# Patient Record
Sex: Female | Born: 1955
Health system: Southern US, Community
[De-identification: ages and names within clinical notes are randomized; demographics above are authoritative.]

## PROBLEM LIST (undated history)

## (undated) DIAGNOSIS — K449 Diaphragmatic hernia without obstruction or gangrene: Secondary | ICD-10-CM

## (undated) DIAGNOSIS — Z87442 Personal history of urinary calculi: Secondary | ICD-10-CM

## (undated) DIAGNOSIS — R011 Cardiac murmur, unspecified: Secondary | ICD-10-CM

## (undated) DIAGNOSIS — F32A Depression, unspecified: Secondary | ICD-10-CM

## (undated) DIAGNOSIS — T7840XA Allergy, unspecified, initial encounter: Secondary | ICD-10-CM

## (undated) DIAGNOSIS — R112 Nausea with vomiting, unspecified: Secondary | ICD-10-CM

## (undated) DIAGNOSIS — T8859XA Other complications of anesthesia, initial encounter: Secondary | ICD-10-CM

## (undated) DIAGNOSIS — M069 Rheumatoid arthritis, unspecified: Secondary | ICD-10-CM

## (undated) DIAGNOSIS — M3500 Sicca syndrome, unspecified: Secondary | ICD-10-CM

## (undated) DIAGNOSIS — E785 Hyperlipidemia, unspecified: Secondary | ICD-10-CM

## (undated) DIAGNOSIS — K219 Gastro-esophageal reflux disease without esophagitis: Secondary | ICD-10-CM

## (undated) DIAGNOSIS — I2510 Atherosclerotic heart disease of native coronary artery without angina pectoris: Secondary | ICD-10-CM

## (undated) DIAGNOSIS — I700 Atherosclerosis of aorta: Secondary | ICD-10-CM

## (undated) DIAGNOSIS — F419 Anxiety disorder, unspecified: Secondary | ICD-10-CM

## (undated) DIAGNOSIS — D649 Anemia, unspecified: Secondary | ICD-10-CM

## (undated) DIAGNOSIS — M1990 Unspecified osteoarthritis, unspecified site: Secondary | ICD-10-CM

## (undated) DIAGNOSIS — M797 Fibromyalgia: Secondary | ICD-10-CM

## (undated) DIAGNOSIS — I10 Essential (primary) hypertension: Secondary | ICD-10-CM

## (undated) DIAGNOSIS — T4145XA Adverse effect of unspecified anesthetic, initial encounter: Secondary | ICD-10-CM

## (undated) DIAGNOSIS — F329 Major depressive disorder, single episode, unspecified: Secondary | ICD-10-CM

## (undated) DIAGNOSIS — Z9889 Other specified postprocedural states: Secondary | ICD-10-CM

## (undated) HISTORY — DX: Anxiety disorder, unspecified: F41.9

## (undated) HISTORY — PX: TONSILLECTOMY: SUR1361

## (undated) HISTORY — DX: Allergy, unspecified, initial encounter: T78.40XA

## (undated) HISTORY — DX: Essential (primary) hypertension: I10

## (undated) HISTORY — DX: Hyperlipidemia, unspecified: E78.5

## (undated) HISTORY — DX: Atherosclerosis of aorta: I70.0

## (undated) HISTORY — DX: Diaphragmatic hernia without obstruction or gangrene: K44.9

## (undated) HISTORY — DX: Sjogren syndrome, unspecified: M35.00

## (undated) HISTORY — DX: Rheumatoid arthritis, unspecified: M06.9

## (undated) HISTORY — DX: Atherosclerotic heart disease of native coronary artery without angina pectoris: I25.10

## (undated) HISTORY — PX: CARDIAC CATHETERIZATION: SHX172

## (undated) HISTORY — PX: ABDOMINAL HYSTERECTOMY: SHX81

## (undated) HISTORY — PX: HERNIA REPAIR: SHX51

## (undated) HISTORY — PX: OOPHORECTOMY: SHX86

## (undated) HISTORY — PX: VESICOVAGINAL FISTULA CLOSURE W/ TAH: SUR271

---

## 2005-02-15 ENCOUNTER — Ambulatory Visit: Payer: Self-pay | Admitting: Gastroenterology

## 2005-05-05 ENCOUNTER — Ambulatory Visit: Payer: Self-pay | Admitting: Internal Medicine

## 2006-05-18 ENCOUNTER — Ambulatory Visit: Payer: Self-pay | Admitting: Internal Medicine

## 2007-12-04 ENCOUNTER — Encounter: Payer: Self-pay | Admitting: Emergency Medicine

## 2007-12-04 ENCOUNTER — Observation Stay (HOSPITAL_COMMUNITY): Admission: AD | Admit: 2007-12-04 | Discharge: 2007-12-05 | Payer: Self-pay | Admitting: Internal Medicine

## 2007-12-10 ENCOUNTER — Ambulatory Visit: Payer: Self-pay | Admitting: Cardiovascular Disease

## 2008-01-18 DIAGNOSIS — R011 Cardiac murmur, unspecified: Secondary | ICD-10-CM

## 2008-01-18 HISTORY — DX: Cardiac murmur, unspecified: R01.1

## 2008-03-16 ENCOUNTER — Observation Stay: Payer: Self-pay | Admitting: Internal Medicine

## 2008-08-26 ENCOUNTER — Ambulatory Visit (HOSPITAL_COMMUNITY): Admission: RE | Admit: 2008-08-26 | Discharge: 2008-08-26 | Payer: Self-pay | Admitting: Gastroenterology

## 2009-03-18 ENCOUNTER — Encounter: Admission: RE | Admit: 2009-03-18 | Discharge: 2009-03-18 | Payer: Self-pay | Admitting: Internal Medicine

## 2009-07-17 ENCOUNTER — Ambulatory Visit: Payer: Self-pay | Admitting: Family Medicine

## 2009-07-17 ENCOUNTER — Ambulatory Visit: Payer: Self-pay | Admitting: Cardiology

## 2009-07-17 DIAGNOSIS — F411 Generalized anxiety disorder: Secondary | ICD-10-CM | POA: Insufficient documentation

## 2009-07-17 DIAGNOSIS — F419 Anxiety disorder, unspecified: Secondary | ICD-10-CM | POA: Insufficient documentation

## 2009-07-17 DIAGNOSIS — K449 Diaphragmatic hernia without obstruction or gangrene: Secondary | ICD-10-CM | POA: Insufficient documentation

## 2009-07-17 DIAGNOSIS — IMO0002 Reserved for concepts with insufficient information to code with codable children: Secondary | ICD-10-CM

## 2009-07-17 DIAGNOSIS — M3500 Sicca syndrome, unspecified: Secondary | ICD-10-CM | POA: Insufficient documentation

## 2009-07-17 DIAGNOSIS — I10 Essential (primary) hypertension: Secondary | ICD-10-CM | POA: Insufficient documentation

## 2009-07-17 DIAGNOSIS — E785 Hyperlipidemia, unspecified: Secondary | ICD-10-CM

## 2009-07-17 DIAGNOSIS — M069 Rheumatoid arthritis, unspecified: Secondary | ICD-10-CM | POA: Insufficient documentation

## 2009-07-17 DIAGNOSIS — R439 Unspecified disturbances of smell and taste: Secondary | ICD-10-CM

## 2009-07-21 ENCOUNTER — Telehealth (INDEPENDENT_AMBULATORY_CARE_PROVIDER_SITE_OTHER): Payer: Self-pay | Admitting: *Deleted

## 2009-07-21 DIAGNOSIS — K7689 Other specified diseases of liver: Secondary | ICD-10-CM | POA: Insufficient documentation

## 2009-07-24 ENCOUNTER — Encounter: Admission: RE | Admit: 2009-07-24 | Discharge: 2009-07-24 | Payer: Self-pay | Admitting: Family Medicine

## 2009-07-27 ENCOUNTER — Ambulatory Visit: Payer: Self-pay | Admitting: Family Medicine

## 2009-07-27 DIAGNOSIS — R1310 Dysphagia, unspecified: Secondary | ICD-10-CM | POA: Insufficient documentation

## 2009-08-04 ENCOUNTER — Ambulatory Visit (HOSPITAL_BASED_OUTPATIENT_CLINIC_OR_DEPARTMENT_OTHER): Admission: RE | Admit: 2009-08-04 | Discharge: 2009-08-04 | Payer: Self-pay | Admitting: Family Medicine

## 2009-08-04 ENCOUNTER — Ambulatory Visit: Payer: Self-pay | Admitting: Diagnostic Radiology

## 2009-08-05 ENCOUNTER — Telehealth (INDEPENDENT_AMBULATORY_CARE_PROVIDER_SITE_OTHER): Payer: Self-pay | Admitting: *Deleted

## 2009-08-07 ENCOUNTER — Ambulatory Visit: Payer: Self-pay | Admitting: Pulmonary Disease

## 2009-08-07 DIAGNOSIS — R0602 Shortness of breath: Secondary | ICD-10-CM

## 2009-09-01 ENCOUNTER — Encounter: Payer: Self-pay | Admitting: Family Medicine

## 2009-10-05 ENCOUNTER — Ambulatory Visit: Payer: Self-pay | Admitting: Family Medicine

## 2009-10-05 DIAGNOSIS — B029 Zoster without complications: Secondary | ICD-10-CM | POA: Insufficient documentation

## 2009-10-06 ENCOUNTER — Encounter: Payer: Self-pay | Admitting: Family Medicine

## 2009-10-14 ENCOUNTER — Telehealth (INDEPENDENT_AMBULATORY_CARE_PROVIDER_SITE_OTHER): Payer: Self-pay | Admitting: *Deleted

## 2009-10-26 ENCOUNTER — Telehealth: Payer: Self-pay | Admitting: Internal Medicine

## 2009-11-17 ENCOUNTER — Ambulatory Visit (HOSPITAL_BASED_OUTPATIENT_CLINIC_OR_DEPARTMENT_OTHER): Admission: RE | Admit: 2009-11-17 | Discharge: 2009-11-17 | Payer: Self-pay | Admitting: Plastic Surgery

## 2009-11-17 HISTORY — PX: REDUCTION MAMMAPLASTY: SUR839

## 2009-11-24 ENCOUNTER — Telehealth (INDEPENDENT_AMBULATORY_CARE_PROVIDER_SITE_OTHER): Payer: Self-pay | Admitting: *Deleted

## 2010-01-29 ENCOUNTER — Encounter: Payer: Self-pay | Admitting: Family Medicine

## 2010-01-29 ENCOUNTER — Encounter
Admission: RE | Admit: 2010-01-29 | Discharge: 2010-01-29 | Payer: Self-pay | Source: Home / Self Care | Attending: Family Medicine | Admitting: Family Medicine

## 2010-01-29 ENCOUNTER — Telehealth (INDEPENDENT_AMBULATORY_CARE_PROVIDER_SITE_OTHER): Payer: Self-pay | Admitting: *Deleted

## 2010-01-29 ENCOUNTER — Other Ambulatory Visit: Payer: Self-pay | Admitting: Family Medicine

## 2010-01-29 ENCOUNTER — Ambulatory Visit
Admission: RE | Admit: 2010-01-29 | Discharge: 2010-01-29 | Payer: Self-pay | Source: Home / Self Care | Attending: Family Medicine | Admitting: Family Medicine

## 2010-01-29 DIAGNOSIS — R011 Cardiac murmur, unspecified: Secondary | ICD-10-CM | POA: Insufficient documentation

## 2010-01-29 DIAGNOSIS — R079 Chest pain, unspecified: Secondary | ICD-10-CM | POA: Insufficient documentation

## 2010-01-29 LAB — BASIC METABOLIC PANEL
BUN: 14 mg/dL (ref 6–23)
CO2: 28 mEq/L (ref 19–32)
Calcium: 9.7 mg/dL (ref 8.4–10.5)
Chloride: 100 mEq/L (ref 96–112)
Creatinine, Ser: 0.6 mg/dL (ref 0.4–1.2)
GFR: 134.71 mL/min (ref >60.00)
Glucose, Bld: 77 mg/dL (ref 70–99)
Potassium: 5 mEq/L (ref 3.5–5.1)
Sodium: 136 mEq/L (ref 135–145)

## 2010-01-29 LAB — CBC WITH DIFFERENTIAL/PLATELET
Basophils Absolute: 0 10*3/uL (ref 0.0–0.1)
Basophils Relative: 0.4 % (ref 0.0–3.0)
Eosinophils Absolute: 0 10*3/uL (ref 0.0–0.7)
Eosinophils Relative: 0.7 % (ref 0.0–5.0)
HCT: 36.5 % (ref 36.0–46.0)
Hemoglobin: 12.3 g/dL (ref 12.0–15.0)
Lymphocytes Relative: 24 % (ref 12.0–46.0)
Lymphs Abs: 1.2 10*3/uL (ref 0.7–4.0)
MCHC: 33.7 g/dL (ref 30.0–36.0)
MCV: 86.1 fl (ref 78.0–100.0)
Monocytes Absolute: 0.8 10*3/uL (ref 0.1–1.0)
Monocytes Relative: 15.7 % — ABNORMAL HIGH (ref 3.0–12.0)
Neutro Abs: 2.9 10*3/uL (ref 1.4–7.7)
Neutrophils Relative %: 59.2 % (ref 43.0–77.0)
Platelets: 245 10*3/uL (ref 150.0–400.0)
RBC: 4.24 Mil/uL (ref 3.87–5.11)
RDW: 13.3 % (ref 11.5–14.6)
WBC: 4.9 10*3/uL (ref 4.5–10.5)

## 2010-01-29 LAB — HEPATIC FUNCTION PANEL
ALT: 65 U/L — ABNORMAL HIGH (ref 0–35)
AST: 57 U/L — ABNORMAL HIGH (ref 0–37)
Albumin: 3.9 g/dL (ref 3.5–5.2)
Alkaline Phosphatase: 88 U/L (ref 39–117)
Bilirubin, Direct: 0 mg/dL (ref 0.0–0.3)
Total Bilirubin: 0.5 mg/dL (ref 0.3–1.2)
Total Protein: 9.2 g/dL — ABNORMAL HIGH (ref 6.0–8.3)

## 2010-01-29 LAB — CARDIAC PANEL
CK-MB: 0.5 ng/mL (ref 0.3–4.0)
Relative Index: 1 calc (ref 0.0–2.5)
Total CK: 50 U/L (ref 7–177)

## 2010-01-29 LAB — TSH: TSH: 2.56 u[IU]/mL (ref 0.35–5.50)

## 2010-02-02 ENCOUNTER — Ambulatory Visit: Admit: 2010-02-02 | Payer: Self-pay

## 2010-02-02 ENCOUNTER — Telehealth: Payer: Self-pay | Admitting: Family Medicine

## 2010-02-02 ENCOUNTER — Encounter (INDEPENDENT_AMBULATORY_CARE_PROVIDER_SITE_OTHER): Payer: Self-pay | Admitting: *Deleted

## 2010-02-14 LAB — CONVERTED CEMR LAB
BUN: 12 mg/dL (ref 6–23)
Basophils Relative: 1 % (ref 0.0–3.0)
Bilirubin, Direct: 0.1 mg/dL (ref 0.0–0.3)
Calcium: 9.9 mg/dL (ref 8.4–10.5)
Chloride: 104 meq/L (ref 96–112)
Creatinine, Ser: 0.7 mg/dL (ref 0.4–1.2)
Direct LDL: 152.3 mg/dL
Eosinophils Absolute: 0.1 10*3/uL (ref 0.0–0.7)
GFR calc non Af Amer: 105.97 mL/min (ref >60)
Glucose, Bld: 90 mg/dL (ref 70–99)
HCV Ab: NEGATIVE
HDL: 50.4 mg/dL (ref >39.00)
Hemoglobin: 12.6 g/dL (ref 12.0–15.0)
MCHC: 33.4 g/dL (ref 30.0–36.0)
MCV: 85.8 fL (ref 78.0–100.0)
Neutro Abs: 3.1 10*3/uL (ref 1.4–7.7)
Potassium: 4.6 meq/L (ref 3.5–5.1)
Total CHOL/HDL Ratio: 4

## 2010-02-16 NOTE — Progress Notes (Signed)
Summary: triage  Phone Note Call from Patient Call back at [phone]  (cell)   Caller: Patient Call For: Dr. Leone Payor Reason for Call: Talk to Nurse Summary of Call: pt sch'ed with wrong physician- is a Dr. Leone Payor pt... however, pt doesnt want to wait until November to be seen... coming in for hiatal hernia, referred by Dr. Beverely Low... pt said she is NOT ok to see Amy or Gunnar Fusi, only a doctor Initial call taken by: Vallarie Mare,  October 26, 2009 2:30 PM  Follow-up for Phone Call        Patient  is rescheduled to 10/113/11 8:30.  Patient  aware. Follow-up by: Darcey Nora RN, CGRN,  October 26, 2009 3:16 PM

## 2010-02-16 NOTE — Assessment & Plan Note (Signed)
Summary: rot 3 weeks/cbs   Vital Signs:  Patient profile:   55 year old female Weight:      193 pounds O2 Sat:      98 % on Room air Pulse rate:   103 / minute BP sitting:   114 / 80  (left arm)  Vitals Entered By: Doristine Devoid (July 27, 2009 2:20 PM)  O2 Flow:  Room air CC: roa-   History of Present Illness: 55 yo woman here today for discussion on recent imaging.    1) dysphagia- difficulty swallowing solid food.  will occasionally cough up food, pills.  will try and massage food/meds down.  no N/V.  2) hiatal hernia- both stomach and liver protruding into hernia.  would like to see surgeon about this.  having shortness of breath.  3) back pain- vertebrae under bra is TTP.  has yet to get CXR which would show compression fx, none seen on recent CT.  pain will radiate around from center of back along L side along rib.  4) hyperlipidemia- pt woul like to work on diet and exercise for next 6 months.  reviewed labs w/ her in office.  Problems Prior to Update: 1)  Fatty Liver Disease  (ICD-571.8) 2)  Compression Fracture, Spine  (ICD-805.8) 3)  Anosmia  (ICD-781.1) 4)  Cough  (ICD-786.2) 5)  Hypertension  (ICD-401.9) 6)  Hyperlipidemia  (ICD-272.4) 7)  Arthritis, Rheumatoid  (ICD-714.0) 8)  Hiatal Hernia  (ICD-553.3) 9)  Sjogren's Syndrome  (ICD-710.2) 10)  Anxiety  (ICD-300.00)  Current Medications (verified): 1)  Lisinopril-Hydrochlorothiazide 20-25 Mg Tabs (Lisinopril-Hydrochlorothiazide) .... Take One Tablet Daily 2)  Nexium 40 Mg Cpdr (Esomeprazole Magnesium) .... Take One Tablet Daily 3)  Multivitamin & Mineral  Liqd (Multiple Vitamins-Minerals) .... Take Daily 4)  Mirtazapine 15 Mg Tabs (Mirtazapine) .... Take One Tablet At Bedtime 5)  Hydroxychloroquine Sulfate 200 Mg Tabs (Hydroxychloroquine Sulfate) .... 2 Tablets Daily 6)  Lidoderm 5 % Ptch (Lidocaine) .Marland Kitchen.. 1 Patch As Needed 7)  Nasonex 50 Mcg/act Susp (Mometasone Furoate) .... 2 Sprays Each Nostril Once  Daily 8)  Vicodin 5-500 Mg Tabs (Hydrocodone-Acetaminophen) .Marland Kitchen.. 1 Tab By Mouth At Bedtime As Needed For Pain.  Allergies (verified): No Known Drug Allergies  Past History:  Past Medical History: Last updated: 07/17/2009 Sjogren's syndrome Hiatal Hernia Rhematoid Arthritis Anxiety Hyperlipidemia Hypertension  Review of Systems      See HPI  Physical Exam  General:  Well-developed,well-nourished,in no acute distress; alert,appropriate and cooperative throughout examination Heart:  Normal rate and regular rhythm. S1 and S2 normal without gallop, murmur, click, rub or other extra sounds. Abdomen:  soft, NT/ND, +BS Msk:  + TTP over thoracic vertebrae and along L rib Psych:  Cognition and judgment appear intact. Alert and cooperative with normal attention span and concentration. No apparent delusions, illusions, hallucinations   Impression & Recommendations:  Problem # 1:  HIATAL HERNIA (ICD-553.3) Assessment Unchanged reviewed CT w/ pt.  both stomach and liver protruding through hernia.  refer to surgery. Orders: Surgical Referral (Surgery)  Problem # 2:  COMPRESSION FRACTURE, SPINE (ICD-805.8) Assessment: Unchanged pt again reports this is an ongoing issue although not seen on recent CT.  pt has xray pending.  will refer to ortho for evaluation and management.  start Vicodin as needed. Orders: Orthopedic Referral (Ortho)  Problem # 3:  DYSPHAGIA (ICD-787.20) Assessment: New pt having difficulty swallowing solids.  will actually cough up food and medication.  refer to GI. Orders: Gastroenterology Referral (GI)  Problem #  4:  HYPERLIPIDEMIA (ICD-272.4) Assessment: Unchanged total cholesterol and LDL are both elevated.  pt will attempt to control this w/ diet and exercise over the next 6 months.  will follow.  Complete Medication List: 1)  Lisinopril-hydrochlorothiazide 20-25 Mg Tabs (Lisinopril-hydrochlorothiazide) .... Take one tablet daily 2)  Nexium 40 Mg Cpdr  (Esomeprazole magnesium) .... Take one tablet daily 3)  Multivitamin & Mineral Liqd (Multiple vitamins-minerals) .... Take daily 4)  Mirtazapine 15 Mg Tabs (Mirtazapine) .... Take one tablet at bedtime 5)  Hydroxychloroquine Sulfate 200 Mg Tabs (Hydroxychloroquine sulfate) .... 2 tablets daily 6)  Lidoderm 5 % Ptch (Lidocaine) .Marland Kitchen.. 1 patch as needed 7)  Nasonex 50 Mcg/act Susp (Mometasone furoate) .... 2 sprays each nostril once daily 8)  Vicodin 5-500 Mg Tabs (Hydrocodone-acetaminophen) .Marland Kitchen.. 1 tab by mouth at bedtime as needed for pain.  Patient Instructions: 1)  Someone will call you with your ortho, GI, and surgery appts 2)  We'll determine when you need to follow up with me once you see everyone else 3)  Take the Vicodin as needed for severe pain 4)  Use a heating pad as needed for pain 5)  Call with any questions or concerns 6)  Hang in there! Prescriptions: VICODIN 5-500 MG TABS (HYDROCODONE-ACETAMINOPHEN) 1 tab by mouth at bedtime as needed for pain.  #20 x 0   Entered and Authorized by:   Neena Rhymes MD   Signed by:   Neena Rhymes MD on 07/27/2009   Method used:   Print then Give to Patient   RxID:   [id number]

## 2010-02-16 NOTE — Assessment & Plan Note (Signed)
Summary: RASH/POSSIBLE SHINGLES/KB   Vital Signs:  Patient profile:   55 year old [name] Height:      Leslie inches (162.56 cm) Weight:      190.13 pounds (86.42 kg) BMI:     32.75 Temp:     97.0 degrees F (36.11 degrees C) oral BP sitting:   130 / 88  (left arm) Cuff size:   large  Vitals Entered By: Lucious Groves CMA (October 05, 2009 9:46 AM) CC: C/O recurrent rash/ possibly shingles./kb Is Patient Diabetic? No Pain Assessment Patient in pain? yes     Location: buttock Intensity: 6 Type: aching Onset of pain  Since Saturday (3 days), right gluteus is "tingling" Comments Patient notes that a previous MD made her aware that she had shingles, but this rash is painful, raised, and in a different area. She also notes that she is out of Nasonex./kb   History of Present Illness: 55 yo woman here today for ? shingles.  sxs started Saturday.  hx of shingles in the past.  R buttock, near tailbone.  pain is burning, tingling pain.  some relief w/ advil.  'i knew i had to come in right away'.  Current Medications (verified): 1)  Lisinopril-Hydrochlorothiazide 20-25 Mg Tabs (Lisinopril-Hydrochlorothiazide) .... Take One Tablet Daily 2)  Nexium 40 Mg Cpdr (Esomeprazole Magnesium) .... Take One Tablet Daily 3)  Multivitamin & Mineral  Liqd (Multiple Vitamins-Minerals) .... Take Daily 4)  Mirtazapine 15 Mg Tabs (Mirtazapine) .... Take One Tablet At Bedtime 5)  Hydroxychloroquine Sulfate 200 Mg Tabs (Hydroxychloroquine Sulfate) .... 2 Tablets Daily 6)  Nasonex 50 Mcg/act Susp (Mometasone Furoate) .... 2 Sprays Each Nostril Once Daily 7)  Vicodin 5-500 Mg Tabs (Hydrocodone-Acetaminophen) .Marland Kitchen.. 1 Tab By Mouth At Bedtime As Needed For Pain.  Allergies (verified): No Known Drug Allergies  Review of Systems      See HPI  Physical Exam  General:  Well-developed,well-nourished,in no acute distress; alert,appropriate and cooperative throughout examination Skin:  cluster of pus filled vesicles  superior to gluteal fold, just R of midline.  no surrounding erythema but mild induration.   Impression & Recommendations:  Problem # 1:  SHINGLES (ICD-053.9) Assessment New pt w/ apparent shingles.  have concerns for superimposed infxn given pus and induration.  will send both viral cx to confirm shingles and would cx to confirm infxn (appears to be MRSA).  start Valtrex and Doxy to cover both.  reviewed supportive care and red flags that should prompt return.  Pt expresses understanding and is in agreement w/ this plan. Orders: Specimen Handling ([id number]) T-Culture, Wound ([id number])  Complete Medication List: 1)  Lisinopril-hydrochlorothiazide 20-25 Mg Tabs (Lisinopril-hydrochlorothiazide) .... Take one tablet daily 2)  Nexium 40 Mg Cpdr (Esomeprazole magnesium) .... Take one tablet daily 3)  Multivitamin & Mineral Liqd (Multiple vitamins-minerals) .... Take daily 4)  Mirtazapine 15 Mg Tabs (Mirtazapine) .... Take one tablet at bedtime 5)  Hydroxychloroquine Sulfate 200 Mg Tabs (Hydroxychloroquine sulfate) .... 2 tablets daily 6)  Nasonex 50 Mcg/act Susp (Mometasone furoate) .... 2 sprays each nostril once daily 7)  Vicodin 5-500 Mg Tabs (Hydrocodone-acetaminophen) .Marland Kitchen.. 1 tab by mouth at bedtime as needed for pain. 8)  Doxycycline Hyclate 100 Mg Caps (Doxycycline hyclate) .... Take 1 tab twice a day.  take w/ food to avoid upset stomach. 9)  Valtrex 1 Gm Tabs (Valacyclovir hcl) .Marland Kitchen.. 1 tab by mouth three times a day x7 days  Patient Instructions: 1)  Follow up just after the new year to recheck  cholesterol and blood pressure 2)  Take the Valtrex as directed for the shingles 3)  Take the Doxycycline as directed for possible infection 4)  Take tylenol/ibuprofen as needed for pain relief- if you need something stronger, call me 5)  Hang in there!! Prescriptions: VALTREX 1 GM TABS (VALACYCLOVIR HCL) 1 tab by mouth three times a day x7 days  #21 x 0   Entered and Authorized by:    Neena Rhymes MD   Signed by:   Neena Rhymes MD on 10/05/2009   Method used:   Electronically to        [facility]  [place] (retail)       [address]       [place], [place]  [address]       Ph: [phone] or [phone]       Fax: [phone]   RxID:   [id number] DOXYCYCLINE HYCLATE 100 MG CAPS (DOXYCYCLINE HYCLATE) Take 1 tab twice a day.  take w/ food to avoid upset stomach.  #20 x 0   Entered and Authorized by:   Neena Rhymes MD   Signed by:   Neena Rhymes MD on 10/05/2009   Method used:   [facility]        [facility]  [place] (retail)       [address]       [place], [place]  [address]       Ph: [phone] or [phone]       Fax: [phone]   RxID:   [id number]

## 2010-02-16 NOTE — Assessment & Plan Note (Signed)
Summary: new to estab/cbs   Vital Signs:  Patient profile:   55 year old female Height:      64 inches Weight:      192 pounds BMI:     33.08 Pulse rate:   90 / minute BP sitting:   130 / 76  (left arm)  Vitals Entered By: Doristine Devoid (July 17, 2009 10:31 AM) CC: cough xjan. productive hurt on L side of back to lay down    History of Present Illness: [name] here today to establish care.  recently moved from [place] area.  Previous MD-   1) Cough- started in Jan.  associated w/ CP, SOB.  productive of thick, yellow sputum.  pain starts in sternum, radiates under L breast and travels around to center of the back.  unable to lie on L side due to pain.    2) Sjogren's- has been following at [facility].  would like a local rheum.  on Plaquenil.    3) anxiety/panic d/o- previously on Xanax, now on Mirtazapine for sleep.  previously on FMLA.  would prefer to avoid meds.  has been walking regularly as a stress outlet.  4) HTN- Lisinopril HCT.  having CP (see above)  5) Hyperlipidemia- checked labs and was high, on recheck was normal.  will need f/u labs.  6) RA- on Plaquenil.  pain relatively well controlled.  7) Spinal fx- was told she had compression fxs in 2010, never followed up.  8) Anosmia- reports this has been going on for months.  wants to know if this is related to sjogren's.  Current Medications (verified): 1)  Lisinopril-Hydrochlorothiazide 20-25 Mg Tabs (Lisinopril-Hydrochlorothiazide) .... Take One Tablet Daily 2)  Nexium 40 Mg Cpdr (Esomeprazole Magnesium) .... Take One Tablet Daily 3)  Multivitamin & Mineral  Liqd (Multiple Vitamins-Minerals) .... Take Daily 4)  Mirtazapine 15 Mg Tabs (Mirtazapine) .... Take One Tablet At Bedtime 5)  Hydroxychloroquine Sulfate 200 Mg Tabs (Hydroxychloroquine Sulfate) .... 2 Tablets Daily 6)  Lidoderm 5 % Ptch (Lidocaine) .Marland Kitchen.. 1 Patch As Needed 7)  Nasonex 50 Mcg/act Susp (Mometasone Furoate) .... 2 Sprays Each Nostril Once  Daily  Allergies (verified): No Known Drug Allergies  Past History:  Past Medical History: Sjogren's syndrome Hiatal Hernia Rhematoid Arthritis Anxiety Hyperlipidemia Hypertension  Past Surgical History: Oophorectomy-1 ovary remain Hysterectomy  Family History: CAD-mother-stents, father MI, paternal grandmother MI HTN-mother,father DM-mother STROKE-maternal grandmother COLON CA-no BREAST CA-maternal aunt  Social History: works for [employer]  Review of Systems      See HPI  Physical Exam  General:  Well-developed,well-nourished,in no acute distress; alert,appropriate and cooperative throughout examination Nose:  External nasal examination shows no deformity or inflammation. Nasal mucosa are pink and moist without lesions or exudates. Neck:  No deformities, masses, or tenderness noted. Chest Wall:  + TTP along ribs in midaxillary line Lungs:  + hacking cough CTAB Heart:  Normal rate and regular rhythm. S1 and S2 normal without gallop, murmur, click, rub or other extra sounds. Msk:  full ROM of shoulders bilaterally good flexion and extension of back Pulses:  +2 carotid, radial, DP Neurologic:  No cranial nerve deficits noted. Station and gait are normal. Plantar reflexes are down-going bilaterally. DTRs are symmetrical throughout. Sensory, motor and coordinative functions appear intact.   Impression & Recommendations:  Problem # 1:  COUGH (ICD-786.2) Assessment New pt reports productive cough since Jan.  no fevers or systemic signs of infxn.  will get CXR, chest CT and refer  to pulm for high suspicion that she has lung involvement of sjogrens. Orders: T-2 View CXR (71020TC) Radiology Referral (Radiology) Pulmonary Referral (Pulmonary)  Problem # 2:  COMPRESSION FRACTURE, SPINE (ICD-805.8) Assessment: New this is by pt report.  should be visible on CT scan.  once level is determined will decide how to proceed.  Problem # 3:  SJOGREN'S SYNDROME  (ICD-710.2) Assessment: New needs local radiologist.  given that she likely has pulmonary involvement her sxs are not well controlled. Orders: Radiology Referral (Radiology) Pulmonary Referral (Pulmonary) Rheumatology Referral (Rheumatology)  Problem # 4:  ARTHRITIS, RHEUMATOID (ICD-714.0) Assessment: New had + labs from [facility], will refer to rheum. Orders: Rheumatology Referral (Rheumatology)  Problem # 5:  ANOSMIA (ICD-781.1) Assessment: New also likely related to Sjogrens.  may need to get head CT/MRI but pt's neuro exam WNL.  will follow.  Problem # 6:  HYPERTENSION (ICD-401.9) Assessment: New BP adequately controlled.  check labs.  EKG WNL. Her updated medication list for this problem includes:    Lisinopril-hydrochlorothiazide 20-25 Mg Tabs (Lisinopril-hydrochlorothiazide) .Marland Kitchen... Take one tablet daily  Orders: EKG w/ Interpretation (93000) TLB-BMP (Basic Metabolic Panel-BMET) (80048-METABOL) TLB-CBC Platelet - w/Differential (85025-CBCD) TLB-TSH (Thyroid Stimulating Hormone) (84443-TSH)  Problem # 7:  HYPERLIPIDEMIA (ICD-272.4) Assessment: New check labs and start meds as needed. Orders: Venipuncture (65784) TLB-Lipid Panel (80061-LIPID) TLB-Hepatic/Liver Function Pnl (80076-HEPATIC)  Problem # 8:  ANXIETY (ICD-300.00) Assessment: New pt trying to control sxs w/out medication.  using exercise as her stress outlet. Her updated medication list for this problem includes:    Mirtazapine 15 Mg Tabs (Mirtazapine) .Marland Kitchen... Take one tablet at bedtime  Complete Medication List: 1)  Lisinopril-hydrochlorothiazide 20-25 Mg Tabs (Lisinopril-hydrochlorothiazide) .... Take one tablet daily 2)  Nexium 40 Mg Cpdr (Esomeprazole magnesium) .... Take one tablet daily 3)  Multivitamin & Mineral Liqd (Multiple vitamins-minerals) .... Take daily 4)  Mirtazapine 15 Mg Tabs (Mirtazapine) .... Take one tablet at bedtime 5)  Hydroxychloroquine Sulfate 200 Mg Tabs (Hydroxychloroquine sulfate)  .... 2 tablets daily 6)  Lidoderm 5 % Ptch (Lidocaine) .Marland Kitchen.. 1 patch as needed 7)  Nasonex 50 Mcg/act Susp (Mometasone furoate) .... 2 sprays each nostril once daily  Patient Instructions: 1)  Please schedule a f/u appt in 2-3 weeks to review your results 2)  Please go to [address] [facility] on [address] to get your chest xray 3)  Someone will call you with your pulmonary and rheumatology appts 4)  We'll notify you of your lab results 5)  We are working on scheduling your CT scan- please wait here and someone will give you your appt 6)  Start the nasal steroid spray- 2 sprays each nostril daily 7)  Call with any questions or concerns 8)  Hang in there! Prescriptions: NASONEX 50 MCG/ACT SUSP (MOMETASONE FUROATE) 2 sprays each nostril once daily  #1 x 3   Entered and Authorized by:   Neena Rhymes MD   Signed by:   Neena Rhymes MD on 07/17/2009   Method used:   Print then Give to Patient   RxID:   [id number]

## 2010-02-16 NOTE — Consult Note (Signed)
Summary: [facility]  [facility]   Imported By: Lanelle Bal 09/10/2009 11:38:26  _____________________________________________________________________  External Attachment:    Type:   Image     Comment:   External Document

## 2010-02-16 NOTE — Assessment & Plan Note (Signed)
Summary: persistant cough/concern for SJOGREN's Lung Inv./apc   Visit Type:  Initial Consult Copy to:  Beverely Low Primary Provider/Referring Provider:  Neena Rhymes MD  CC:  Pulmonary consult for cough.The patient c/o productive cough off and on since January 2011. Patient says mucus is thick and yellow. Also sob with exertion.Marland Kitchen  History of Present Illness: 55/F never smoker with Sjogren's & RA for evaluation of dyspnea on exertion. She reports a band like pain in her chest & middle back x 3 mnths, with dyspne aon exertion & cough productive of yellow thick mucus especially in am. She can walk 1/2 mile in the park. Her rheumatologist is Dr Carley Hammed at [facility]. CXR 08/04/09 was wnl. Ctc hest 07/17/09 showed mild central bronchiectasis with interstitio-alveolar infiltrate in medial right lung base. Small hiatal hernia & fatty liver were noted. Lisinopril is noted onmed review, stable on this x 5 yrs. Does admit to symptoms of anxiety  Preventive Screening-Counseling & Management  Alcohol-Tobacco     Alcohol drinks/day: 0     Smoking Status: never  Current Medications (verified): 1)  Lisinopril-Hydrochlorothiazide 20-25 Mg Tabs (Lisinopril-Hydrochlorothiazide) .... Take One Tablet Daily 2)  Nexium 40 Mg Cpdr (Esomeprazole Magnesium) .... Take One Tablet Daily 3)  Multivitamin & Mineral  Liqd (Multiple Vitamins-Minerals) .... Take Daily 4)  Mirtazapine 15 Mg Tabs (Mirtazapine) .... Take One Tablet At Bedtime 5)  Hydroxychloroquine Sulfate 200 Mg Tabs (Hydroxychloroquine Sulfate) .... 2 Tablets Daily 6)  Nasonex 50 Mcg/act Susp (Mometasone Furoate) .... 2 Sprays Each Nostril Once Daily 7)  Vicodin 5-500 Mg Tabs (Hydrocodone-Acetaminophen) .Marland Kitchen.. 1 Tab By Mouth At Bedtime As Needed For Pain.  Allergies (verified): No Known Drug Allergies  Past History:  Past Medical History: Last updated: 07/17/2009 Sjogren's syndrome Hiatal Hernia Rhematoid  Arthritis Anxiety Hyperlipidemia Hypertension  Past Surgical History: Last updated: 07/17/2009 Oophorectomy-1 ovary remain Hysterectomy  Family History: Last updated: 07/17/2009 CAD-mother-stents, father MI, paternal grandmother MI HTN-mother,father DM-mother STROKE-maternal grandmother COLON CA-no BREAST CA-maternal aunt  Social History: Last updated: 08/07/2009 works for [employer] Patient never smoked.  Married  Social History: works for [employer] Patient never smoked.  MarriedAlcohol drinks/day:  0 Smoking Status:  never  Review of Systems       The patient complains of shortness of breath with activity, shortness of breath at rest, productive cough, non-productive cough, irregular heartbeats, acid heartburn, difficulty swallowing, nasal congestion/difficulty breathing through nose, anxiety, and joint stiffness or pain.  The patient denies coughing up blood, chest pain, indigestion, loss of appetite, weight change, abdominal pain, sore throat, tooth/dental problems, headaches, sneezing, itching, ear ache, depression, hand/feet swelling, rash, change in color of mucus, and fever.    Vital Signs:  Patient profile:   55 year old female Height:      64 inches (162.56 cm) Weight:      197 pounds (89.55 kg) BMI:     33.94 O2 Sat:      100 % on Room air Temp:     97.8 degrees F (36.56 degrees C) oral Pulse rate:   81 / minute BP sitting:   134 / 90  (right arm) Cuff size:   regular  Vitals Entered By: Michel Bickers CMA (August 07, 2009 3:38 PM)  O2 Sat at Rest %:  100 O2 Flow:  Room air CC: Pulmonary consult for cough.The patient c/o productive cough off and on since January 2011. Patient says mucus is thick and yellow. Also sob with exertion. Is Patient Diabetic? No Comments Medications  reviewed with the patient. Daytime phone verified. Michel Bickers CMA  August 07, 2009 3:52 PM   Physical Exam  Additional Exam:  Gen. Pleasant, well-nourished, in no  distress, normal affect ENT - no lesions, no post nasal drip Neck: No JVD, no thyromegaly, no carotid bruits Lungs: no use of accessory muscles, no dullness to percussion, clear without rales or rhonchi  Cardiovascular: Rhythm regular, heart sounds  normal, no murmurs or gallops, no peripheral edema Abdomen: soft and non-tender, no hepatosplenomegaly, BS normal. Musculoskeletal: No deformities, no cyanosis or clubbing Neuro:  alert, non focal     Impression & Recommendations:  Problem # 1:  DYSPNEA (ICD-786.05)  Obtian PFTs from Dr Carley Hammed & review. Not impressed by the degree of pulmonary infiltrates Bronchiectasis appears to be mild & we discussed plan for exacerbation. No evidence of pulmonary hypertension. Re-assess in 6 mnths   Orders: Consultation Level IV (29562)  Patient Instructions: 1)  Copy sent to: Dr Beverely Low 2)  Please schedule a follow-up appointment in 6 months. 3)  We will try to obtain PFTs fro Dr Carley Hammed 4)  You have MILD bronchiectasis  Appended Document: persistant cough/concern for SJOGREN's Lung Inv./apc obtain PFTs from [facility]  - Dr Carley Hammed - rheum  Appended Document: persistant cough/concern for SJOGREN's Lung Inv./apc Record release sent by Michel Bickers. jwr

## 2010-02-16 NOTE — Progress Notes (Signed)
Summary: ct report and labs  Phone Note Outgoing Call   Call placed by: Doristine Devoid,  July 21, 2009 11:58 AM Call placed to: Patient Summary of Call: labs normal w/ exception of cholesterol, LDL, and elevated LFTs.  pt will need hepatitis panel.  pt has pulmonary appt upcoming for likely lung involvement of her Sjogren's.  Given fatty liver should have abd [name] and depending on lab results possibly a hepatitis panel.  no compression fractures seen after reviewing w/ radiologist.  based on appearance of liver and abnormal LFTs needs hepatitis panel.  negative for acute hepatitis.  proceed w/ [name]  Follow-up for Phone Call        left msg w/ female to have patient return call.........Marland KitchenDoristine Devoid  July 21, 2009 12:00PM   Additional Follow-up for Phone Call Additional follow up Details #1::        Pt notified of results and would like to proceed with [place].  Is there an order in her chart for this? Pt would like to be called back at wk # [phone] for any additional communication.  Nicki Guadalajara Fergerson CMA (AAMA)  July 21, 2009 4:18 PM   New Problems: FATTY LIVER DISEASE (ICD-571.8)   New Problems: FATTY LIVER DISEASE (ICD-571.8)

## 2010-02-16 NOTE — Progress Notes (Signed)
Summary: need info about referral  Phone Note Other Incoming   Caller: Jessye at [facility] at DR Sheppard Penton 's office Summary of Call: I got a call around lunchtime today from Jasper at [facility] (Dr Efraim Kaufmann was calling to request clinic notes and labs and the reason patient needs an EGD--upper endoscopy---she did not have paperwork showing who referred patient to [facility]  I spoke to [facility] and neither one of [name] can find a referral for this patient to see Dr Sheppard Penton at [facility]----Renee says if Dr Leone Payor or Dr Christella Hartigan had referred patient--that their referral should show up  [facility] phone = [phone]     [facility] fax = ATTN: Marko Stai   [phone]  she says patient is scheduled for endo on 11/11 Initial call taken by: Jerolyn Shin,  November 24, 2009 4:00 PM  Follow-up for Phone Call        Archie Patten back she has already left for today, inform receptionist that in order for [name] to release record pt will need to sign a release of records since we did not refer her to them. Per receptionist will inform jessye in AM and have her fax a  release if records are still needed...........Marland KitchenFelecia Deloach CMA  November 24, 2009 4:40 PM

## 2010-02-16 NOTE — Progress Notes (Signed)
Summary: lab results  Phone Note Outgoing Call   Call placed by: [facility] CMA,  October 14, 2009 2:27 PM Details for Reason: shingles identified.  no evidence of superimposed infection  Summary of Call: left message to call office .................Marland KitchenFelecia Deloach CMA  October 14, 2009 2:28 PM   discuss with patient ...............Marland KitchenFelecia Deloach CMA  October 14, 2009 3:32 PM

## 2010-02-16 NOTE — Progress Notes (Signed)
Summary: xray results  Phone Note Outgoing Call   Summary of Call: no acute process identified.  has appt w/ pulm upcoming  Left message on machine to call back to office.  Signed by Lucious Groves CMA on 08/05/2009 at 8:54 AM   Follow-up for Phone Call        Patient is aware of results. Follow-up by: Harold Barban,  August 05, 2009 1:35 PM

## 2010-02-18 NOTE — Letter (Signed)
Summary: Generic Letter  [facility] [place]  [address] [place], [place] [address]   Phone: [phone]  Fax: [phone]    02/02/2010  [name] [address] [place], [place]  [address] DOB 27-Aug-2055 VH:[id number]  ATTENTION PREDETERMINATION DEPARTMENT:  This Patient has had two diagnosis of shingles in the past six months. The initial diagnosis occurred on 10-05-09 and then a second episode on 01-29-10. In effort to prevent future infections the patient is strongly urge to have the Zostavax vaccine.The patient and physician are in agreement that this vaccine would play a vital roll in any future infections. Therefore we are asking that you as the insurance company please allow coverage of the vaccine so that the patient can prevent any future infections.         Sincerely,      Neena Rhymes, MD

## 2010-02-18 NOTE — Assessment & Plan Note (Signed)
Summary: shingle, light headed//fd   Vital Signs:  Patient profile:   55 year old female Weight:      187.0 pounds Temp:     98.3 degrees F oral Pulse rate:   88 / minute Pulse rhythm:   regular BP sitting:   130 / 78  (right arm) Cuff size:   large  Vitals Entered By: Almeta Monas CMA Duncan Dull) (January 29, 2010 10:58 AM) CC: c/o shingles,being lighthead, dizziness, legs tingling, chest pain   History of Present Illness:       This is a 55 year old woman who presents with Chest Pain.  The symptoms began <4 hrs ago.  Pt here c/o chest pain and increased stress.  Pt with hx anxiety/panic attack.  The patient reports resting chest pain, palpitations, dizziness, and light headedness, but denies exertional chest pain, nausea, vomiting, diaphoresis, shortness of breath, syncope, and indigestion.  The pain is described as intermittent and sharp.  The pain is located in the left upper chest.  Episodes of chest pain last < 1 minute.    Problems Prior to Update: 1)  Shingles  (ICD-053.9) 2)  Dyspnea  (ICD-786.05) 3)  Dysphagia  (ICD-787.20) 4)  Fatty Liver Disease  (ICD-571.8) 5)  Compression Fracture, Spine  (ICD-805.8) 6)  Anosmia  (ICD-781.1) 7)  Hypertension  (ICD-401.9) 8)  Hyperlipidemia  (ICD-272.4) 9)  Arthritis, Rheumatoid  (ICD-714.0) 10)  Hiatal Hernia  (ICD-553.3) 11)  Sjogren's Syndrome  (ICD-710.2) 12)  Anxiety  (ICD-300.00)  Medications Prior to Update: 1)  Lisinopril-Hydrochlorothiazide 20-25 Mg Tabs (Lisinopril-Hydrochlorothiazide) .... Take One Tablet Daily 2)  Nexium 40 Mg Cpdr (Esomeprazole Magnesium) .... Take One Tablet Daily 3)  Multivitamin & Mineral  Liqd (Multiple Vitamins-Minerals) .... Take Daily 4)  Mirtazapine 15 Mg Tabs (Mirtazapine) .... Take One Tablet At Bedtime 5)  Hydroxychloroquine Sulfate 200 Mg Tabs (Hydroxychloroquine Sulfate) .... 2 Tablets Daily 6)  Nasonex 50 Mcg/act Susp (Mometasone Furoate) .... 2 Sprays Each Nostril Once Daily 7)  Vicodin  5-500 Mg Tabs (Hydrocodone-Acetaminophen) .Marland Kitchen.. 1 Tab By Mouth At Bedtime As Needed For Pain. 8)  Valtrex 1 Gm Tabs (Valacyclovir Hcl) .Marland Kitchen.. 1 Tab By Mouth Three Times A Day X7 Days  Current Medications (verified): 1)  Lisinopril-Hydrochlorothiazide 20-25 Mg Tabs (Lisinopril-Hydrochlorothiazide) .... Take One Tablet Daily 2)  Nexium 40 Mg Cpdr (Esomeprazole Magnesium) .... Take One Tablet Daily 3)  Multivitamin & Mineral  Liqd (Multiple Vitamins-Minerals) .... Take Daily 4)  Mirtazapine 15 Mg Tabs (Mirtazapine) .... Take One Tablet At Bedtime 5)  Hydroxychloroquine Sulfate 200 Mg Tabs (Hydroxychloroquine Sulfate) .... 2 Tablets Daily 6)  Nasonex 50 Mcg/act Susp (Mometasone Furoate) .... 2 Sprays Each Nostril Once Daily 7)  Vicodin 5-500 Mg Tabs (Hydrocodone-Acetaminophen) .Marland Kitchen.. 1 Tab By Mouth At Bedtime As Needed For Pain. 8)  Valtrex 1 Gm Tabs (Valacyclovir Hcl) .Marland Kitchen.. 1 Tab By Mouth Three Times A Day X7 Days 9)  Plaquenil 200 Mg Tabs (Hydroxychloroquine Sulfate) .Marland Kitchen.. 1 By Mouth Two Times A Day 10)  Alprazolam 0.25 Mg Tabs (Alprazolam) .Marland Kitchen.. 1 By Mouth Three Times A Day As Needed  Allergies (verified): No Known Drug Allergies  Family History: Reviewed history from 07/17/2009 and no changes required. CAD-mother-stents, father MI, paternal grandmother MI HTN-mother,father DM-mother STROKE-maternal grandmother COLON CA-no BREAST CA-maternal aunt  Social History: Reviewed history from 08/07/2009 and no changes required. works for [employer] Patient never smoked.  Married  Review of Systems      See HPI  Physical Exam  General:  Well-developed,well-nourished,in no acute distress; alert,appropriate and cooperative throughout examination Neck:  No deformities, masses, or tenderness noted. Lungs:  Normal respiratory effort, chest expands symmetrically. Lungs are clear to auscultation, no crackles or wheezes. Heart:  normal rate and Grade  2 /6 systolic ejection murmur.     Extremities:  No clubbing, cyanosis, edema, or deformity noted with normal full range of motion of all joints.   Skin:  + blisters in cluster R side , low back Psych:  Oriented X3, normally interactive, good eye contact, and slightly anxious.     Impression & Recommendations:  Problem # 1:  CHEST PAIN UNSPECIFIED (ICD-786.50) Assessment New prob secondary to anxiety and panic attacks and ? murmur if they occur again --go to ER Orders: Echo Referral (Echo) Venipuncture ([address]) TLB-Cardiac Panel ([id number]) TLB-BMP (Basic Metabolic Panel-BMET) (80048-METABOL) TLB-CBC Platelet - w/Differential (85025-CBCD) TLB-Hepatic/Liver Function Pnl (80076-HEPATIC) TLB-TSH (Thyroid Stimulating Hormone) (84443-TSH) T-D-Dimer Fibrin Derivatives Quantitive ([phone]) T- * Misc. Laboratory test [phone]) EKG w/ Interpretation ([address])  Problem # 2:  CARDIAC MURMUR (ICD-785.2)  Orders: Echo Referral (Echo) Venipuncture ([address]) TLB-Cardiac Panel ([id number]) TLB-BMP (Basic Metabolic Panel-BMET) (80048-METABOL) TLB-CBC Platelet - w/Differential (85025-CBCD) TLB-Hepatic/Liver Function Pnl (80076-HEPATIC) TLB-TSH (Thyroid Stimulating Hormone) (84443-TSH) T-D-Dimer Fibrin Derivatives Quantitive [phone]) T- * Misc. Laboratory test [phone]) EKG w/ Interpretation ([address])  EKG obtained (see interpretation); Will refer to cardiology for evaluation of this murmur.   Problem # 3:  ANXIETY STATE, UNSPECIFIED (ICD-300.00) Assessment: Deteriorated  Pt has long hx anxiety and panic attacks--off cymbalta and xanax for a few years.  Pt would like to wait on cymbalta for now.   Her updated medication list for this problem includes:    Mirtazapine 15 Mg Tabs (Mirtazapine) .Marland Kitchen... Take one tablet at bedtime    Alprazolam 0.25 Mg Tabs (Alprazolam) .Marland Kitchen... 1 by mouth three times a day as needed  Discussed medication use and relaxation techniques.   Orders: EKG w/ Interpretation  ([address])  Problem # 4:  SHINGLES (ICD-053.9) pt would like shingles vaccine---she needs a prior auth  Complete Medication List: 1)  Lisinopril-hydrochlorothiazide 20-25 Mg Tabs (Lisinopril-hydrochlorothiazide) .... Take one tablet daily 2)  Nexium 40 Mg Cpdr (Esomeprazole magnesium) .... Take one tablet daily 3)  Multivitamin & Mineral Liqd (Multiple vitamins-minerals) .... Take daily 4)  Mirtazapine 15 Mg Tabs (Mirtazapine) .... Take one tablet at bedtime 5)  Hydroxychloroquine Sulfate 200 Mg Tabs (Hydroxychloroquine sulfate) .... 2 tablets daily 6)  Nasonex 50 Mcg/act Susp (Mometasone furoate) .... 2 sprays each nostril once daily 7)  Vicodin 5-500 Mg Tabs (Hydrocodone-acetaminophen) .Marland Kitchen.. 1 tab by mouth at bedtime as needed for pain. 8)  Valtrex 1 Gm Tabs (Valacyclovir hcl) .Marland Kitchen.. 1 tab by mouth three times a day x7 days 9)  Plaquenil 200 Mg Tabs (Hydroxychloroquine sulfate) .Marland Kitchen.. 1 by mouth two times a day 10)  Alprazolam 0.25 Mg Tabs (Alprazolam) .Marland Kitchen.. 1 by mouth three times a day as needed Prescriptions: ALPRAZOLAM 0.25 MG TABS (ALPRAZOLAM) 1 by mouth three times a day as needed  #90 x 0   Entered and Authorized by:   Loreen Freud DO   Signed by:   Loreen Freud DO on 01/29/2010   Method used:   Print then Give to Patient   RxID:   [id number]    Orders Added: 1)  Echo Referral [Echo] 2)  Venipuncture [[address]] 3)  TLB-Cardiac Panel [[id number]-CARD] 4)  TLB-BMP (Basic Metabolic Panel-BMET) [80048-METABOL] 5)  TLB-CBC Platelet - w/Differential [85025-CBCD] 6)  TLB-Hepatic/Liver Function Pnl [80076-HEPATIC] 7)  TLB-TSH (Thyroid  Stimulating Hormone) [84443-TSH] 8)  T-D-Dimer Fibrin Derivatives Quantitive [[address]] 9)  T- * Misc. Laboratory test [99999] 10)  Est. Patient Level IV [09811] 11)  EKG w/ Interpretation [93000]

## 2010-02-18 NOTE — Progress Notes (Signed)
Summary: Prior auth APPROVED shingles  VACCINE  ---- Converted from flag ---- ---- 01/29/2010 11:27 AM, Loreen Freud DO wrote: pt needs prior auth for shingles vaccine ------------------------------ left message to call office.Felecia Deloach CMA  January 29, 2010 11:45 AM Phone Note Outgoing Call   Call placed to: Insurer    [phone] contact insurance company per recording close today due to holiday please call back during regular business hours.Felecia Deloach CMA  February 01, 2010 11:12 AM   [phone] PA determination letter along with OV notes and culture results faxed to [phone] awaitng response.Felecia Deloach CMA  February 02, 2010 10:25 AM  Per representative Shanteal vaccine is cover under Pt plan ................Marland KitchenFelecia Deloach CMA  February 11, 2010 10:01 AM   Left message to call office.Felecia Deloach CMA  February 11, 2010 3:18 PM   Pt aware will call to schedule injection 6 to 12 months after infection has resolved.Felecia Deloach CMA  February 12, 2010 8:49 AM

## 2010-02-18 NOTE — Progress Notes (Addendum)
Summary: [name] FOR CT CHEST  Phone Note Other Incoming   Summary of Call: IN REF TO CT CHEST W/CONTRAST PERFORMED ON FRIDAY, 01-29-2010 AFTER 5PM, INSURANCE OFFICE WAS CLOSED.  ALSO CLOSED ON HOLDIAY, 02-01-2010.  I CONTACTED [employer], [phone] FOR AUTHORIZATION OF SVC ALREADY PERFORMED.  THEY TOOK BASIC INFO, THEN REQUIRED I FAX ALL CLINICS TO ATTN CASE # [id number], TO FAX [phone].  I AM NOW AWAITING RESPONSE OF APPROVAL BY FAX. Initial call taken by: Magdalen [facility],  February 02, 2010 10:01 AM     Appended Document: [name] FOR CT CHEST AS OF 02-04-2010, THIS CASE IS STILL IN REVIEW, NO AUTH YET.   Appended Document: [name] FOR CT CHEST THIS CASE # [id number] FOR CT CHEST HAS BEEN APPROVED, AUTH# [id number].  WILL RECIEVE FAX OF AUTHORIZATION.

## 2010-02-23 ENCOUNTER — Other Ambulatory Visit (HOSPITAL_COMMUNITY): Payer: Self-pay

## 2010-02-26 ENCOUNTER — Institutional Professional Consult (permissible substitution): Payer: Self-pay | Admitting: Pulmonary Disease

## 2010-03-03 ENCOUNTER — Telehealth (INDEPENDENT_AMBULATORY_CARE_PROVIDER_SITE_OTHER): Payer: Self-pay | Admitting: *Deleted

## 2010-03-10 NOTE — Progress Notes (Signed)
Summary: refill  Phone Note Refill Request Message from:  Fax from Pharmacy on March 03, 2010 11:20 AM  Refills Requested: Medication #1:  LISINOPRIL-HYDROCHLOROTHIAZIDE 20-25 MG TABS take one tablet daily [facility] [address] [facility] [place] [facility] fax [phone]  Initial call taken by: Okey Regal [place],  March 03, 2010 11:21 AM    Prescriptions: LISINOPRIL-HYDROCHLOROTHIAZIDE 20-25 MG TABS (LISINOPRIL-HYDROCHLOROTHIAZIDE) take one tablet daily  #30 x 5   Entered by:   Doristine Devoid CMA   Authorized by:   Neena Rhymes MD   Signed by:   Doristine Devoid CMA on 03/03/2010   Method used:   [facility]        [facility]  [place] [facility]retail)       [address]       [place], [place]  [address]       Ph: [phone] or [phone]       Fax: [phone]   RxID:   [id number]

## 2010-03-24 ENCOUNTER — Encounter: Payer: Self-pay | Admitting: Family Medicine

## 2010-03-24 ENCOUNTER — Other Ambulatory Visit: Payer: Self-pay | Admitting: Family Medicine

## 2010-03-24 ENCOUNTER — Ambulatory Visit (INDEPENDENT_AMBULATORY_CARE_PROVIDER_SITE_OTHER): Payer: Managed Care, Other (non HMO) | Admitting: Family Medicine

## 2010-03-24 DIAGNOSIS — E785 Hyperlipidemia, unspecified: Secondary | ICD-10-CM

## 2010-03-24 DIAGNOSIS — I10 Essential (primary) hypertension: Secondary | ICD-10-CM

## 2010-03-24 DIAGNOSIS — B029 Zoster without complications: Secondary | ICD-10-CM

## 2010-03-24 DIAGNOSIS — R599 Enlarged lymph nodes, unspecified: Secondary | ICD-10-CM

## 2010-03-24 DIAGNOSIS — Z0000 Encounter for general adult medical examination without abnormal findings: Secondary | ICD-10-CM

## 2010-03-24 DIAGNOSIS — F411 Generalized anxiety disorder: Secondary | ICD-10-CM

## 2010-03-24 LAB — CBC WITH DIFFERENTIAL/PLATELET
HCT: 37.4 % (ref 36.0–46.0)
Lymphocytes Relative: 22.3 % (ref 12.0–46.0)
MCHC: 33.6 g/dL (ref 30.0–36.0)
Monocytes Relative: 11.4 % (ref 3.0–12.0)
Neutro Abs: 3.2 10*3/uL (ref 1.4–7.7)
Neutrophils Relative %: 65.1 % (ref 43.0–77.0)
Platelets: 243 10*3/uL (ref 150.0–400.0)
RDW: 13.1 % (ref 11.5–14.6)
WBC: 4.9 10*3/uL (ref 4.5–10.5)

## 2010-03-24 LAB — HEPATIC FUNCTION PANEL
Albumin: 4 g/dL (ref 3.5–5.2)
Alkaline Phosphatase: 93 U/L (ref 39–117)
Total Bilirubin: 0.4 mg/dL (ref 0.3–1.2)
Total Protein: 9.4 g/dL — ABNORMAL HIGH (ref 6.0–8.3)

## 2010-03-24 LAB — LIPID PANEL
Cholesterol: 214 mg/dL — ABNORMAL HIGH (ref 0–200)
HDL: 46.1 mg/dL (ref >39.00)
VLDL: 18.8 mg/dL (ref 0.0–40.0)

## 2010-03-24 LAB — BASIC METABOLIC PANEL
BUN: 12 mg/dL (ref 6–23)
Chloride: 104 mEq/L (ref 96–112)
GFR: 112.69 mL/min (ref >60.00)
Glucose, Bld: 100 mg/dL — ABNORMAL HIGH (ref 70–99)

## 2010-03-25 LAB — CONVERTED CEMR LAB: Vit D, 25-Hydroxy: 11 ng/mL — ABNORMAL LOW (ref 30–89)

## 2010-03-31 LAB — BASIC METABOLIC PANEL
BUN: 10 mg/dL (ref 6–23)
CO2: 27 mEq/L (ref 19–32)
Calcium: 9.7 mg/dL (ref 8.4–10.5)
Chloride: 104 mEq/L (ref 96–112)
GFR calc Af Amer: >60 mL/min (ref >60)
GFR calc non Af Amer: >60 mL/min (ref >60)

## 2010-04-06 NOTE — Assessment & Plan Note (Signed)
Summary: shingles on right side, also behind ear///sph   Vital Signs:  Patient profile:   55 year old female Height:      64 inches (162.56 cm) Weight:      183.38 pounds (83.35 kg) BMI:     31.59 Temp:     97.0 degrees F (36.11 degrees C) oral BP sitting:   132 / 88  Vitals Entered By: Lucious Groves CMA (March 24, 2010 9:09 AM) CC: C/O shingles on right side and something behind ear./kb Is Patient Diabetic? No Comments Patient notes that she does have pain due to the shingles. The shingles are on her right hip and at the tailbone area. Patient also notes that she needs refill of Mirtazapine.   History of Present Illness: 54 yo woman here today w/  shingles- has area on R buttock, appeared last week.  taking Valtrex.  starting to 'dry up'.  recently had company layoffs- knows stress is what prompted shingles.  bump behind R ear- had some pain, 1st appeared 1 week ago.  applied camphophenique and hydrocortisone.  area is improving.    anxiety- taking alprazolam as needed.  previously in therapy, thinking about going back.  needs refill on Mirtazapine.  Current Medications (verified): 1)  Lisinopril-Hydrochlorothiazide 20-25 Mg Tabs (Lisinopril-Hydrochlorothiazide) .... Take One Tablet Daily 2)  Nexium 40 Mg Cpdr (Esomeprazole Magnesium) .... Take One Tablet Daily 3)  Multivitamin & Mineral  Liqd (Multiple Vitamins-Minerals) .... Take Daily 4)  Mirtazapine 15 Mg Tabs (Mirtazapine) .... Take One Tablet At Bedtime 5)  Hydroxychloroquine Sulfate 200 Mg Tabs (Hydroxychloroquine Sulfate) .... 2 Tablets Daily 6)  Vicodin 5-500 Mg Tabs (Hydrocodone-Acetaminophen) .Marland Kitchen.. 1 Tab By Mouth At Bedtime As Needed For Pain. 7)  Valtrex 1 Gm Tabs (Valacyclovir Hcl) .Marland Kitchen.. 1 Tab By Mouth Three Times A Day X7 Days 8)  Plaquenil 200 Mg Tabs (Hydroxychloroquine Sulfate) .Marland Kitchen.. 1 By Mouth Two Times A Day 9)  Alprazolam 0.25 Mg Tabs (Alprazolam) .Marland Kitchen.. 1 By Mouth Three Times A Day As Needed  Allergies  (verified): No Known Drug Allergies  Past History:  Past medical, surgical, family and social histories (including risk factors) reviewed, and no changes noted (except as noted below).  Past Medical History: Reviewed history from 07/17/2009 and no changes required. Sjogren's syndrome Hiatal Hernia Rhematoid Arthritis Anxiety Hyperlipidemia Hypertension  Past Surgical History: Reviewed history from 07/17/2009 and no changes required. Oophorectomy-1 ovary remain Hysterectomy  Family History: Reviewed history from 07/17/2009 and no changes required. CAD-mother-stents, father MI, paternal grandmother MI HTN-mother,father DM-mother STROKE-maternal grandmother COLON CA-no BREAST CA-maternal aunt  Social History: Reviewed history from 08/07/2009 and no changes required. works for [employer] Patient never smoked.  Married  Review of Systems      See HPI  Physical Exam  General:  Well-developed,well-nourished,in no acute distress; alert,appropriate and cooperative throughout examination Head:  Normocephalic and atraumatic without obvious abnormalities. No apparent alopecia or balding. Neck:  No deformities, masses, or tenderness noted. Lungs:  Normal respiratory effort, chest expands symmetrically. Lungs are clear to auscultation, no crackles or wheezes. Heart:  normal rate and Grade  2 /6 systolic ejection murmur.   Skin:  + blisters in cluster R side of buttock Cervical Nodes:  small, shotty, nontender LAD behind R ear Axillary Nodes:  No palpable lymphadenopathy Inguinal Nodes:  No significant adenopathy Psych:  Oriented X3, normally interactive, good eye contact, and slightly anxious.     Impression & Recommendations:  Problem # 1:  SHINGLES (ICD-053.9) Assessment Unchanged  pt w/ recurrent outbreak on R buttock.  taking Valtrex.  reports area is improving.  Problem # 2:  LYMPHADENOPATHY (ICD-785.6) Assessment: New small, resolving LAD behind R  ear  Problem # 3:  ANXIETY STATE, UNSPECIFIED (ICD-300.00) Assessment: Deteriorated pt aware that she needs to restart counseling due to the amount of stress at work.  will follow. Her updated medication list for this problem includes:    Mirtazapine 15 Mg Tabs (Mirtazapine) .Marland Kitchen... Take one tablet at bedtime    Alprazolam 0.25 Mg Tabs (Alprazolam) .Marland Kitchen... 1 by mouth three times a day as needed  Complete Medication List: 1)  Lisinopril-hydrochlorothiazide 20-25 Mg Tabs (Lisinopril-hydrochlorothiazide) .... Take one tablet daily 2)  Nexium 40 Mg Cpdr (Esomeprazole magnesium) .... Take one tablet daily 3)  Multivitamin & Mineral Liqd (Multiple vitamins-minerals) .... Take daily 4)  Mirtazapine 15 Mg Tabs (Mirtazapine) .... Take one tablet at bedtime 5)  Hydroxychloroquine Sulfate 200 Mg Tabs (Hydroxychloroquine sulfate) .... 2 tablets daily 6)  Vicodin 5-500 Mg Tabs (Hydrocodone-acetaminophen) .Marland Kitchen.. 1 tab by mouth at bedtime as needed for pain. 7)  Valtrex 1 Gm Tabs (Valacyclovir hcl) .Marland Kitchen.. 1 tab by mouth three times a day x7 days 8)  Plaquenil 200 Mg Tabs (Hydroxychloroquine sulfate) .Marland Kitchen.. 1 by mouth two times a day 9)  Alprazolam 0.25 Mg Tabs (Alprazolam) .Marland Kitchen.. 1 by mouth three times a day as needed 10)  Vitamin D (ergocalciferol) 50000 Unit Caps (Ergocalciferol) .... Take 1 cap weekly x12 weeks 11)  Simvastatin 20 Mg Tabs (Simvastatin) .Marland Kitchen.. 1 by mouth at bedtime  Other Orders: Venipuncture ([address]) T-Vitamin D (25-Hydroxy) (98119-14782) Specimen Handling (95621) TLB-Lipid Panel (80061-LIPID) TLB-Hepatic/Liver Function Pnl (80076-HEPATIC) TLB-CBC Platelet - w/Differential (85025-CBCD) TLB-BMP (Basic Metabolic Panel-BMET) (80048-METABOL) TLB-TSH (Thyroid Stimulating Hormone) (84443-TSH)  Patient Instructions: 1)  Schedule your complete physical in the next few weeks 2)  We'll notify you of your lab results 3)  Consider counseling as an outlet for your emotional stressors 4)  The bump behind  your ear was a swollen lymph node- this is nothing to worry about 5)  Call with any questions or concerns 6)  Hang in there!!!! Prescriptions: MIRTAZAPINE 15 MG TABS (MIRTAZAPINE) take one tablet at bedtime  #30 x 3   Entered and Authorized by:   Neena Rhymes MD   Signed by:   Neena Rhymes MD on 03/24/2010   Method used:   [facility]        [facility] [phone]* (retail)       [address]       [place], [place]  [address]       Ph: [phone]       Fax: [phone]   RxID:   [id number]    Orders Added: 1)  Venipuncture [87564] 2)  T-Vitamin D (25-Hydroxy) [phone] 3)  Specimen Handling [99000] 4)  TLB-Lipid Panel [80061-LIPID] 5)  TLB-Hepatic/Liver Function Pnl [80076-HEPATIC] 6)  TLB-CBC Platelet - w/Differential [85025-CBCD] 7)  TLB-BMP (Basic Metabolic Panel-BMET) [80048-METABOL] 8)  TLB-TSH (Thyroid Stimulating Hormone) [84443-TSH] 9)  Est. Patient Level IV [66063]

## 2010-04-08 ENCOUNTER — Encounter: Payer: Self-pay | Admitting: Family Medicine

## 2010-04-15 NOTE — Miscellaneous (Signed)
Summary: Orders Update  Clinical Lists Changes  Orders: Added new Referral order of Rheumatology Referral (Rheumatology) - Signed 

## 2010-04-29 ENCOUNTER — Other Ambulatory Visit: Payer: Self-pay | Admitting: Family Medicine

## 2010-04-29 DIAGNOSIS — Z1231 Encounter for screening mammogram for malignant neoplasm of breast: Secondary | ICD-10-CM

## 2010-05-05 ENCOUNTER — Ambulatory Visit
Admission: RE | Admit: 2010-05-05 | Discharge: 2010-05-05 | Disposition: A | Discharge status: Home / Self Care | Payer: Managed Care, Other (non HMO) | Source: Ambulatory Visit | Attending: Family Medicine | Admitting: Family Medicine

## 2010-05-05 DIAGNOSIS — Z1231 Encounter for screening mammogram for malignant neoplasm of breast: Secondary | ICD-10-CM

## 2010-05-08 ENCOUNTER — Emergency Department (HOSPITAL_COMMUNITY): Payer: Managed Care, Other (non HMO)

## 2010-05-08 ENCOUNTER — Emergency Department (HOSPITAL_COMMUNITY)
Admission: EM | Admit: 2010-05-08 | Discharge: 2010-05-09 | Disposition: A | Discharge status: Home / Self Care | Payer: Managed Care, Other (non HMO) | Attending: Emergency Medicine | Admitting: Emergency Medicine

## 2010-05-08 DIAGNOSIS — K219 Gastro-esophageal reflux disease without esophagitis: Secondary | ICD-10-CM | POA: Insufficient documentation

## 2010-05-08 DIAGNOSIS — R0609 Other forms of dyspnea: Secondary | ICD-10-CM | POA: Insufficient documentation

## 2010-05-08 DIAGNOSIS — K449 Diaphragmatic hernia without obstruction or gangrene: Secondary | ICD-10-CM | POA: Insufficient documentation

## 2010-05-08 DIAGNOSIS — R1012 Left upper quadrant pain: Secondary | ICD-10-CM | POA: Insufficient documentation

## 2010-05-08 DIAGNOSIS — R0989 Other specified symptoms and signs involving the circulatory and respiratory systems: Secondary | ICD-10-CM | POA: Insufficient documentation

## 2010-05-08 DIAGNOSIS — R197 Diarrhea, unspecified: Secondary | ICD-10-CM | POA: Insufficient documentation

## 2010-05-08 DIAGNOSIS — M3500 Sicca syndrome, unspecified: Secondary | ICD-10-CM | POA: Insufficient documentation

## 2010-05-08 DIAGNOSIS — I10 Essential (primary) hypertension: Secondary | ICD-10-CM | POA: Insufficient documentation

## 2010-05-08 DIAGNOSIS — Z79899 Other long term (current) drug therapy: Secondary | ICD-10-CM | POA: Insufficient documentation

## 2010-05-08 LAB — DIFFERENTIAL
Basophils Absolute: 0 10*3/uL (ref 0.0–0.1)
Basophils Relative: 0 % (ref 0–1)
Monocytes Relative: 14 % — ABNORMAL HIGH (ref 3–12)
Neutrophils Relative %: 61 % (ref 43–77)

## 2010-05-08 LAB — COMPREHENSIVE METABOLIC PANEL
AST: 46 U/L — ABNORMAL HIGH (ref 0–37)
Alkaline Phosphatase: 79 U/L (ref 39–117)
Calcium: 9.7 mg/dL (ref 8.4–10.5)
GFR calc non Af Amer: >60 mL/min (ref >60)
Glucose, Bld: 100 mg/dL — ABNORMAL HIGH (ref 70–99)
Potassium: 3.4 mEq/L — ABNORMAL LOW (ref 3.5–5.1)

## 2010-05-08 LAB — URINALYSIS, ROUTINE W REFLEX MICROSCOPIC
Bilirubin Urine: NEGATIVE
Bilirubin Urine: NEGATIVE
Hgb urine dipstick: NEGATIVE
Ketones, ur: NEGATIVE mg/dL
Ketones, ur: NEGATIVE mg/dL
Nitrite: NEGATIVE
Nitrite: NEGATIVE
Protein, ur: NEGATIVE mg/dL
Specific Gravity, Urine: 1.01 (ref 1.005–1.030)

## 2010-05-08 LAB — CBC
HCT: 35.8 % — ABNORMAL LOW (ref 36.0–46.0)
Hemoglobin: 12 g/dL (ref 12.0–15.0)
MCH: 28.4 pg (ref 26.0–34.0)
MCV: 84.6 fL (ref 78.0–100.0)
Platelets: 281 10*3/uL (ref 150–400)
RBC: 4.23 MIL/uL (ref 3.87–5.11)
RDW: 12.9 % (ref 11.5–15.5)
WBC: 5.6 10*3/uL (ref 4.0–10.5)

## 2010-05-08 LAB — URINE MICROSCOPIC-ADD ON

## 2010-05-08 MED ORDER — IOHEXOL 300 MG/ML  SOLN
100.0000 mL | Freq: Once | INTRAMUSCULAR | Status: AC | PRN
  Administered 2010-05-08: 100 mL via INTRAVENOUS

## 2010-05-10 LAB — URINE CULTURE
Colony Count: NO GROWTH
Culture  Setup Time: 201204221127

## 2010-06-01 ENCOUNTER — Encounter: Payer: Self-pay | Admitting: Pulmonary Disease

## 2010-06-01 ENCOUNTER — Encounter: Payer: Self-pay | Admitting: *Deleted

## 2010-06-01 NOTE — H&P (Signed)
[name], [name]              ACCOUNT NO.:  [id number]   MEDICAL RECORD NO.:  [id number]          PATIENT TYPE:  INP   LOCATION:  3733                         FACILITY:  [facility]   PHYSICIAN:  Ladell Pier, M.D.   DATE OF BIRTH:  1955/02/08   DATE OF ADMISSION:  12/04/2007  DATE OF DISCHARGE:                              HISTORY & PHYSICAL   CHIEF COMPLAINT:  Chest pain.   HISTORY OF PRESENT ILLNESS:  The patient is a 55 year old African  American female with past medical history significant for rheumatoid  arthritis, GERD and hypertension.  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  She was at work today where she started to  develop chest pain.  She stated that the pain was on the left side of  her chest and it felt more like a pulsating pain and sharp.  It was  associated with some shortness of breath.  The pain lasted until EMS got  there and gave her sublingual nitroglycerin that kind of eased it off.  The pain came back and she was given morphine in the ER, and that also  relieved the pain.  She had no nausea or vomiting with the pain.  She  did have some diaphoresis.  She stated that she has been more stressed  at work lately.  She works at [employer] [place] and they are doing a lot  of transition there.  She recently also traveled to [place] 4 weeks ago.  She stated that she was told that her atrium was slightly enlarged.  She  had an echo about a year ago.   PAST MEDICAL HISTORY:  1. GERD.  2. Hypertension.  3. Sjogren's syndrome.  4. Rheumatoid arthritis for which she sees Dr. Cory Roughen at [facility].  5. Hypertension.  6. Total abdominal hysterectomy and salpingo-oophorectomy.  7. History of lysis of adhesions.   FAMILY HISTORY:  Mother has heart disease, she is 57.  Father had a  heart attack at 42.   SOCIAL HISTORY:  No tobacco use.  Occasional alcohol.  She is married.  She has one child.  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   MEDICATIONS:  1. Lisinopril/hydrochlorothiazide 20/25 daily.  2. Plaquenil 200 mg 2 daily.  3. Xanax 0.25 mg as needed.  4. Nexium 20 mg daily.   ALLERGIES:  NO KNOWN DRUG ALLERGIES.   REVIEW OF SYSTEMS:  Negative, otherwise stated in the HPI.   PHYSICAL EXAMINATION:  VITAL SIGNS:  Temperature 98.2, pulse 73,  respirations 20, blood pressure 135/83.  Pulse ox 97% on room air.  GENERAL:  The patient lying down in bed, does not seem to be in any  acute distress.  HEENT:  Head is normocephalic, atraumatic.  Pupils reactive to light.  Throat without erythema.  CARDIOVASCULAR:  Regular rate and rhythm.  LUNGS:  Clear to auscultation bilaterally.  ABDOMEN:  Soft, nontender, nondistended.  Positive bowel sounds.  EXTREMITIES:  Without edema.   LABORATORY DATA:  Myoglobin 67.3, MB less than 1, troponin 0.05.  Sodium  139, potassium 3.9, chloride 102, CO2 of 26, glucose 98, BUN 12,  creatinine 0.7, calcium 9.7.  WBC 4.9, hemoglobin 11.5, MCV of 83.9,  platelets 233.  Chest x-ray within normal limits.   ASSESSMENT/PLAN:  1. Chest pain, rule out myocardial infarction.  2. Gastroesophageal reflux disease.  3. Hypertension.  4. Rheumatoid arthritis.  5. Sjogren's syndrome.   Will admit the patient to the hospital, check fasting lipid panel, D-  dimer serial enzymes, EKG.  Continue her on her home meds.  Will either  schedule her for inpatient versus outpatient stress test.      Ladell Pier, M.D.  Electronically Signed     NJ/MEDQ  D:  12/04/2007  T:  12/04/2007  Job:  [id number]

## 2010-06-04 NOTE — Discharge Summary (Signed)
[name], [name]              ACCOUNT NO.:  [id number]   MEDICAL RECORD NO.:  [id number]          PATIENT TYPE:  INP   LOCATION:  3733                         FACILITY:  [facility]   PHYSICIAN:  Michelene Gardener, MD    DATE OF BIRTH:  [birth year]   DATE OF ADMISSION:  12/04/2007  DATE OF DISCHARGE:  12/05/2007                               DISCHARGE SUMMARY   DISCHARGE DIAGNOSES:  1. Chest pain, which is most likely secondary to acid reflux.  2. Gastroesophageal reflux disease.  3. Hypertension.  4. Rheumatoid arthritis.  5. Sjogren syndrome.   DISCHARGE MEDICATIONS:  1. Lisinopril/hydrochlorothiazide 20/25 mg once a day.  2. Plaque 200 mg twice daily.  3. Xanax 0.25 mg as needed.  4. Nexium 40 mg once a day.   CONSULTATIONS:  None.   PROCEDURE:  None.   RADIOLOGY STUDIES:  Chest x-ray on December 04, 2007, showed no acute  findings.   COURSE OF HOSPITALIZATION:  This is a 55 year old female who presented  to the hospital with atypical chest pain.  Chest pain is most likely  secondary to GERD.  The patient was admitted to the hospital for further  evaluation.  Three sets of troponin and cardiac enzymes were done and  they came to be negative.  EKG was done and showed no evidence of acute  ischemia.  Tele monitor showed no evidence of ischemia.  On the day of  discharge, I discussed that the possibility of having a stress test,  inpatient versus outpatient.  The patient prefers to have this followed  by her primary doctor and to be scheduled as an outpatient.  The patient  has been totally asymptomatic during her hospitalization with negative  workup.  It is felt that she is okay to go home and to be evaluated as  an outpatient for stress test.  I advised her to be compliant with her  Nexium and either to contact her doctor or come to the ER if she develop  more chest pain or shortness of breath.   TOTAL ASSESSMENT TIME:  40 minutes.      Michelene Gardener, MD  Electronically Signed     NAE/MEDQ  D:  12/07/2007  T:  12/08/2007  Job:  [id number]

## 2010-06-07 ENCOUNTER — Ambulatory Visit (INDEPENDENT_AMBULATORY_CARE_PROVIDER_SITE_OTHER): Payer: Managed Care, Other (non HMO) | Admitting: Pulmonary Disease

## 2010-06-07 ENCOUNTER — Encounter: Payer: Self-pay | Admitting: Pulmonary Disease

## 2010-06-07 DIAGNOSIS — R599 Enlarged lymph nodes, unspecified: Secondary | ICD-10-CM

## 2010-06-07 DIAGNOSIS — R0602 Shortness of breath: Secondary | ICD-10-CM

## 2010-06-07 DIAGNOSIS — M3500 Sicca syndrome, unspecified: Secondary | ICD-10-CM

## 2010-06-07 NOTE — Assessment & Plan Note (Addendum)
Doubt this is due to pulmonary cause - INfiltrates seem to be post inflammatory - likely related to Sjogren's, doubt malignnacy in this never smoker. Obtain PFTs to quantitate & get baseline. Rpt Ct chest in 6 mnths If cough persists, consider stopping lisinopril

## 2010-06-07 NOTE — Progress Notes (Signed)
  Subjective:    Patient ID: [name], female    DOB: 30-Jun-1955, 55 y.o.   MRN: [id number]  HPI PCP : [facility] : gessner  Initial OV 08/07/09  54/F never smoker with Sjogren's & RA for FU of dyspnea on exertion. She reports a band like pain in her chest & middle back x 3 mnths, with dyspne aon exertion & cough productive of yellow thick mucus especially in am. She can walk 1/2 mile in the park. Her rheumatologist is Dr Carley Hammed at [facility]. CXR 08/04/09 was wnl. Ctc hest 07/17/09 showed mild central bronchiectasis with interstitio-alveolar infiltrate in medial right lung base. Small hiatal hernia & fatty liver were noted. Lisinopril is noted on med review, stable on this x 5 yrs. Does admit to symptoms of anxiety >>Unable to obtain PFTs from Dr Carley Hammed & review. Not impressed by the degree of pulmonary infiltrates Bronchiectasis appears to be mild  No evidence of pulmonary hypertension.   06/07/2010 ER visit in 4/12 for band like chest pain Cough with mucus - oTC meds, honey, lemon CT angio 1/12 & again in 4/12 showed stable hiatal hernia & ground glass densities in RUL & LLL - likely post inflammatory. Has been started on low dose benzos for anxiety by Psych     Review of Systems Pt denies any significant  nasal congestion or excess secretions, fever, chills, sweats, unintended wt loss, pleuritic or exertional cp, orthopnea pnd or leg swelling.  Pt also denies any obvious fluctuation in symptoms with weather or environmental change or other alleviating or aggravating factors.    Pt denies any increase in rescue therapy over baseline, denies waking up needing it or having early am exacerbations or coughing/wheezing/ or dyspnea      Objective:   Physical Exam Gen. Pleasant, well-nourished, in no distress ENT - no lesions, no post nasal drip Neck: No JVD, no thyromegaly, no carotid bruits Lungs: no use of accessory muscles, no dullness to percussion, clear without rales or rhonchi    Cardiovascular: Rhythm regular, heart sounds  normal, no murmurs or gallops, no peripheral edema Musculoskeletal: No deformities, no cyanosis or clubbing          Assessment & Plan:

## 2010-06-07 NOTE — Patient Instructions (Signed)
Schedule breathing test - we will cal you with results Repeat CT scan in October If cough persists, consider stopping LISINOPRIL

## 2010-06-16 ENCOUNTER — Ambulatory Visit (INDEPENDENT_AMBULATORY_CARE_PROVIDER_SITE_OTHER): Payer: Managed Care, Other (non HMO) | Admitting: Family Medicine

## 2010-06-16 DIAGNOSIS — I10 Essential (primary) hypertension: Secondary | ICD-10-CM

## 2010-06-16 DIAGNOSIS — F411 Generalized anxiety disorder: Secondary | ICD-10-CM

## 2010-06-16 NOTE — Patient Instructions (Signed)
Follow up in 4-6 weeks to recheck blood pressure Please call your therapist and discuss starting a controller medication Use the xanax as needed- that's what it's there for! Call with any questions or concerns Hang in there!!!

## 2010-06-16 NOTE — Progress Notes (Signed)
  Subjective:    Patient ID: [name], female    DOB: 12-27-1955, 55 y.o.   MRN: [id number]  HPI HTN-  Reports BP was elevated yesterday at ENT office (161/107).  Has been having increased anxiety.  Denies CP, increased SOB, HAs, visual changes, edema.  Anxiety- panic attack attack over the weekend, again at work today.  Has upcoming sinus surgery, multiple medical issues.  Therapist recently took pt out of work for 2 weeks, she returned and again feels overwhelmed.  Finds herself tearful.  Xanax will help in times of panic.  Has been discussing starting controller medication.   Review of Systems For ROS see HPI     Objective:   Physical Exam  Constitutional: She is oriented to person, place, and time. She appears well-developed and well-nourished. No distress.  HENT:  Head: Normocephalic and atraumatic.  Eyes: Conjunctivae and EOM are normal. Pupils are equal, round, and reactive to light.  Neck: Normal range of motion. Neck supple. No thyromegaly present.  Cardiovascular: Normal rate, regular rhythm, normal heart sounds and intact distal pulses.   No murmur heard. Pulmonary/Chest: Effort normal and breath sounds normal. No respiratory distress.  Abdominal: Soft. She exhibits no distension. There is no tenderness.  Musculoskeletal: She exhibits no edema.  Lymphadenopathy:    She has no cervical adenopathy.  Neurological: She is alert and oriented to person, place, and time.  Skin: Skin is warm and dry.  Psychiatric: She has a normal mood and affect. Her behavior is normal.          Assessment & Plan:

## 2010-06-28 ENCOUNTER — Other Ambulatory Visit (INDEPENDENT_AMBULATORY_CARE_PROVIDER_SITE_OTHER): Payer: Self-pay | Admitting: Otolaryngology

## 2010-06-28 DIAGNOSIS — R430 Anosmia: Secondary | ICD-10-CM

## 2010-06-28 NOTE — Assessment & Plan Note (Signed)
Pt's anxiety is poorly controlled.  Suggested she speak w/ her therapist to start controller medication.  Encouraged her to use xanax prn.  Will follow.

## 2010-06-28 NOTE — Assessment & Plan Note (Signed)
BP well controlled today.  Likely elevated due to recent anxiety and stressors.  Fear that if BP meds are changed she may find herself hypotensive.  Will continue current meds and follow closely.

## 2010-07-05 ENCOUNTER — Ambulatory Visit
Admission: RE | Admit: 2010-07-05 | Discharge: 2010-07-05 | Disposition: A | Discharge status: Home / Self Care | Payer: Managed Care, Other (non HMO) | Source: Ambulatory Visit | Attending: Otolaryngology | Admitting: Otolaryngology

## 2010-07-05 DIAGNOSIS — R430 Anosmia: Secondary | ICD-10-CM

## 2010-07-05 MED ORDER — GADOBENATE DIMEGLUMINE 529 MG/ML IV SOLN
17.0000 mL | Freq: Once | INTRAVENOUS | Status: AC | PRN
  Administered 2010-07-05: 17 mL via INTRAVENOUS

## 2010-10-08 ENCOUNTER — Encounter: Payer: Self-pay | Admitting: Family Medicine

## 2010-10-08 ENCOUNTER — Ambulatory Visit (INDEPENDENT_AMBULATORY_CARE_PROVIDER_SITE_OTHER): Payer: Managed Care, Other (non HMO) | Admitting: Family Medicine

## 2010-10-08 DIAGNOSIS — F411 Generalized anxiety disorder: Secondary | ICD-10-CM

## 2010-10-08 DIAGNOSIS — B029 Zoster without complications: Secondary | ICD-10-CM

## 2010-10-08 MED ORDER — VALACYCLOVIR HCL 1 G PO TABS: 1000.0000 mg | ORAL_TABLET | Freq: Three times a day (TID) | ORAL | Status: DC | PRN

## 2010-10-08 MED ORDER — DULOXETINE HCL 30 MG PO CPEP: 30.0000 mg | ORAL_CAPSULE | Freq: Every day | ORAL | Status: DC

## 2010-10-08 NOTE — Patient Instructions (Signed)
Follow up in 1 month to recheck mood Bring the FMLA forms for me to complete START the Cymbalta daily Take the Valtrex as directed for the shingles Call with questions or concerns Hang in there!

## 2010-10-08 NOTE — Progress Notes (Signed)
  Subjective:    Patient ID: [name], female    DOB: [birth year], 55 y.o.   MRN: [id number]  HPI Shingles- started yesterday, R upper buttock.  + pain.  'very stressed'.  Anxiety- boss recommended pt file for FMLA b/c pt is so stressed.  Taking xanax prn.  Previously on Cymbalta which she reports worked well but 'that was a long time ago'.  Has been in counseling- seeing Dr Sandria Manly.  Last seen 1 month ago.  Pt aware that this is her biggest issue.  Review of Systems For ROS see HPI     Objective:   Physical Exam  Constitutional: She appears well-developed and well-nourished.  Skin: Skin is warm and dry. Rash (vesicular rash consistent w/ shingles superior and left of gluteal cleft) noted.  Psychiatric:       Very anxious          Assessment & Plan:

## 2010-10-10 NOTE — Assessment & Plan Note (Signed)
Deteriorated.  Start controller med daily and continue xanax prn.  Encouraged her to resume therapy.  Will follow closely.

## 2010-10-10 NOTE — Assessment & Plan Note (Signed)
Restart Valtrex

## 2010-10-12 DIAGNOSIS — Z0279 Encounter for issue of other medical certificate: Secondary | ICD-10-CM

## 2010-10-14 ENCOUNTER — Telehealth: Payer: Self-pay

## 2010-10-14 NOTE — Telephone Encounter (Signed)
Returned call to pt. Left message for pt to call back with more information about the message that was left for her. No phone note in chart

## 2010-10-19 ENCOUNTER — Telehealth: Payer: Self-pay

## 2010-10-19 NOTE — Telephone Encounter (Signed)
Left another message for pt to call back.

## 2010-10-20 LAB — DIFFERENTIAL
Basophils Relative: 2 — ABNORMAL HIGH
Eosinophils Absolute: 0.1
Neutro Abs: 3.1
Neutrophils Relative %: 64

## 2010-10-20 LAB — COMPREHENSIVE METABOLIC PANEL
ALT: 37 — ABNORMAL HIGH
BUN: 10
CO2: 24
Calcium: 9.4
Creatinine, Ser: 0.79
GFR calc non Af Amer: >60
Glucose, Bld: 101 — ABNORMAL HIGH

## 2010-10-20 LAB — CK TOTAL AND CKMB (NOT AT ARMC)
CK, MB: 0.5
Relative Index: INVALID

## 2010-10-20 LAB — BASIC METABOLIC PANEL
BUN: 12
CO2: 26
Calcium: 9.7
Chloride: 102
Creatinine, Ser: 0.7

## 2010-10-20 LAB — TROPONIN I
Troponin I: 0.01
Troponin I: <0.01

## 2010-10-20 LAB — LIPID PANEL
Cholesterol: 173
HDL: 29 — ABNORMAL LOW
LDL Cholesterol: 121 — ABNORMAL HIGH
Triglycerides: 115

## 2010-10-20 LAB — CBC
MCHC: 33.8
MCV: 83.9
Platelets: 233
WBC: 4.9

## 2010-10-20 LAB — POCT CARDIAC MARKERS: Myoglobin, poc: 67.3

## 2010-10-20 NOTE — Telephone Encounter (Signed)
Several attempts to reach pt to no avail

## 2010-10-27 ENCOUNTER — Ambulatory Visit (INDEPENDENT_AMBULATORY_CARE_PROVIDER_SITE_OTHER)
Admission: RE | Admit: 2010-10-27 | Discharge: 2010-10-27 | Disposition: A | Discharge status: Home / Self Care | Payer: Managed Care, Other (non HMO) | Source: Ambulatory Visit | Attending: Pulmonary Disease | Admitting: Pulmonary Disease

## 2010-10-27 DIAGNOSIS — M3500 Sicca syndrome, unspecified: Secondary | ICD-10-CM

## 2010-10-27 DIAGNOSIS — R599 Enlarged lymph nodes, unspecified: Secondary | ICD-10-CM

## 2010-10-27 DIAGNOSIS — R0602 Shortness of breath: Secondary | ICD-10-CM

## 2010-10-28 NOTE — Progress Notes (Signed)
Quick Note:  I informed pt of RA's findings and recommendations. Pt verbalized understanding  ______ 

## 2010-12-27 ENCOUNTER — Ambulatory Visit (INDEPENDENT_AMBULATORY_CARE_PROVIDER_SITE_OTHER): Payer: Managed Care, Other (non HMO) | Admitting: Family Medicine

## 2010-12-27 ENCOUNTER — Encounter: Payer: Self-pay | Admitting: Family Medicine

## 2010-12-27 VITALS — BP 130/75 | HR 105 | Temp 100.8°F | Ht 64.5 in | Wt 188.2 lb

## 2010-12-27 DIAGNOSIS — R6889 Other general symptoms and signs: Secondary | ICD-10-CM

## 2010-12-27 DIAGNOSIS — J111 Influenza due to unidentified influenza virus with other respiratory manifestations: Secondary | ICD-10-CM | POA: Insufficient documentation

## 2010-12-27 MED ORDER — OSELTAMIVIR PHOSPHATE 75 MG PO CAPS: 75.0000 mg | ORAL_CAPSULE | Freq: Two times a day (BID) | ORAL | Status: AC

## 2010-12-27 NOTE — Assessment & Plan Note (Signed)
Flu test confirms pt's sxs are indeed the flu.  Start tamiflu.  Reviewed supportive care and red flags that should prompt return.  Pt expressed understanding and is in agreement w/ plan.

## 2010-12-27 NOTE — Patient Instructions (Signed)
You have the Flu Start the Tamiflu tonight REST! Drink plenty of fluids Alternate tylenol and ibuprofen every 4 hrs for pain and fever Call with any questions or concerns Hang in there! Happy Holidays!

## 2010-12-27 NOTE — Progress Notes (Signed)
  Subjective:    Patient ID: [name], female    DOB: [birth year], 55 y.o.   MRN: [id number]  HPI Flu- sxs started Friday w/ cough, body aches, chills.  Started vomiting on Saturday w/ fever.  Tm 102.  Cough this AM was productive of blood tinged sputum.   Review of Systems For ROS see HPI     Objective:   Physical Exam  Constitutional: She appears well-developed and well-nourished. No distress.  HENT:  Head: Normocephalic and atraumatic.       TMs normal bilaterally Mild nasal congestion Throat w/out erythema, edema, or exudate  Eyes: Conjunctivae and EOM are normal. Pupils are equal, round, and reactive to light.  Neck: Normal range of motion. Neck supple.  Cardiovascular: Normal rate, regular rhythm, normal heart sounds and intact distal pulses.   No murmur heard. Pulmonary/Chest: Effort normal and breath sounds normal. No respiratory distress. She has no wheezes.       + dry cough  Lymphadenopathy:    She has no cervical adenopathy.          Assessment & Plan:

## 2011-02-21 ENCOUNTER — Ambulatory Visit (INDEPENDENT_AMBULATORY_CARE_PROVIDER_SITE_OTHER): Payer: Managed Care, Other (non HMO) | Admitting: Family Medicine

## 2011-02-21 ENCOUNTER — Encounter: Payer: Self-pay | Admitting: Family Medicine

## 2011-02-21 VITALS — BP 122/85 | HR 99 | Temp 99.1°F | Ht 64.5 in | Wt 183.8 lb

## 2011-02-21 DIAGNOSIS — M3500 Sicca syndrome, unspecified: Secondary | ICD-10-CM

## 2011-02-21 DIAGNOSIS — R439 Unspecified disturbances of smell and taste: Secondary | ICD-10-CM

## 2011-02-21 DIAGNOSIS — J329 Chronic sinusitis, unspecified: Secondary | ICD-10-CM

## 2011-02-21 MED ORDER — CLARITHROMYCIN ER 500 MG PO TB24: 1000.0000 mg | ORAL_TABLET | Freq: Every day | ORAL | Status: AC

## 2011-02-21 MED ORDER — BENZONATATE 200 MG PO CAPS: 200.0000 mg | ORAL_CAPSULE | Freq: Three times a day (TID) | ORAL | Status: AC | PRN

## 2011-02-21 NOTE — Assessment & Plan Note (Signed)
Refer to ENT at pt's request 

## 2011-02-21 NOTE — Assessment & Plan Note (Signed)
New.  Start abx.  Reviewed supportive care and red flags that should prompt return.  Pt expressed understanding and is in agreement w/ plan.  

## 2011-02-21 NOTE — Assessment & Plan Note (Signed)
Refer to ENT at pt's request

## 2011-02-21 NOTE — Progress Notes (Signed)
  Subjective:    Patient ID: [name], female    DOB: [birth year], 56 y.o.   MRN: [id number]  HPI ? Sinusitis- having 'greenish brown stuff' from nose, today was blood tinged.  + HA, cough- productive.  + facial pain.  + low grade temps.  sxs started 5 days ago.  + sick contacts.  No ear pain.  Denies abd pain, N/V/D.  + LAD   Review of Systems For ROS see HPI     Objective:   Physical Exam  Constitutional: She appears well-developed and well-nourished. No distress.  HENT:  Head: Normocephalic and atraumatic.  Right Ear: Tympanic membrane normal.  Left Ear: Tympanic membrane normal.  Nose: Mucosal edema and rhinorrhea present. Right sinus exhibits maxillary sinus tenderness and frontal sinus tenderness. Left sinus exhibits maxillary sinus tenderness and frontal sinus tenderness.  Mouth/Throat: Uvula is midline and mucous membranes are normal. Posterior oropharyngeal erythema present. No oropharyngeal exudate.  Eyes: Conjunctivae and EOM are normal. Pupils are equal, round, and reactive to light.  Neck: Normal range of motion. Neck supple.  Cardiovascular: Normal rate, regular rhythm and normal heart sounds.   Pulmonary/Chest: Effort normal and breath sounds normal. No respiratory distress. She has no wheezes.  Lymphadenopathy:    She has cervical adenopathy.          Assessment & Plan:

## 2011-02-21 NOTE — Patient Instructions (Signed)
This is a sinus infection Start the Biaxin- 2 tabs at the same time- w/ food Drink plenty of fluids Add Mucinex to thin your congestin Tessalon as needed for cough We'll call you with your ENT appt Sherri Rad in there!!!

## 2011-03-30 ENCOUNTER — Encounter: Payer: Self-pay | Admitting: Family Medicine

## 2011-03-30 ENCOUNTER — Ambulatory Visit (INDEPENDENT_AMBULATORY_CARE_PROVIDER_SITE_OTHER): Payer: Managed Care, Other (non HMO) | Admitting: Family Medicine

## 2011-03-30 ENCOUNTER — Other Ambulatory Visit: Payer: Self-pay | Admitting: Family Medicine

## 2011-03-30 VITALS — BP 118/80 | HR 81 | Temp 97.5°F | Ht 64.25 in | Wt 186.4 lb

## 2011-03-30 DIAGNOSIS — B029 Zoster without complications: Secondary | ICD-10-CM

## 2011-03-30 DIAGNOSIS — M940 Chondrocostal junction syndrome [Tietze]: Secondary | ICD-10-CM | POA: Insufficient documentation

## 2011-03-30 DIAGNOSIS — Z1231 Encounter for screening mammogram for malignant neoplasm of breast: Secondary | ICD-10-CM

## 2011-03-30 MED ORDER — VALACYCLOVIR HCL 1 G PO TABS: 1000.0000 mg | ORAL_TABLET | Freq: Three times a day (TID) | ORAL | Status: DC | PRN

## 2011-03-30 MED ORDER — NAPROXEN 500 MG PO TABS: 500.0000 mg | ORAL_TABLET | Freq: Two times a day (BID) | ORAL | Status: DC

## 2011-03-30 NOTE — Assessment & Plan Note (Signed)
Recurrent.  Restart Valtrex.

## 2011-03-30 NOTE — Assessment & Plan Note (Signed)
New.  Pt's rib tenderness is likely musculoskeletal but she feels it is similar to when she had bronchiectasis.  Lungs CTA.  Pt wants to see Dr Vassie Loll- has appt in May.  Would like to move this up- encouraged her to call and schedule.  Start scheduled NSAIDs.  Reviewed supportive care and red flags that should prompt return.  Pt expressed understanding and is in agreement w/ plan.

## 2011-03-30 NOTE — Patient Instructions (Signed)
This is shingles Start the Valtrex 3x/day Start the Naproxen twice daily- w/ food for the costochondritis Heating pad! Call Dr Vassie Loll and have him see you sooner than May Call with any questions or concerns Hang in there!!!

## 2011-03-30 NOTE — Progress Notes (Signed)
  Subjective:    Patient ID: [name], female    DOB: 1955/09/05, 56 y.o.   MRN: [id number]  HPI Shingles- first noticed on Monday night, pain preceded rash.  Rash appeared Tuesday morning.  On R buttock.  No fevers.  Had some associated nausea- this has resolved.  Hx of similar.  Reports stress at work.  L side pain- sxs started in Jan.  Had similar pain when dx'd w/ bronchiectasis.  Painful to lie on, pain w/ breathing.  Improves w/ ibuprofen.  Persistent cough- productive.  Has appt upcoming w/ Dr Vassie Loll.   Review of Systems For ROS see HPI     Objective:   Physical Exam  Vitals reviewed. Constitutional: She appears well-developed and well-nourished. No distress.  Neck: Normal range of motion. Neck supple.  Cardiovascular: Normal rate, regular rhythm, normal heart sounds and intact distal pulses.   No murmur heard. Pulmonary/Chest: Effort normal and breath sounds normal. No respiratory distress. She has no wheezes. She has no rales. She exhibits tenderness (L lateral ribs and over breast incision scar).  Lymphadenopathy:    She has no cervical adenopathy.  Skin: Skin is warm and dry.       Cluster of vesicles on R mid buttock          Assessment & Plan:

## 2011-04-04 ENCOUNTER — Ambulatory Visit (INDEPENDENT_AMBULATORY_CARE_PROVIDER_SITE_OTHER): Payer: Managed Care, Other (non HMO) | Admitting: Family

## 2011-04-04 ENCOUNTER — Telehealth: Payer: Self-pay | Admitting: Family Medicine

## 2011-04-04 ENCOUNTER — Encounter: Payer: Self-pay | Admitting: Family

## 2011-04-04 VITALS — BP 154/90 | HR 84 | Temp 97.9°F | Resp 18 | Wt 190.0 lb

## 2011-04-04 DIAGNOSIS — H109 Unspecified conjunctivitis: Secondary | ICD-10-CM

## 2011-04-04 MED ORDER — CIPROFLOXACIN HCL 0.3 % OP SOLN: 1.0000 [drp] | OPHTHALMIC | Status: AC

## 2011-04-04 MED ORDER — AMOXICILLIN-POT CLAVULANATE 875-125 MG PO TABS: 1.0000 | ORAL_TABLET | Freq: Two times a day (BID) | ORAL | Status: DC

## 2011-04-04 NOTE — Telephone Encounter (Signed)
Office Message [address] [place], [place] [address] p. [phone] f. [phone] To: [facility] (Daytime Triage) Fax: [phone] From: [facility] Date/ Time: 04/04/2011 8:41 AM Taken By: Caswell Corwin, RN Caller: Kris Facility: Not Collected Patient: Royann, Wildasin DOB: May 24, 1955 Phone: [phone] Reason for Call: Caller was unable to be reached on callback - Left Message Regarding Appointment: No Appt Date: Appt Time: Unknown Provider: Reason: Details: Outcome:

## 2011-04-04 NOTE — Progress Notes (Signed)
Subjective:    Patient ID: [name], female    DOB: [birth year], 56 y.o.   MRN: [id number]  HPI  Ms.  [name] is a 56 yr old female who presents today with complaint of bilateral eye crusting L>R.   She denies fever.  She reports associated nasal congestion with  dark green nasal drainage.  She was recently treated for shingles which is improved.   Review of Systems See HPI  Past Medical History  Diagnosis Date  . Sjogren's syndrome   . Hiatal hernia   . Rheumatoid arthritis   . Anxiety   . Hyperlipidemia   . Hypertension     History   Social History  . Marital Status: Married    Spouse Name: N/A    Number of Children: 1  . Years of Education: N/A   Occupational History  .      [employer] [place]  .  [employer]   Social History Main Topics  . Smoking status: Never Smoker   . Smokeless tobacco: Never Used  . Alcohol Use: No  . Drug Use: Not on file  . Sexually Active: Not on file   Other Topics Concern  . Not on file   Social History Narrative  . No narrative on file    Past Surgical History  Procedure Date  . Oophorectomy     1 ovary remain  . Vesicovaginal fistula closure w/ tah     Family History  Problem Relation Age of Onset  . Coronary artery disease Mother   . Heart attack Father   . Heart attack Paternal Grandmother   . Hypertension Mother   . Hypertension Father   . Diabetes Mother   . Stroke Maternal Grandmother   . Breast cancer Maternal Aunt     No Known Allergies  Current Outpatient Prescriptions on File Prior to Visit  Medication Sig Dispense Refill  . ALPRAZolam (XANAX) 0.25 MG tablet Take 0.5 mg by mouth 3 (three) times daily as needed.       . fluticasone (FLONASE) 50 MCG/ACT nasal spray 1 spray by Nasal route 2 (two) times daily.       Marland Kitchen HYDROcodone-acetaminophen (VICODIN) 5-500 MG per tablet Take 1 tablet by mouth at bedtime as needed.        . hydroxychloroquine (PLAQUENIL) 200 MG tablet Take 400 mg by mouth daily.         Marland Kitchen lisinopril-hydrochlorothiazide (PRINZIDE,ZESTORETIC) 20-25 MG per tablet Take 1 tablet by mouth daily.        . mirtazapine (REMERON) 15 MG tablet Take 30 mg by mouth at bedtime.       . Multiple Vitamin (MULTIVITAMIN) tablet Take 1 tablet by mouth daily.        . naproxen (NAPROSYN) 500 MG tablet Take 1 tablet (500 mg total) by mouth 2 (two) times daily with a meal.  60 tablet  0  . valACYclovir (VALTREX) 1000 MG tablet Take 1 tablet (1,000 mg total) by mouth 3 (three) times daily as needed.  21 tablet  3  . benzonatate (TESSALON) 200 MG capsule       . DULoxetine (CYMBALTA) 30 MG capsule Take 1 capsule (30 mg total) by mouth daily.  30 capsule  2  . esomeprazole (NEXIUM) 40 MG capsule Take 40 mg by mouth daily before breakfast.        . Vitamin D, Ergocalciferol, (DRISDOL) 50000 UNITS CAPS         BP 154/90  Pulse 84  Temp(Src) 97.9 F (36.6 C) (Oral)  Resp 18  Wt 190 lb (86.183 kg)  SpO2 98%       Objective:   Physical Exam  Constitutional: She appears well-developed and well-nourished. No distress.  HENT:  Head: Atraumatic.       No eye discharge noted, but sclera are mildly pink bilaterally.  PERRLA  Cardiovascular: Normal rate and regular rhythm.   No murmur heard. Pulmonary/Chest: Effort normal and breath sounds normal. No respiratory distress. She has no wheezes. She has no rales. She exhibits no tenderness.  Musculoskeletal: She exhibits no edema.  Lymphadenopathy:    She has no cervical adenopathy.  Skin: Skin is warm and dry. No erythema.  Psychiatric: She has a normal mood and affect. Her behavior is normal. Judgment and thought content normal.          Assessment & Plan:

## 2011-04-04 NOTE — Patient Instructions (Signed)
Start Augmentin if your sinus congestion worsens, or if no improvement in 2-3 days.  Call if your eye redness does not improve over the next 2-3 days.

## 2011-04-04 NOTE — Telephone Encounter (Signed)
Left message to call office

## 2011-04-05 DIAGNOSIS — H109 Unspecified conjunctivitis: Secondary | ICD-10-CM | POA: Insufficient documentation

## 2011-04-05 NOTE — Assessment & Plan Note (Signed)
Will plan to treat conjunctivitis with ciloxan.  I suspect that nasal congestion is viral at this point, however should symptoms worsen or if no improvement in 2-3 days, I have instructed her to start augmentin.

## 2011-04-13 ENCOUNTER — Encounter: Payer: Self-pay | Admitting: *Deleted

## 2011-04-13 ENCOUNTER — Ambulatory Visit (INDEPENDENT_AMBULATORY_CARE_PROVIDER_SITE_OTHER): Payer: Managed Care, Other (non HMO) | Admitting: Family Medicine

## 2011-04-13 ENCOUNTER — Encounter: Payer: Self-pay | Admitting: Family Medicine

## 2011-04-13 VITALS — BP 138/84 | HR 94 | Temp 98.2°F | Ht 63.75 in | Wt 189.8 lb

## 2011-04-13 DIAGNOSIS — R799 Abnormal finding of blood chemistry, unspecified: Secondary | ICD-10-CM

## 2011-04-13 DIAGNOSIS — K449 Diaphragmatic hernia without obstruction or gangrene: Secondary | ICD-10-CM

## 2011-04-13 DIAGNOSIS — R1013 Epigastric pain: Secondary | ICD-10-CM

## 2011-04-13 DIAGNOSIS — I10 Essential (primary) hypertension: Secondary | ICD-10-CM

## 2011-04-13 LAB — BASIC METABOLIC PANEL
BUN: 12 mg/dL (ref 6–23)
Calcium: 9.6 mg/dL (ref 8.4–10.5)
Creatinine, Ser: 0.6 mg/dL (ref 0.4–1.2)
GFR: 126.75 mL/min (ref >60.00)
Glucose, Bld: 86 mg/dL (ref 70–99)

## 2011-04-13 LAB — CBC WITH DIFFERENTIAL/PLATELET
Basophils Relative: 0.3 % (ref 0.0–3.0)
Eosinophils Relative: 0.8 % (ref 0.0–5.0)
HCT: 36.1 % (ref 36.0–46.0)
Lymphs Abs: 1.7 10*3/uL (ref 0.7–4.0)
MCV: 85.3 fl (ref 78.0–100.0)
Monocytes Absolute: 1 10*3/uL (ref 0.1–1.0)
Neutro Abs: 3.8 10*3/uL (ref 1.4–7.7)
Platelets: 271 10*3/uL (ref 150.0–400.0)
RBC: 4.23 Mil/uL (ref 3.87–5.11)
WBC: 6.5 10*3/uL (ref 4.5–10.5)

## 2011-04-13 LAB — H. PYLORI ANTIBODY, IGG: H Pylori IgG: NEGATIVE

## 2011-04-13 LAB — LIPASE: Lipase: 26 U/L (ref 11.0–59.0)

## 2011-04-13 LAB — HEPATIC FUNCTION PANEL: Albumin: 3.7 g/dL (ref 3.5–5.2)

## 2011-04-13 LAB — AMYLASE: Amylase: 31 U/L (ref 27–131)

## 2011-04-13 NOTE — Progress Notes (Signed)
  Subjective:    Patient ID: [name], female    DOB: 1955/03/23, 56 y.o.   MRN: [id number]  HPI HTN- chronic problem, was elevated at last check.  Improved today.  Denies CP, SOB, HAs, visual changes, edema.  Epigastric pain- pt has known hiatal hernia, coughing frequently and severely.  Denies GERD.  Will occur 'a couple times/week'.  No relation to food.  'it will stop you dead in your tracks'.  No nausea.  Pain will radiate to L side.  Episodes will last 30 seconds to 1 minute.   Review of Systems For ROS see HPI     Objective:   Physical Exam  Constitutional: She is oriented to person, place, and time. She appears well-developed and well-nourished. No distress.  HENT:  Head: Normocephalic and atraumatic.  Eyes: Conjunctivae and EOM are normal. Pupils are equal, round, and reactive to light.  Neck: Normal range of motion. Neck supple. No thyromegaly present.  Cardiovascular: Normal rate, regular rhythm, normal heart sounds and intact distal pulses.   No murmur heard. Pulmonary/Chest: Effort normal and breath sounds normal. No respiratory distress.  Abdominal: Soft. Bowel sounds are normal. She exhibits no distension and no mass. There is no tenderness. There is no rebound and no guarding.  Musculoskeletal: She exhibits no edema.  Lymphadenopathy:    She has no cervical adenopathy.  Neurological: She is alert and oriented to person, place, and time.  Skin: Skin is warm and dry.  Psychiatric: She has a normal mood and affect. Her behavior is normal.          Assessment & Plan:

## 2011-04-13 NOTE — Patient Instructions (Signed)
We'll notify you of your lab results Your BP is much better than last time Double the OTC Omeprazole to 2 tabs daily If you have another episode- please call and we'll get you a CT scan Call with any questions or concerns Hang in there!!!

## 2011-04-14 DIAGNOSIS — Z0279 Encounter for issue of other medical certificate: Secondary | ICD-10-CM

## 2011-04-18 ENCOUNTER — Telehealth: Payer: Self-pay | Admitting: Hematology & Oncology

## 2011-04-18 NOTE — Telephone Encounter (Signed)
Left patient message to call and schedule appointment 

## 2011-04-18 NOTE — Telephone Encounter (Signed)
Pt aware of 05-13-11 appointment

## 2011-04-20 ENCOUNTER — Encounter: Payer: Self-pay | Admitting: Family Medicine

## 2011-04-27 NOTE — Assessment & Plan Note (Signed)
New.  Pt's PE WNL, no obvious pain or abnormality on PE.  Check labs to r/o pancreatitis, H pylori, liver abnormality, metabolic abnormality.  If sxs recur, will get CT scan and refer to GI.  Reviewed supportive care and red flags that should prompt return.  Pt expressed understanding and is in agreement w/ plan.

## 2011-04-27 NOTE — Assessment & Plan Note (Signed)
Chronic problem.  Better control today.  Asymptomatic.  No changes.

## 2011-04-27 NOTE — Assessment & Plan Note (Signed)
Pt again having sxs consistent w/ GERD.  Double PPI to 40mg .  Pt expressed understanding and is in agreement w/ plan.

## 2011-05-09 ENCOUNTER — Ambulatory Visit
Admission: RE | Admit: 2011-05-09 | Discharge: 2011-05-09 | Disposition: A | Discharge status: Home / Self Care | Payer: Managed Care, Other (non HMO) | Source: Ambulatory Visit | Attending: Family Medicine | Admitting: Family Medicine

## 2011-05-09 DIAGNOSIS — Z1231 Encounter for screening mammogram for malignant neoplasm of breast: Secondary | ICD-10-CM

## 2011-05-10 ENCOUNTER — Encounter: Payer: Managed Care, Other (non HMO) | Admitting: Family Medicine

## 2011-05-13 ENCOUNTER — Ambulatory Visit (HOSPITAL_BASED_OUTPATIENT_CLINIC_OR_DEPARTMENT_OTHER): Payer: Managed Care, Other (non HMO) | Admitting: Hematology & Oncology

## 2011-05-13 ENCOUNTER — Ambulatory Visit (HOSPITAL_BASED_OUTPATIENT_CLINIC_OR_DEPARTMENT_OTHER): Payer: Managed Care, Other (non HMO)

## 2011-05-13 ENCOUNTER — Other Ambulatory Visit (HOSPITAL_BASED_OUTPATIENT_CLINIC_OR_DEPARTMENT_OTHER): Payer: Managed Care, Other (non HMO) | Admitting: Lab

## 2011-05-13 VITALS — BP 148/99 | HR 65 | Temp 97.9°F | Ht 63.0 in | Wt 189.0 lb

## 2011-05-13 DIAGNOSIS — D72821 Monocytosis (symptomatic): Secondary | ICD-10-CM

## 2011-05-13 DIAGNOSIS — M3500 Sicca syndrome, unspecified: Secondary | ICD-10-CM

## 2011-05-13 LAB — IGG, IGA, IGM
IgA: 186 mg/dL (ref 69–380)
IgG (Immunoglobin G), Serum: 2960 mg/dL — ABNORMAL HIGH (ref 690–1700)
IgM, Serum: 50 mg/dL — ABNORMAL LOW (ref 52–322)

## 2011-05-13 LAB — CHCC SATELLITE - SMEAR

## 2011-05-13 LAB — CBC WITH DIFFERENTIAL (CANCER CENTER ONLY)
BASO%: 0.4 % (ref 0.0–2.0)
LYMPH%: 23.4 % (ref 14.0–48.0)
MCV: 86 fL (ref 81–101)
MONO#: 0.7 10*3/uL (ref 0.1–0.9)
NEUT#: 3.2 10*3/uL (ref 1.5–6.5)
Platelets: 229 10*3/uL (ref 145–400)
RDW: 12.8 % (ref 11.1–15.7)
WBC: 5.2 10*3/uL (ref 3.9–10.0)

## 2011-05-13 NOTE — Progress Notes (Signed)
This office note has been dictated.

## 2011-05-13 NOTE — Progress Notes (Signed)
CC:   [name], M.D.  DIAGNOSIS: 1. Monocytosis. 2. Elevated serum protein.  HISTORY OF PRESENT ILLNESS:  [name] is a very charming 56 year old African American female.  She is originally from [place].  She has been down in [place] for about 10 years.  She works for [employer] [place].  She has been followed by Dr. Beverely Low.  Dr. Beverely Low has been doing lab work on her.  She does have Sjogren's.  She is on Plaquenil.  She does take eyedrops for this.  I think she is followed out at [facility].  She has been having lab work done on a routine basis.  She has been noted to have elevated serum protein.  Back in March, her serum protein was 9.2 with an albumin of 7.3.  Going back to 2011, her serum protein was 9.5 with an albumin of 4.1. She has no problem with renal function.  Her kidneys are okay.  She has been also noted to have some monocytosis.  Again, lab work done back in March showed a white cell count of 6.5, hemoglobin of 12, hematocrit 36 and platelet count 271.  White cell differential showed 58 segs, 25 lymphs, 15 monos.  Going back to 2012, she had 16% monocytes.  She has not had any problems with infections.  Her Sjogren's has not had any flare-ups.  Again, she is on Plaquenil.  She has not noticed any kind of rashes.  She has had no cough.  There has been no change in bowel or bladder habits.  She does state some occasional sweats.  She did have a hysterectomy with 1 ovary left behind.  This may be indicative of menopausal state.  She has had no leg swelling.  There has been no swollen lymph glands. She has had no weight loss or weight gain.  Dr. Beverely Low kindly referred her to the [facility] for evaluation.  PAST MEDICAL HISTORY: 1. Remarkable for the Sjogren syndrome. 2. Hypertension. 3. Uterine fibroids, status post hysterectomy with 1 ovary removed.  ALLERGIES:  Amitriptyline.  MEDICATIONS:  Xanax 0.5 mg t.i.d. p.r.n.,  Zestoretic (20/25) 1 p.o. daily, Remeron 30 mg p.o. q.h.s., Prilosec 20 mg p.o. daily, valacyclovir 1000 mg p.o. t.i.d. p.r.n., Plaquenil 400 mg p.o. daily, Vicodin (5/500) 1 p.o. q.h.s. p.r.n.  SOCIAL HISTORY:  Negative for tobacco or alcohol use.  She has no obvious occupational exposures.  FAMILY HISTORY:  Remarkable for coronary artery disease.  There is no history of collagen vascular disease in the family.  REVIEW OF SYSTEMS:  As stated in history of present illness.  PHYSICAL EXAMINATION:  General:  This is a well-developed, well- nourished black female in no obvious distress.  Vital Signs: Temperature 97, pulse 65, respiratory rate 18, blood pressure 148/99. Weight is 189.  Head and Neck Exam:  Shows a normocephalic, atraumatic skull.  There are no ocular or oral lesions.  There are no palpable cervical or supraclavicular lymph nodes.  Lungs:  Clear to percussion and auscultation bilaterally.  Cardiac Exam:  Regular rate and rhythm with a normal S1 and S2.  There are no murmurs, rubs or bruits. Abdominal Exam:  Soft with good bowel sounds.  There is no fluid wave. There is no abdominal mass.  There is no palpable hepatosplenomegaly. Extremities:  Show no clubbing, cyanosis, or edema.  She has good range of motion of her joints.  Skin Exam:  No rashes, ecchymosis or petechiae.  Neurological Exam:  Shows no focal  neurological deficits.  LABORATORY STUDIES:  White cell count is 5.2, hemoglobin 11.6, hematocrit 35.5, platelet count 229.  MCV is 86.  White cell differential shows 61 segs, 23 lymphs, 14 monos and 1 eosinophil.  Peripheral smear shows a normochromic, normocytic population of red blood cells.  There are no nucleated red blood cells.  I see no target cells.  There is no rouleaux formation.  She has no schistocytes.  There are no spherocytes.  White cells appear normal in morphology and maturation.  There are no hypersegmented polys.  I see no immature myeloid  cells.  She has minimal increase in monocytes.  The monocytes appear mature.  There are no atypical lymphocytes.  Platelets are adequate in size and maturity.  IMPRESSION:  [name] is a very charming 56 year old African American female with monocytosis.  She also has an elevated protein.  I have to believe that this is all part and parcel related to her having this Sjogren's.  I think having an underlying collagen vascular disease would certainly increase the risk for monoclonal gammopathy of undetermined significance.  We did send off a serum protein electrophoresis.  If positive, I will see what the monoclonal spike is.  If she does have an monoclonal gammopathy of undetermined significance, this can be followed by Dr. Beverely Low every 6 months.  I do not see any obvious indicator for a plasma cell disorder.  I know with Sjogren's, there might be an increased risk for lymphoproliferative diseases.  Her physical exam certainly did not give me any indication of an underlying lymphoproliferative problem.  I believe that for now we can just have Dr. Beverely Low follow up with [name].  I just do not think that we would be adding much due to her medical care at this point in time.  I spent a good hour with [name].  We had good fellowship.  I gave her a prayer blanket so that she knows that she has our prayers with her.   ______________________________ Josph Macho, M.D. PRE/MEDQ  D:  05/13/2011  T:  05/13/2011  Job:  [id number]

## 2011-05-17 LAB — IGG, IGA, IGM
IgA: 186 mg/dL (ref 69–380)
IgG (Immunoglobin G), Serum: 2960 mg/dL — ABNORMAL HIGH (ref 690–1700)
IgM, Serum: 50 mg/dL — ABNORMAL LOW (ref 52–322)

## 2011-05-17 LAB — PROTEIN ELECTROPHORESIS, SERUM, WITH REFLEX
Alpha-1-Globulin: 4.1 % (ref 2.9–4.9)
Alpha-2-Globulin: 10.2 % (ref 7.1–11.8)
Beta Globulin: 5 % (ref 4.7–7.2)
Total Protein, Serum Electrophoresis: 7.2 g/dL (ref 6.0–8.3)

## 2011-05-18 ENCOUNTER — Telehealth: Payer: Self-pay | Admitting: Family Medicine

## 2011-05-18 MED ORDER — LISINOPRIL-HYDROCHLOROTHIAZIDE 20-25 MG PO TABS: 1.0000 | ORAL_TABLET | Freq: Every day | ORAL | Status: DC

## 2011-05-18 NOTE — Telephone Encounter (Signed)
Refill: Lisinopril-hctz 20-25 mg tab #30. Take 1 tablet by mouth once a day. Last fill 02-03-11

## 2011-05-18 NOTE — Telephone Encounter (Signed)
Rx sent 

## 2011-05-23 ENCOUNTER — Encounter: Payer: Self-pay | Admitting: Family Medicine

## 2011-05-24 MED ORDER — LISINOPRIL-HYDROCHLOROTHIAZIDE 20-25 MG PO TABS: 1.0000 | ORAL_TABLET | Freq: Every day | ORAL | Status: DC

## 2011-05-24 NOTE — Telephone Encounter (Signed)
Medication sent by another staff member.

## 2011-05-25 ENCOUNTER — Telehealth: Payer: Self-pay | Admitting: *Deleted

## 2011-05-25 NOTE — Telephone Encounter (Signed)
Message copied by Anselm Jungling on Wed May 25, 2011  2:15 PM ------      Message from: Arlan Organ R      Created: Mon May 23, 2011  9:04 PM       Call - labs look ok.  No abnormal protein is seen. pete

## 2011-05-25 NOTE — Telephone Encounter (Signed)
Called patient to let her know that her labwork looked good. No abnormal protein seen per dr. Myna Hidalgo. Left message on personal cell phone answering machine

## 2011-05-26 ENCOUNTER — Telehealth: Payer: Self-pay | Admitting: Family Medicine

## 2011-05-26 ENCOUNTER — Ambulatory Visit: Payer: Managed Care, Other (non HMO) | Admitting: Pulmonary Disease

## 2011-05-26 NOTE — Telephone Encounter (Signed)
Pt states pharmacy never received rx for lisinopril. It shows we sent it on 05/24/11.  Pt is very upset. Could you please re-send it? Pt states she is completely out.

## 2011-05-26 NOTE — Telephone Encounter (Signed)
[facility] [address] in [place] per verified with pt  Prior to calling, spoke to Tim whom advised that they DID received the rx for lisinopril for the pt on 05-24-11 however the rx is on hold, he was not sure why it was on hold, possibly per too early to fill it, Tim did advise that the Rx is going through now and that the pt can come to pick her rx up as soon as they can get it ready. Called pt to advise, she understood

## 2011-06-06 ENCOUNTER — Telehealth: Payer: Self-pay | Admitting: Family Medicine

## 2011-06-06 MED ORDER — VALACYCLOVIR HCL 1 G PO TABS: 1000.0000 mg | ORAL_TABLET | Freq: Three times a day (TID) | ORAL | Status: DC

## 2011-06-06 NOTE — Telephone Encounter (Signed)
Caller: C[place]ne/Patient; PCP: Sheliah Hatch.; CB#: [phone]; Call regarding Sinus Pain; Chills/ subjective fever.  Temp 100 on 06/03/11. Coughing and has yellow/ green nasal secretions.  Call provider immediately per Face Pain or Swelling protocol.  Information noted and sent to Dr. Beverely Low for review and follow up as office currently open.  Caller requests Rx for same; [facility] [address] [facility]  . She also relates pain in left side and that she was to call Dr. Beverely Low when this happened.

## 2011-06-06 NOTE — Telephone Encounter (Signed)
Spoke to pt and she advised that she has a recurrent episode from her shingles accompanied by the possible sinus infection, sent RX refill for valtrex 1000mg  TID X 7days #21 per MD Beverely Low verbal order to pt pharmacy [facility] [place], pt accepted apt for tomorrow 06-07-11 at 11:30am, MD Beverely Low noted verbally

## 2011-06-06 NOTE — Telephone Encounter (Signed)
Pt needs appt- can be seen at 11:30am tomorrow w/ me or by someone else at another time

## 2011-06-07 ENCOUNTER — Encounter: Payer: Self-pay | Admitting: Family Medicine

## 2011-06-07 ENCOUNTER — Ambulatory Visit (INDEPENDENT_AMBULATORY_CARE_PROVIDER_SITE_OTHER): Payer: Managed Care, Other (non HMO) | Admitting: Family Medicine

## 2011-06-07 VITALS — BP 125/80 | HR 98 | Temp 98.2°F | Ht 64.0 in | Wt 187.2 lb

## 2011-06-07 DIAGNOSIS — J019 Acute sinusitis, unspecified: Secondary | ICD-10-CM | POA: Insufficient documentation

## 2011-06-07 DIAGNOSIS — B029 Zoster without complications: Secondary | ICD-10-CM

## 2011-06-07 DIAGNOSIS — K219 Gastro-esophageal reflux disease without esophagitis: Secondary | ICD-10-CM

## 2011-06-07 MED ORDER — OMEPRAZOLE 40 MG PO CPDR: 40.0000 mg | DELAYED_RELEASE_CAPSULE | Freq: Every day | ORAL | Status: DC

## 2011-06-07 MED ORDER — VALACYCLOVIR HCL 1 G PO TABS: 1000.0000 mg | ORAL_TABLET | Freq: Every day | ORAL | Status: DC

## 2011-06-07 MED ORDER — AMOXICILLIN-POT CLAVULANATE 875-125 MG PO TABS: 1.0000 | ORAL_TABLET | Freq: Two times a day (BID) | ORAL | Status: AC

## 2011-06-07 NOTE — Patient Instructions (Signed)
Take the Augmentin for the sinus infection- take w/ food Mucinex to thin your congestion Once you complete your Valtrex 3x/day, start the daily valtrex to try and suppress the shingles virus Increase the Omeprazole to 40mg  daily (you have a new script) Call with any questions or concerns Hang in there!!!

## 2011-06-07 NOTE — Assessment & Plan Note (Signed)
Recurrent issue.  Currently taking tx dose of Valtrex 1gm TID x7 days.  After this, will start suppressive dose of 1 gram daily (although this is off-label for shingles) due to pt's immunosuppression.  Will follow.

## 2011-06-07 NOTE — Assessment & Plan Note (Signed)
New.  Hiatal hernia is likely cause.  sxs not controlled on Omeprazole 20mg  daily.  Increase to 40mg  daily and follow.

## 2011-06-07 NOTE — Progress Notes (Signed)
  Subjective:    Patient ID: [name], female    DOB: 17-Nov-1955, 56 y.o.   MRN: [id number]  HPI Shingles- recurrent problem, again having burning and pain on buttock but no rash apparent.  Started Valtrex yesterday.  Is awaiting layoffs at work.  Sinusitis- sxs started yesterday w/ nasal congestion, cough productive of 'dark yellow' sputum.  + facial pain.  Tm 101.  + chills.  + sick contacts.  GERD- having increased sxs.  On Omeprazole 20mg  daily.  Has hiatal hernia.  Review of Systems     Objective:   Physical Exam  Vitals reviewed. Constitutional: She appears well-developed and well-nourished. No distress.  HENT:  Head: Normocephalic and atraumatic.  Right Ear: Tympanic membrane normal.  Left Ear: Tympanic membrane normal.  Nose: Mucosal edema and rhinorrhea present. Right sinus exhibits maxillary sinus tenderness and frontal sinus tenderness. Left sinus exhibits maxillary sinus tenderness and frontal sinus tenderness.  Mouth/Throat: Uvula is midline and mucous membranes are normal. Posterior oropharyngeal erythema present. No oropharyngeal exudate.  Eyes: Conjunctivae and EOM are normal. Pupils are equal, round, and reactive to light.  Neck: Normal range of motion. Neck supple.  Cardiovascular: Normal rate, regular rhythm and normal heart sounds.   Pulmonary/Chest: Effort normal and breath sounds normal. No respiratory distress. She has no wheezes.  Abdominal: Bowel sounds are normal. She exhibits no distension. There is no tenderness. There is no rebound and no guarding.  Lymphadenopathy:    She has no cervical adenopathy.  Skin: Skin is warm and dry. No rash noted.          Assessment & Plan:

## 2011-06-07 NOTE — Assessment & Plan Note (Signed)
New.  Pt's sxs and PE consistent w/ infxn.  Start abx.  Reviewed supportive care and red flags that should prompt return.  Pt expressed understanding and is in agreement w/ plan.  

## 2011-06-08 DIAGNOSIS — D892 Hypergammaglobulinemia, unspecified: Secondary | ICD-10-CM | POA: Insufficient documentation

## 2011-06-08 DIAGNOSIS — M3500 Sicca syndrome, unspecified: Secondary | ICD-10-CM | POA: Insufficient documentation

## 2011-06-22 ENCOUNTER — Other Ambulatory Visit: Payer: Self-pay | Admitting: *Deleted

## 2011-06-22 NOTE — Telephone Encounter (Signed)
Noted incoming fax for a prior authorization for pt omeprazole, form filled out and faxed back, awaiting results

## 2011-06-23 ENCOUNTER — Other Ambulatory Visit: Payer: Self-pay | Admitting: *Deleted

## 2011-06-23 MED ORDER — OMEPRAZOLE 40 MG PO CPDR: 40.0000 mg | DELAYED_RELEASE_CAPSULE | Freq: Every day | ORAL | Status: DC

## 2011-06-23 NOTE — Telephone Encounter (Signed)
Received a incoming fax from [facility] noting the following:  After reviewing your prescription benefit plan's established and approved criteria for this drug (omeprazole) with your doctor, the request was approved for the following time period:  06/22/2011-06/22/2014  Sent Rx to the pharmacy per advisement via escribe

## 2011-07-18 ENCOUNTER — Ambulatory Visit (INDEPENDENT_AMBULATORY_CARE_PROVIDER_SITE_OTHER): Payer: Managed Care, Other (non HMO) | Admitting: Pulmonary Disease

## 2011-07-18 ENCOUNTER — Encounter: Payer: Self-pay | Admitting: Pulmonary Disease

## 2011-07-18 VITALS — BP 156/90 | HR 87 | Temp 98.2°F | Ht 64.0 in | Wt 197.9 lb

## 2011-07-18 DIAGNOSIS — M3500 Sicca syndrome, unspecified: Secondary | ICD-10-CM

## 2011-07-18 DIAGNOSIS — R059 Cough, unspecified: Secondary | ICD-10-CM | POA: Insufficient documentation

## 2011-07-18 DIAGNOSIS — R05 Cough: Secondary | ICD-10-CM

## 2011-07-18 DIAGNOSIS — R0602 Shortness of breath: Secondary | ICD-10-CM

## 2011-07-18 NOTE — Patient Instructions (Signed)
Your cough may be related to Lisinopril or GERD  Ct scan in oct'12 looks better Lung function is adequate

## 2011-07-18 NOTE — Assessment & Plan Note (Signed)
Lisinopril or GERD related Less likely RA/ sjogrens She feels lisinopril has worked well for her BP, hence does not want to discontinue & that is [facility]

## 2011-07-18 NOTE — Progress Notes (Signed)
  Subjective:    Patient ID: [name], female    DOB: 1955/09/25, 56 y.o.   MRN: [id number]  HPI PCP : Beverely Low  GI : gessner  Rheum - Deveshwar  Initial OV 08/07/09  54/F never smoker with Sjogren's & RA for FU of dyspnea on exertion.  She reports a band like pain in her chest & middle back x 3 mnths, with dyspne aon exertion & cough productive of yellow thick mucus especially in am. She can walk 1/2 mile in the park. Her rheumatologist is Dr Carley Hammed at [facility]. CXR 08/04/09 was wnl. Ctc hest 07/17/09 showed mild central bronchiectasis with interstitio-alveolar infiltrate in medial right lung base. Small hiatal hernia & fatty liver were noted.  Lisinopril is noted on med review, stable on this x 5 yrs.  Does admit to symptoms of anxiety  >> Not impressed by the degree of pulmonary infiltrates  Bronchiectasis appears to be mild  No evidence of pulmonary hypertension.   06/07/2010  ER visit in 4/12 for band like chest pain  Cough with mucus - oTC meds, honey, lemon  Has been started on low dose benzos for anxiety by Psych    07/18/2011 1 yr FU Pt states no change in cough since last seen, pt states cough is still present at times, with some production. pt c/o some left mid chest pain to left-sides pain. pt states she has had some spells of coughing up blood since last OV (march 2013)- "mucus with little clots"-Saw Dr Beverely Low On plaquanil for RA Chest pain  Intermittent lt side, on lying down, non exertional  On Omez 40 - reflux worse CT angio 1/12 & again in 4/12 showed stable hiatal hernia & ground glass densities in RUL & LLL - likely post inflammatory.  Ct chest fu 10/12 improved infx Spirometry showed   Review of Systems Patient denies significant dyspnea,cough, hemoptysis,  chest pain, palpitations, pedal edema, orthopnea, paroxysmal nocturnal dyspnea, lightheadedness, nausea, vomiting, abdominal or  leg pains      Objective:   Physical Exam  Gen. Pleasant, well-nourished, in no  distress ENT - no lesions, no post nasal drip Neck: No JVD, no thyromegaly, no carotid bruits Lungs: no use of accessory muscles, no dullness to percussion, clear without rales or rhonchi  Cardiovascular: Rhythm regular, heart sounds  normal, no murmurs or gallops, no peripheral edema Musculoskeletal: No deformities, no cyanosis or clubbing         Assessment & Plan:

## 2011-07-18 NOTE — Assessment & Plan Note (Signed)
No significant interstitial disease in the lung

## 2011-07-27 ENCOUNTER — Telehealth: Payer: Self-pay | Admitting: Family Medicine

## 2011-07-27 ENCOUNTER — Encounter: Payer: Self-pay | Admitting: Family Medicine

## 2011-07-27 NOTE — Telephone Encounter (Signed)
Notation of conversation via MY chart message: Need to clarify the pharmacy the pt wants the new RX sent to per shingles, and schedule a OV to discuss anxiety, this nurse called and left vm at pt home/mobile and work number  Pt can have Valtrex 1 gram TID x7 days for the shingles. Yes, her anxiety has been poorly controlled for some time. We have discussed both counseling and meds in the past (tried Cymbalta but she stopped this) Agree that she needs daily controller med PLUS counseling. Will need appt to discuss. -----  Message ----- From: Myer Haff Sent: 07/27/2011 10:36 AM To: Sheliah Hatch, MD Subject: Non-Urgent Medical Question ----- Message from Myer Haff to Sheliah Hatch, MD sent at 07/27/2011 7:07 AM -----   Good morning Dr Beverely Low, I have another shingles outbreak. I aslo went to [facility] on Monday and after speaking with doctor's there they say my aniexty is out of control and I need to see someone about that asap. My stress level is out of control an I need to get it under control. I was told to get with my primary care doctor and seek some good help on this matter.

## 2011-07-27 NOTE — Telephone Encounter (Signed)
Opened in error

## 2011-07-27 NOTE — Telephone Encounter (Signed)
FYI: Please note pt concerns, will call pt to set up apt if you have any preference as to when please advise

## 2011-07-28 ENCOUNTER — Telehealth: Payer: Self-pay | Admitting: Family Medicine

## 2011-07-28 MED ORDER — VALACYCLOVIR HCL 1 G PO TABS: 1000.0000 mg | ORAL_TABLET | Freq: Three times a day (TID) | ORAL | Status: AC

## 2011-07-28 NOTE — Telephone Encounter (Signed)
Left another VM to advise that a my chart message has been sent to the pt to advise she needs to contact office, per needs to clarify the pharmacy to send medications to as well as schedule an OV to speak with MD Tabori about anxiety concern noted in my chart message, spoke with MD Beverely Low and gave verbal order to send medication Valtrax to pharmacy noted in chart in order for pt to get her medication asap per unable to contact, sent 1000mg  (1GM) valtrex TID for 7 days due [facility]ingles [facility] [place] [facility] escribe

## 2011-07-28 NOTE — Telephone Encounter (Signed)
Caller: /Patient; Phone Number: [phone]; Message from caller: Patient is returning call from office yesterday 07/27/11.  Patient states [name] called her and she is following up.  She has an outbreak of shingles.  She is requesting medication for the outbreak.  Declines Triage.  She states she SENT AN EMAIL to the office regarding all her concerns.  Please contact patient and read email she sent to physician prior to call.

## 2011-07-28 NOTE — Telephone Encounter (Signed)
Left vm on pt phone to clarify the pharmacy that she wants MD Tabori to send the medication to, as well as to advise she needs to call office to schedule an apt per anxiety, sent pt Mychart message to call office as advised in prior vm

## 2011-07-29 ENCOUNTER — Ambulatory Visit: Payer: Managed Care, Other (non HMO) | Admitting: Family Medicine

## 2011-07-29 ENCOUNTER — Encounter: Payer: Self-pay | Admitting: Family Medicine

## 2011-07-29 ENCOUNTER — Ambulatory Visit (INDEPENDENT_AMBULATORY_CARE_PROVIDER_SITE_OTHER): Payer: Managed Care, Other (non HMO) | Admitting: Family Medicine

## 2011-07-29 VITALS — BP 132/80 | HR 100 | Temp 97.7°F | Ht 64.25 in | Wt 200.8 lb

## 2011-07-29 DIAGNOSIS — B029 Zoster without complications: Secondary | ICD-10-CM

## 2011-07-29 DIAGNOSIS — F411 Generalized anxiety disorder: Secondary | ICD-10-CM

## 2011-07-29 MED ORDER — BUPROPION HCL ER (XL) 150 MG PO TB24: 150.0000 mg | ORAL_TABLET | Freq: Every day | ORAL | Status: DC

## 2011-07-29 NOTE — Telephone Encounter (Signed)
Pt coming in office today for discussion about anxiety with MD Tabori, also alerted via my chart message to pick up new RX for valtrex.

## 2011-07-29 NOTE — Progress Notes (Signed)
  Subjective:    Patient ID: [name], female    DOB: [birth year], 56 y.o.   MRN: [id number]  HPI Shingles- has recurrent outbreak on buttock.  Picked up Valtrex yesterday and started meds.  Was told by specialist at [facility]) that she needs to get anxiety under control.  This is discussion we've had many times.  Pt had panic attack in dr's office.  Is now open to idea of meds- has tried Cymbalta in the past and 'it made me whacked'.  'it's too much going on'.  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   Review of Systems For ROS see HPI     Objective:   Physical Exam  Vitals reviewed. Constitutional: She appears well-developed and well-nourished. No distress.  Skin: Skin is warm and dry. Rash (vesicular rash consistent w/ shingles) noted.  Psychiatric:       Flat affect, somewhat withdrawn.          Assessment & Plan:

## 2011-07-29 NOTE — Patient Instructions (Addendum)
Follow up in 6 weeks to recheck mood Start the Wellbutrin daily Start counseling for the anxiety Call with any questions or concerns Hang in there!!!

## 2011-07-31 NOTE — Assessment & Plan Note (Signed)
Recurrent issue.  Likely due to high stress/anxiety.  Valtrex called in yesterday- pt already taking.

## 2011-07-31 NOTE — Assessment & Plan Note (Addendum)
Deteriorated.  Pt now ready for daily controller med and counseling after having panic attack at MD office at [facility].  Pt did not like feeling of Cymbalta.  Will start Wellbutrin and assess for improvement in sxs.  Pt also given names and #s of therapists.  Will follow closely.

## 2011-10-24 DIAGNOSIS — Z0279 Encounter for issue of other medical certificate: Secondary | ICD-10-CM

## 2011-10-27 ENCOUNTER — Telehealth: Payer: Self-pay | Admitting: Family Medicine

## 2011-10-27 NOTE — Telephone Encounter (Signed)
Called pt to advise there are no more apt available today and per MD Tabori, the pt will need to seek evaluation at the ER or UC based on concerns with current sxs/enlarged ventricle, pt understood and stated that she will get her husband to take her to the ER. MD Beverely Low made aware verbally

## 2011-10-27 NOTE — Telephone Encounter (Signed)
PLEASE CALL [name] AT [phone]- HAS ED DISPOSITION - NO APPTS AVAILABLE IN EPIC [name] states she had onset of cough, hoarsness and headache on 10/25/11. Coughing up lots of green mucus and having dark brown drainage from nose.Headache when she coughs. Having outbreak of shingles on right upper buttock. Hoarseness. Chest hurts due to chest pain. Chills on 10/27/11. Per cough protocol has ED disposition due to Worsening cough and known cardiac condition ( enlarged ventricle) and not responding to treatment. OT 100. No appt left in EPIC . Please notify [name] if office able to make a  work-in appointment.Has Rheumatoid Arthritis and Sjogrens. Care advice given.

## 2011-10-28 ENCOUNTER — Emergency Department (INDEPENDENT_AMBULATORY_CARE_PROVIDER_SITE_OTHER)
Admission: EM | Admit: 2011-10-28 | Discharge: 2011-10-28 | Disposition: A | Discharge status: Home / Self Care | Payer: Managed Care, Other (non HMO) | Source: Home / Self Care | Attending: Family Medicine | Admitting: Family Medicine

## 2011-10-28 ENCOUNTER — Encounter (HOSPITAL_COMMUNITY): Payer: Self-pay | Admitting: Emergency Medicine

## 2011-10-28 DIAGNOSIS — J019 Acute sinusitis, unspecified: Secondary | ICD-10-CM

## 2011-10-28 MED ORDER — AMOXICILLIN-POT CLAVULANATE 875-125 MG PO TABS: 1.0000 | ORAL_TABLET | Freq: Two times a day (BID) | ORAL | Status: DC

## 2011-10-28 NOTE — ED Provider Notes (Signed)
History     CSN: [id number]  Arrival date & time 10/28/11  4259   First MD Initiated Contact with Patient 10/28/11 [phone]      Chief Complaint  Patient presents with  . Sinusitis    (Consider location/radiation/quality/duration/timing/severity/associated sxs/prior treatment) Patient is a 56 y.o. female presenting with sinusitis. The history is provided by the patient.  Sinusitis  This is a recurrent problem. The current episode started more than 2 days ago. The problem has not changed since onset.The maximum temperature recorded prior to her arrival was 100 to 100.9 F. The patient is experiencing no pain. Associated symptoms include congestion, sore throat and cough. Pertinent negatives include no chills.    Past Medical History  Diagnosis Date  . Sjogren's syndrome   . Hiatal hernia   . Rheumatoid arthritis   . Anxiety   . Hyperlipidemia   . Hypertension     Past Surgical History  Procedure Date  . Oophorectomy     1 ovary remain  . Vesicovaginal fistula closure w/ tah     Family History  Problem Relation Age of Onset  . Coronary artery disease Mother   . Heart attack Father   . Heart attack Paternal Grandmother   . Hypertension Mother   . Hypertension Father   . Diabetes Mother   . Stroke Maternal Grandmother   . Breast cancer Maternal Aunt     History  Substance Use Topics  . Smoking status: Never Smoker   . Smokeless tobacco: Never Used  . Alcohol Use: No    OB History    Grav Para Term Preterm Abortions TAB SAB Ect Mult Living                  Review of Systems  Constitutional: Negative for chills.  HENT: Positive for nosebleeds, congestion, sore throat, rhinorrhea and postnasal drip.   Respiratory: Positive for cough.     Allergies  Amitriptyline  Home Medications   Current Outpatient Rx  Name Route Sig Dispense Refill  . BUPROPION HCL ER (XL) 150 MG PO TB24 Oral Take 1 tablet (150 mg total) by mouth daily. 30 tablet 3  .  HYDROXYCHLOROQUINE SULFATE 200 MG PO TABS Oral Take 400 mg by mouth daily.      Marland Kitchen LISINOPRIL-HYDROCHLOROTHIAZIDE 20-25 MG PO TABS Oral Take 1 tablet by mouth daily. 30 tablet 2  . AMITRIPTYLINE HCL 25 MG PO TABS      . AMOXICILLIN-POT CLAVULANATE 875-125 MG PO TABS Oral Take 1 tablet by mouth 2 (two) times daily. 20 tablet 0  . FLUTICASONE PROPIONATE 50 MCG/ACT NA SUSP Nasal 1 spray by Nasal route 2 (two) times daily.     Marland Kitchen MIRTAZAPINE 15 MG PO TABS Oral Take 30 mg by mouth at bedtime.     Marland Kitchen ONE-DAILY MULTI VITAMINS PO TABS Oral Take 1 tablet by mouth daily.      Marland Kitchen OMEPRAZOLE 40 MG PO CPDR Oral Take 1 capsule (40 mg total) by mouth daily. 30 capsule 3  . PILOCARPINE HCL 5 MG PO TABS Oral Take 1 tablet by mouth Twice daily.    Marland Kitchen VALACYCLOVIR HCL 1 G PO TABS Oral Take 1 tablet (1,000 mg total) by mouth 3 (three) times daily. 21 tablet 0  . VITAMIN D (ERGOCALCIFEROL) 50000 UNITS PO CAPS Oral Take 1 capsule by mouth Once a week.      BP 156/94  Pulse 90  Temp 97 F (36.1 C) (Oral)  Resp 18  SpO2 98%  Physical Exam  Nursing note and vitals reviewed. Constitutional: She is oriented to person, place, and time. She appears well-developed and well-nourished.  HENT:  Head: Normocephalic.  Right Ear: External ear normal.  Left Ear: External ear normal.  Nose: Mucosal edema and rhinorrhea present.  Mouth/Throat: Mucous membranes are normal. Posterior oropharyngeal erythema present.  Eyes: Conjunctivae normal are normal. Pupils are equal, round, and reactive to light.  Neck: Normal range of motion. Neck supple.  Cardiovascular: Normal rate, normal heart sounds and intact distal pulses.   Pulmonary/Chest: Breath sounds normal.  Lymphadenopathy:    She has no cervical adenopathy.  Neurological: She is alert and oriented to person, place, and time.  Skin: Skin is warm and dry.    ED Course  Procedures (including critical care time)  Labs Reviewed - No data to display No results  found.   1. Sinusitis, acute       MDM          Linna Hoff, MD 10/28/11 0900

## 2011-10-28 NOTE — ED Notes (Signed)
Pt c/o poss sinus infection x4 days... Sx include: nasal congestion, nauseas, fever, headache, chest pain when she inhales, green sputum w/blood, hoarseness voice... Denies: diarrhea, vomiting, SOB... Pt is alert w/no signs of distress

## 2011-12-19 ENCOUNTER — Other Ambulatory Visit: Payer: Self-pay | Admitting: Family Medicine

## 2011-12-19 MED ORDER — LISINOPRIL-HYDROCHLOROTHIAZIDE 20-25 MG PO TABS: 1.0000 | ORAL_TABLET | Freq: Every day | ORAL | Status: DC

## 2011-12-19 NOTE — Telephone Encounter (Signed)
refill Lisinopril HCTZ 20-25MG  Tab #30 wt/2-refills last filled 9.22.13, take one tablet by mouth Daily last ov 7.12.13

## 2011-12-19 NOTE — Telephone Encounter (Signed)
Rx sent 

## 2012-01-17 IMAGING — CR DG ABDOMEN ACUTE W/ 1V CHEST
4 series · 4 of 4 positions shown · non-contrast
Comparison: None

CLINICAL DATA: Left upper quadrant pain, diarrhea

ACUTE ABDOMEN SERIES (ABDOMEN 2 VIEW & CHEST 1 VIEW)

[w chest pa]
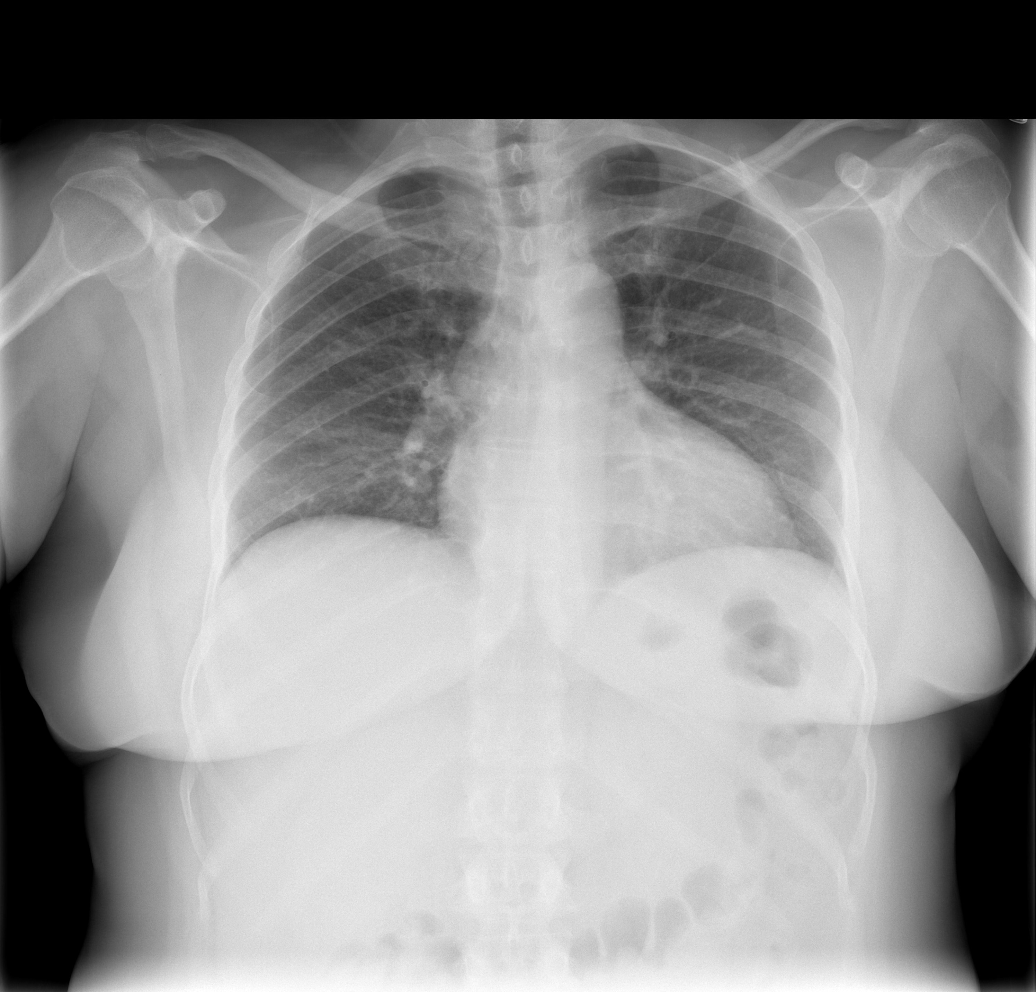

[w abdomen upright *]
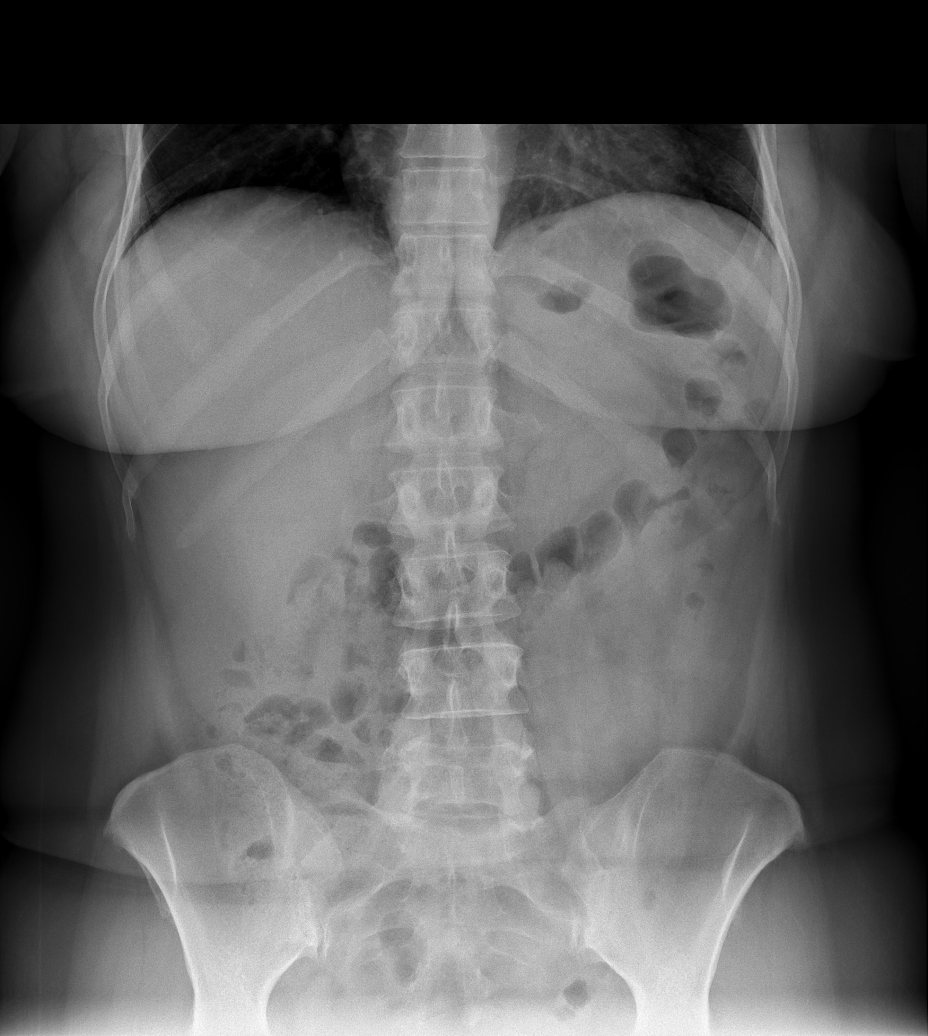

[t abdomen supine (1 of 2)]
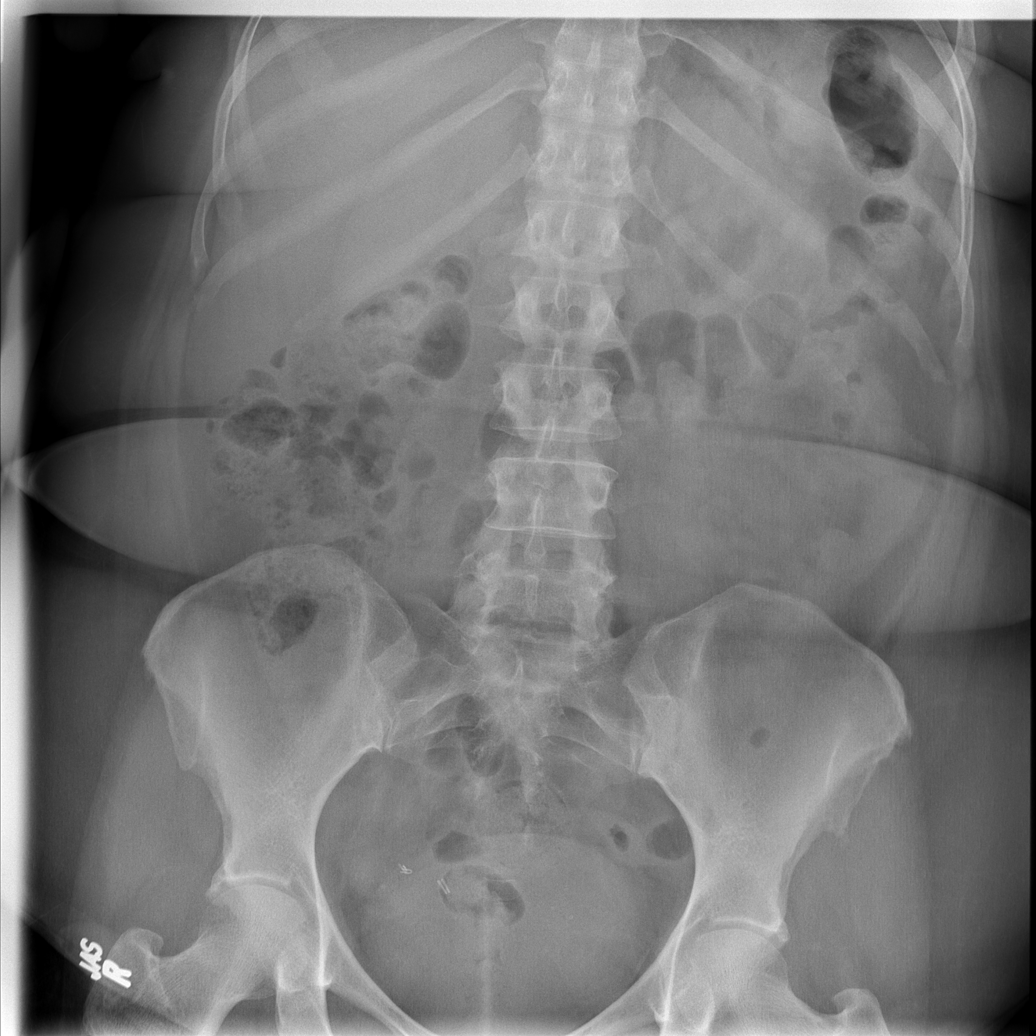

[t abdomen supine (2 of 2)]
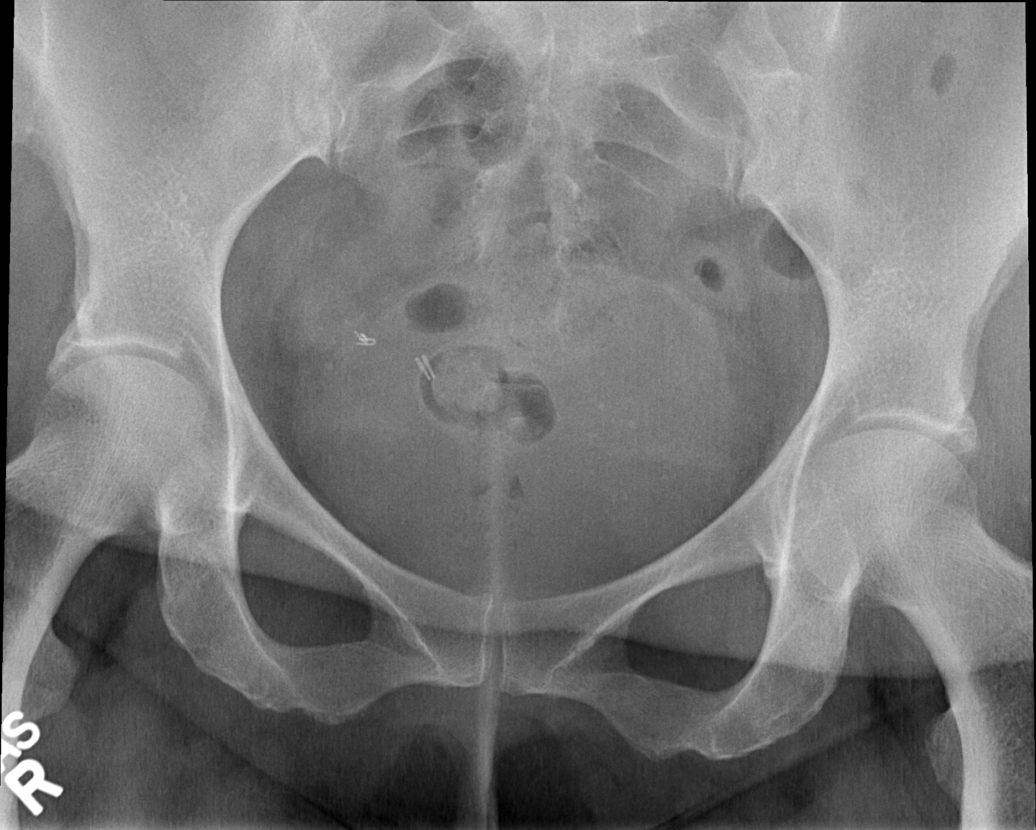

[4 of 4 positions shown; findings below may reference images not displayed]

FINDINGS: Heart size is mildly enlarged.  Negative for heart
failure.  Mild right lower lobe airspace density is probably
atelectasis.

Bowel gas pattern is normal.  Negative for bowel obstruction or
free air.  Surgical clips are present in the right pelvis.  No
renal calculi.  No bony abnormality.
IMPRESSION: Mild right lower lobe atelectasis.

Negative abdomen.

## 2012-01-17 IMAGING — CT CT ABD-PELV W/ CM
2 of 11 series · 12 of 46 positions shown, 19 images · IV contrast (APPLIED)
Comparison: Ultrasound 07/24/2009

CLINICAL DATA: Epigastric pain.

CT ABDOMEN AND PELVIS WITH CONTRAST
TECHNIQUE: Multidetector CT imaging of the abdomen and pelvis was
performed following the standard protocol during bolus
administration of intravenous contrast.
Contrast: 100 ml Omnipaque 300 IV.

[Series 10: abd/pelv with 5.0 b31f st · axial · 0.82mm/px · z∈[-560,-190]mm · 10 of 90 slices shown, 16 images]
[im 8/90  soft-tissue]
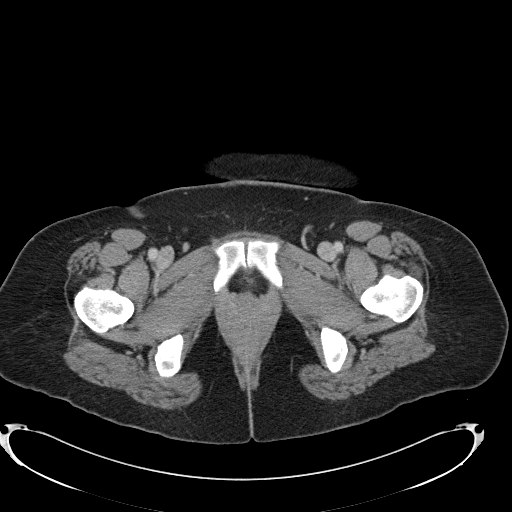
[im 8/90  bone]
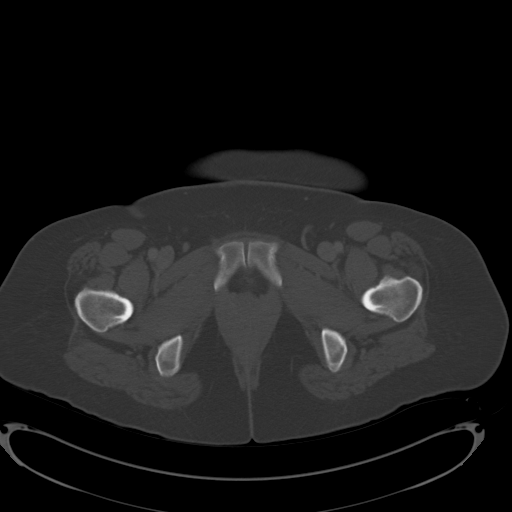
[im 15/90  soft-tissue]
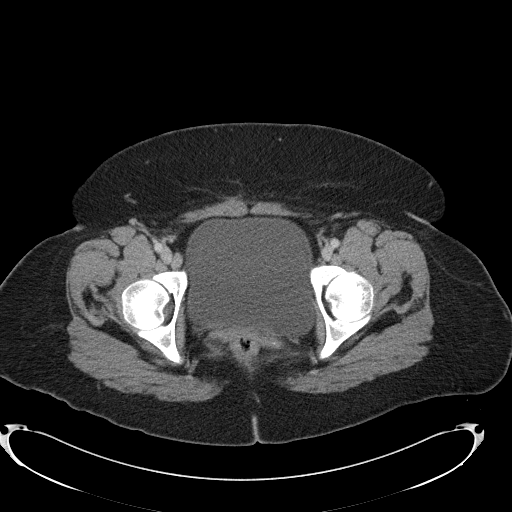
[im 23/90  soft-tissue]
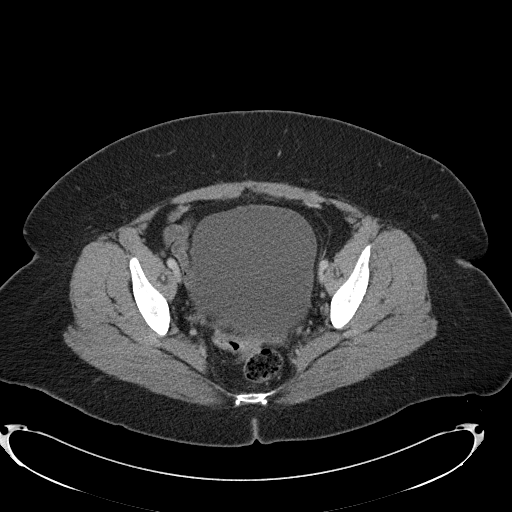
[im 30/90  soft-tissue]
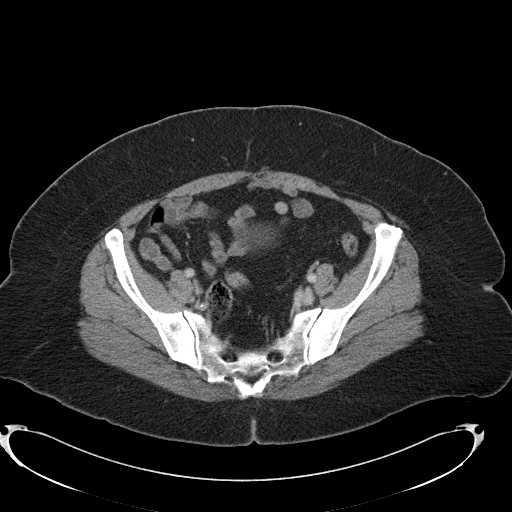
[im 38/90  soft-tissue]
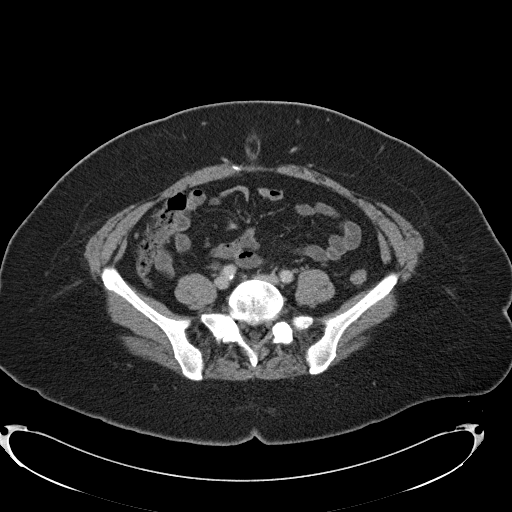
[im 52/90  soft-tissue]
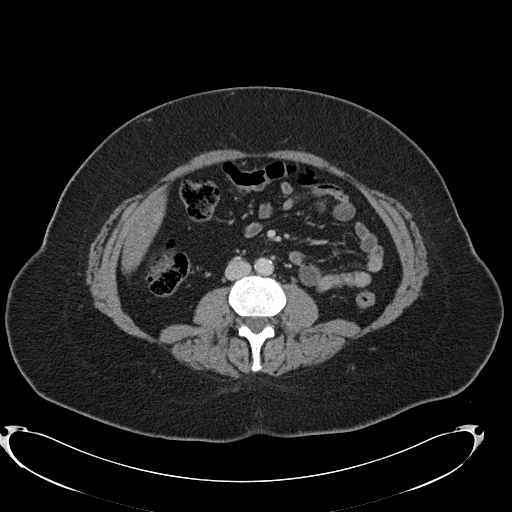
[im 60/90  soft-tissue]
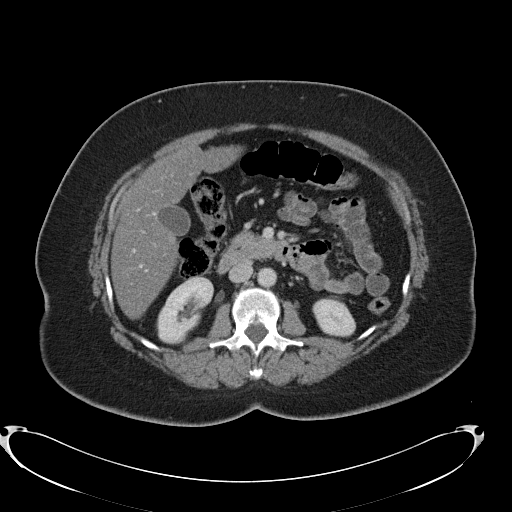
[im 60/90  lung]
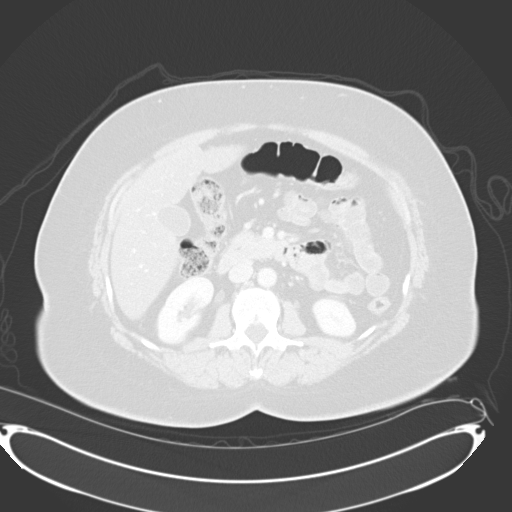
[im 67/90  soft-tissue]
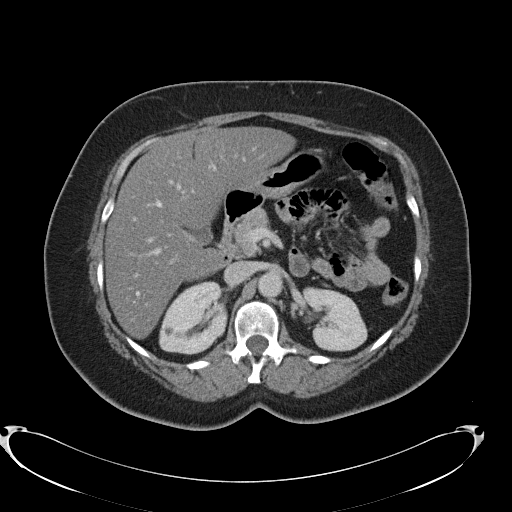
[im 67/90  lung]
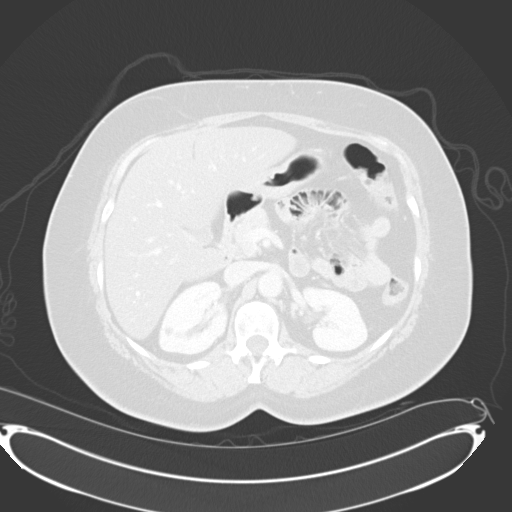
[im 75/90  soft-tissue]
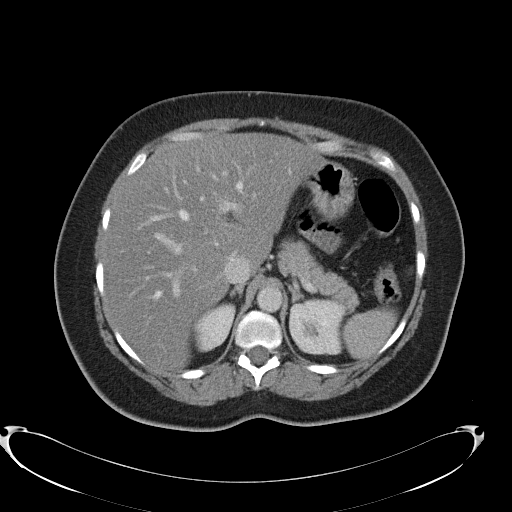
[im 75/90  lung]
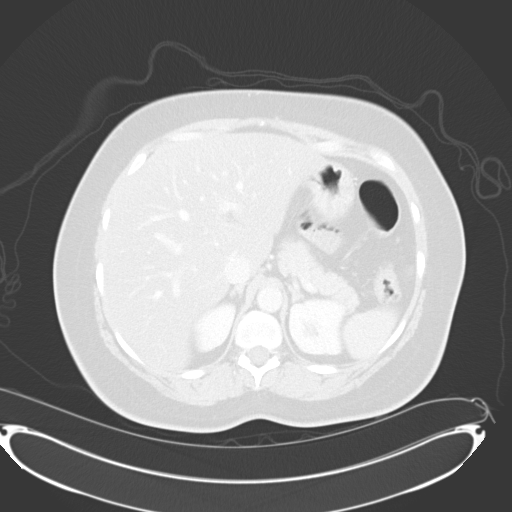
[im 75/90  bone]
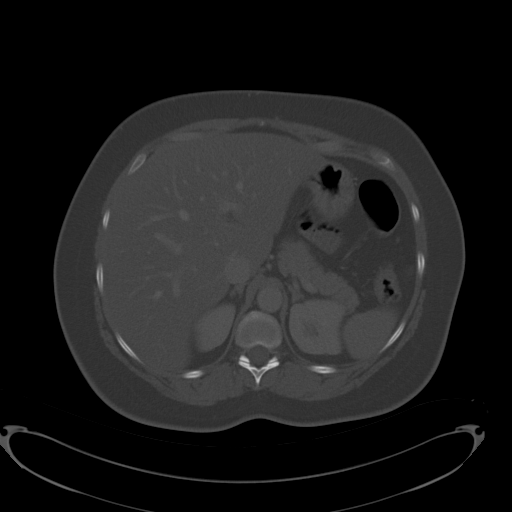
[im 82/90  soft-tissue]
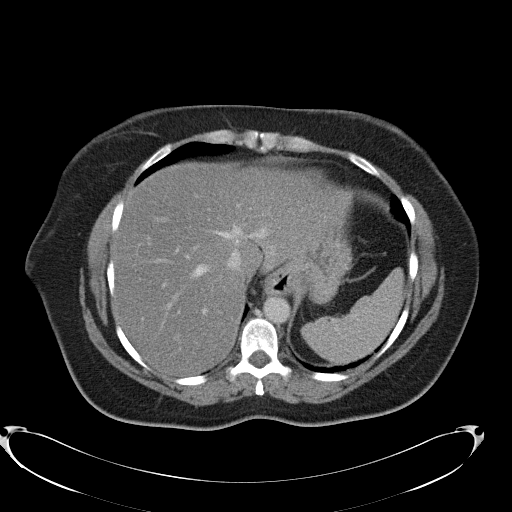
[im 82/90  lung]
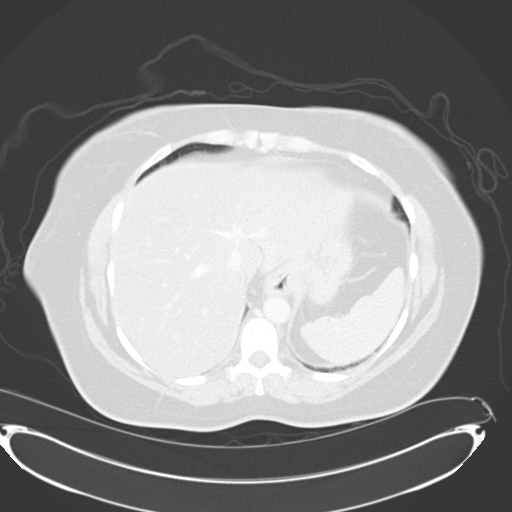

[Series 606: coronal · coronal · 0.88mm/px · 2 of 151 slices shown, 3 images]
[im 51/151  soft-tissue]
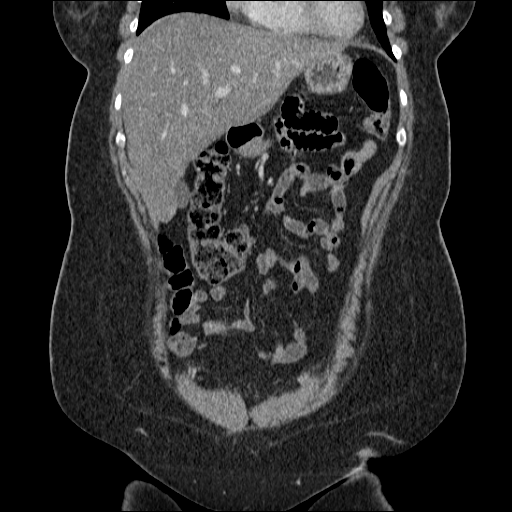
[im 51/151  bone]
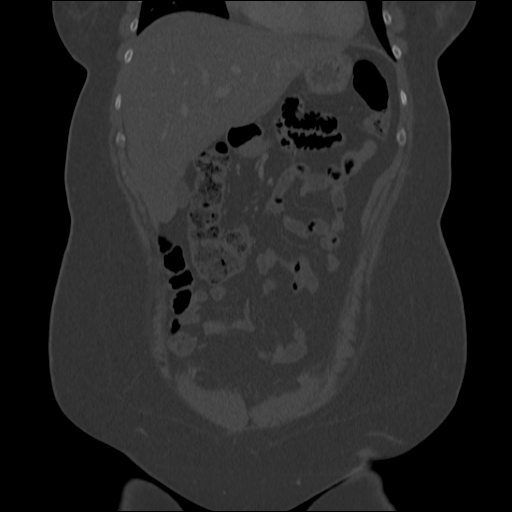
[im 101/151  soft-tissue]
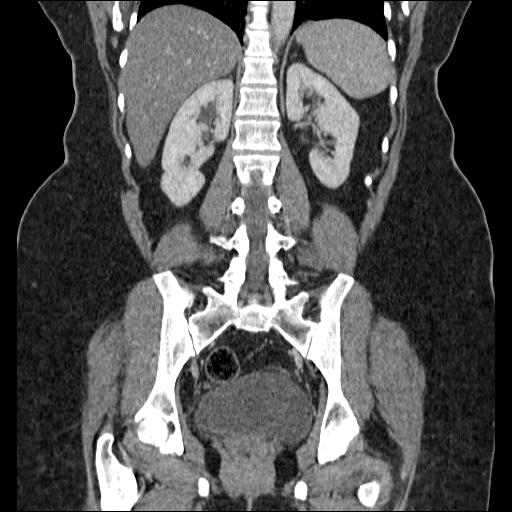

[12 of 46 positions shown; findings below may reference images not displayed]

FINDINGS: There is fatty infiltration of the liver.  Hiatal hernia
present containing a small portion of the stomach and the posterior
aspect of the left hepatic lobe as seen on prior chest CTs.
Gallbladder, spleen, pancreas, adrenals and kidneys have a normal
appearance as does the urinary bladder.  Prior hysterectomy.  No
adnexal masses.  Appendix is visualized and is normal.  There is a
small umbilical hernia containing fat. Bowel grossly unremarkable.
No free fluid, free air, or adenopathy.  Aorta and iliac vessels
are normal caliber.  No acute bony abnormality.
IMPRESSION: Fatty infiltration of the liver.

Hiatal hernia containing small portion of the stomach and posterior
left hepatic lobe.

No acute findings in the abdomen or pelvis.

## 2012-03-03 ENCOUNTER — Other Ambulatory Visit: Payer: Self-pay

## 2012-04-09 ENCOUNTER — Other Ambulatory Visit: Payer: Self-pay

## 2012-04-09 DIAGNOSIS — Z9889 Other specified postprocedural states: Secondary | ICD-10-CM

## 2012-04-09 DIAGNOSIS — Z1231 Encounter for screening mammogram for malignant neoplasm of breast: Secondary | ICD-10-CM

## 2012-05-04 ENCOUNTER — Telehealth: Payer: Self-pay | Admitting: Family Medicine

## 2012-05-07 MED ORDER — LISINOPRIL-HYDROCHLOROTHIAZIDE 20-25 MG PO TABS: 1.0000 | ORAL_TABLET | Freq: Every day | ORAL | Status: DC

## 2012-05-07 NOTE — Telephone Encounter (Signed)
Med filled.  

## 2012-05-14 ENCOUNTER — Ambulatory Visit
Admission: RE | Admit: 2012-05-14 | Discharge: 2012-05-14 | Disposition: A | Discharge status: Home / Self Care | Payer: Managed Care, Other (non HMO) | Source: Ambulatory Visit

## 2012-05-14 DIAGNOSIS — Z1231 Encounter for screening mammogram for malignant neoplasm of breast: Secondary | ICD-10-CM

## 2012-05-14 DIAGNOSIS — Z9889 Other specified postprocedural states: Secondary | ICD-10-CM

## 2012-11-22 ENCOUNTER — Other Ambulatory Visit: Payer: Self-pay

## 2012-12-28 ENCOUNTER — Ambulatory Visit: Payer: Self-pay | Admitting: Internal Medicine

## 2013-05-06 ENCOUNTER — Other Ambulatory Visit: Payer: Self-pay

## 2013-05-06 DIAGNOSIS — Z1231 Encounter for screening mammogram for malignant neoplasm of breast: Secondary | ICD-10-CM

## 2013-05-15 ENCOUNTER — Ambulatory Visit
Admission: RE | Admit: 2013-05-15 | Discharge: 2013-05-15 | Disposition: A | Discharge status: Home / Self Care | Payer: Managed Care, Other (non HMO) | Source: Ambulatory Visit

## 2013-05-15 DIAGNOSIS — Z1231 Encounter for screening mammogram for malignant neoplasm of breast: Secondary | ICD-10-CM

## 2014-07-15 ENCOUNTER — Other Ambulatory Visit: Payer: Self-pay | Admitting: Internal Medicine

## 2014-07-15 DIAGNOSIS — G44311 Acute post-traumatic headache, intractable: Secondary | ICD-10-CM

## 2014-07-16 ENCOUNTER — Other Ambulatory Visit: Payer: Self-pay | Admitting: Internal Medicine

## 2014-07-16 ENCOUNTER — Ambulatory Visit
Admission: RE | Admit: 2014-07-16 | Discharge: 2014-07-16 | Disposition: A | Discharge status: Home / Self Care | Payer: Managed Care, Other (non HMO) | Source: Ambulatory Visit | Attending: Internal Medicine | Admitting: Internal Medicine

## 2014-07-16 DIAGNOSIS — R51 Headache: Secondary | ICD-10-CM | POA: Insufficient documentation

## 2014-07-16 DIAGNOSIS — M25552 Pain in left hip: Secondary | ICD-10-CM | POA: Insufficient documentation

## 2014-07-16 DIAGNOSIS — G44311 Acute post-traumatic headache, intractable: Secondary | ICD-10-CM

## 2014-07-17 ENCOUNTER — Ambulatory Visit
Admission: RE | Admit: 2014-07-17 | Discharge: 2014-07-17 | Disposition: A | Discharge status: Home / Self Care | Payer: Managed Care, Other (non HMO) | Source: Ambulatory Visit | Attending: Internal Medicine | Admitting: Internal Medicine

## 2014-07-18 ENCOUNTER — Ambulatory Visit: Payer: Managed Care, Other (non HMO)

## 2014-07-22 ENCOUNTER — Other Ambulatory Visit: Payer: Self-pay | Admitting: Internal Medicine

## 2014-07-22 DIAGNOSIS — M25552 Pain in left hip: Secondary | ICD-10-CM

## 2015-02-26 ENCOUNTER — Ambulatory Visit (INDEPENDENT_AMBULATORY_CARE_PROVIDER_SITE_OTHER): Payer: Worker's Compensation | Admitting: Family Medicine

## 2015-02-26 ENCOUNTER — Ambulatory Visit: Payer: Worker's Compensation

## 2015-02-26 VITALS — BP 152/84 | HR 92 | Temp 98.6°F | Resp 20 | Ht 65.0 in | Wt 178.6 lb

## 2015-02-26 DIAGNOSIS — M545 Low back pain, unspecified: Secondary | ICD-10-CM

## 2015-02-26 DIAGNOSIS — S39012A Strain of muscle, fascia and tendon of lower back, initial encounter: Secondary | ICD-10-CM | POA: Diagnosis not present

## 2015-02-26 DIAGNOSIS — M25551 Pain in right hip: Secondary | ICD-10-CM

## 2015-02-26 DIAGNOSIS — M6248 Contracture of muscle, other site: Secondary | ICD-10-CM

## 2015-02-26 DIAGNOSIS — M62838 Other muscle spasm: Secondary | ICD-10-CM

## 2015-02-26 DIAGNOSIS — S8011XA Contusion of right lower leg, initial encounter: Secondary | ICD-10-CM

## 2015-02-26 NOTE — Patient Instructions (Addendum)
Because you received an x-ray today, you will receive an invoice from [facility]. Please contact [facility] at [phone] with questions or concerns regarding your invoice. Our billing staff will not be able to assist you with those questions.   In reviewing your allergies, it would be safest to avoid muscle relaxants at this time. Tylenol over-the-counter as needed, heat or ice to affected areas, and gentle range of motion of your back, hip, and neck. Follow-up with me in the approximate 5 days, sooner if worse. If you are having more difficulty with weightbearing, follow up sooner here or the emergency room.  Return to the clinic or go to the nearest emergency room if any of your symptoms worsen or new symptoms occur.    Contusion A contusion is a deep bruise. Contusions are the result of a blunt injury to tissues and muscle fibers under the skin. The injury causes bleeding under the skin. The skin overlying the contusion may turn blue, purple, or yellow. Minor injuries will give you a painless contusion, but more severe contusions may stay painful and swollen for a few weeks.  CAUSES  This condition is usually caused by a blow, trauma, or direct force to an area of the body. SYMPTOMS  Symptoms of this condition include:  Swelling of the injured area.  Pain and tenderness in the injured area.  Discoloration. The area may have redness and then turn blue, purple, or yellow. DIAGNOSIS  This condition is diagnosed based on a physical exam and medical history. An X-ray, CT scan, or MRI may be needed to determine if there are any associated injuries, such as broken bones (fractures). TREATMENT  Specific treatment for this condition depends on what area of the body was injured. In general, the best treatment for a contusion is resting, icing, applying pressure to (compression), and elevating the injured area. This is often called the RICE strategy. Over-the-counter  anti-inflammatory medicines may also be recommended for pain control.  HOME CARE INSTRUCTIONS   Rest the injured area.  If directed, apply ice to the injured area:  Put ice in a plastic bag.  Place a towel between your skin and the bag.  Leave the ice on for 20 minutes, 2-3 times per day.  If directed, apply light compression to the injured area using an elastic bandage. Make sure the bandage is not wrapped too tightly. Remove and reapply the bandage as directed by your health care provider.  If possible, raise (elevate) the injured area above the level of your heart while you are sitting or lying down.  Take over-the-counter and prescription medicines only as told by your health care provider. SEEK MEDICAL CARE IF:  Your symptoms do not improve after several days of treatment.  Your symptoms get worse.  You have difficulty moving the injured area. SEEK IMMEDIATE MEDICAL CARE IF:   You have severe pain.  You have numbness in a hand or foot.  Your hand or foot turns pale or cold.   This information is not intended to replace advice given to you by your health care provider. Make sure you discuss any questions you have with your health care provider.   Document Released: 10/13/2004 Document Revised: 09/24/2014 Document Reviewed: 05/21/2014 Elsevier Interactive Patient Education 2016 [employer]   Back Pain, Adult Back pain is very common in adults.The cause of back pain is rarely dangerous and the pain often gets better over time.The cause of your back pain may not be known. Some common  causes of back pain include:  Strain of the muscles or ligaments supporting the spine.  Wear and tear (degeneration) of the spinal disks.  Arthritis.  Direct injury to the back. For many people, back pain may return. Since back pain is rarely dangerous, most people can learn to manage this condition on their own. HOME CARE INSTRUCTIONS Watch your back pain for any changes. The  following actions may help to lessen any discomfort you are feeling:  Remain active. It is stressful on your back to sit or stand in one place for long periods of time. Do not sit, drive, or stand in one place for more than 30 minutes at a time. Take short walks on even surfaces as soon as you are able.Try to increase the length of time you walk each day.  Exercise regularly as directed by your health care provider. Exercise helps your back heal faster. It also helps avoid future injury by keeping your muscles strong and flexible.  Do not stay in bed.Resting more than 1-2 days can delay your recovery.  Pay attention to your body when you bend and lift. The most comfortable positions are those that put less stress on your recovering back. Always use proper lifting techniques, including:  Bending your knees.  Keeping the load close to your body.  Avoiding twisting.  Find a comfortable position to sleep. Use a firm mattress and lie on your side with your knees slightly bent. If you lie on your back, put a pillow under your knees.  Avoid feeling anxious or stressed.Stress increases muscle tension and can worsen back pain.It is important to recognize when you are anxious or stressed and learn ways to manage it, such as with exercise.  Take medicines only as directed by your health care provider. Over-the-counter medicines to reduce pain and inflammation are often the most helpful.Your health care provider may prescribe muscle relaxant drugs.These medicines help dull your pain so you can more quickly return to your normal activities and healthy exercise.  Apply ice to the injured area:  Put ice in a plastic bag.  Place a towel between your skin and the bag.  Leave the ice on for 20 minutes, 2-3 times a day for the first 2-3 days. After that, ice and heat may be alternated to reduce pain and spasms.  Maintain a healthy weight. Excess weight puts extra stress on your back and makes it  difficult to maintain good posture. SEEK MEDICAL CARE IF:  You have pain that is not relieved with rest or medicine.  You have increasing pain going down into the legs or buttocks.  You have pain that does not improve in one week.  You have night pain.  You lose weight.  You have a fever or chills. SEEK IMMEDIATE MEDICAL CARE IF:   You develop new bowel or bladder control problems.  You have unusual weakness or numbness in your arms or legs.  You develop nausea or vomiting.  You develop abdominal pain.  You feel faint.   This information is not intended to replace advice given to you by your health care provider. Make sure you discuss any questions you have with your health care provider.   Document Released: 01/03/2005 Document Revised: 01/24/2014 Document Reviewed: 05/07/2013 Elsevier Interactive Patient Education [employer].

## 2015-02-26 NOTE — Progress Notes (Signed)
Subjective:  By signing my name below, I, Moises Blood, attest that this documentation has been prepared under the direction and in the presence of Merri Ray, MD. Electronically Signed: Moises Blood, [place]. 02/26/2015 , 5:25 PM .  Patient was seen in Room 3 .   Patient ID: [name], female    DOB: [birth year], 60 y.o.   MRN: [id number] Chief Complaint  Patient presents with  . Other    right side hurt, fell last night   HPI [name] is a 60 y.o. female Here for injury while at work last night when she tripped on computer wire at around 6:45PM. She fell on her right side including right sided neck pain, right shoulder pain, right sided lower back pain, right hip pain, right leg pain, right ankle discomfort and right toe discomfort. She had to sit down after getting up, gather herself together and was able to get up and walk afterwards. She took 4 ibuprofen and 2 tablets this morning with temporary relief. She was able to ambulate but just feels really tender on the outside of her right side. She notes having some pain when putting weight on her right leg. She also feels some discomfort in her abdomen. She denies any GU symptoms, vomiting, nausea or diarrhea. She denies any pain radiating towards groin or genitalia area.   She works in [employer].  She has h/o Sjogren's syndrome and rheumatoid arthritis.   She sees rheumatologist at [facility] or [facility].  She's taken muscle relaxants in the past. She's used epsom salt bathes for relief.   Allergies  Allergen Reactions  . Amitriptyline     Leg swelling   . Latex    Prior to Admission medications   Medication Sig Start Date End Date Taking? Authorizing Provider  amitriptyline (ELAVIL) 25 MG tablet  03/30/11  Yes Historical Provider, MD  fluticasone (FLONASE) 50 MCG/ACT nasal spray 1 spray by Nasal route 2 (two) times daily.  05/14/10  Yes Historical Provider, MD  hydroxychloroquine (PLAQUENIL) 200 MG tablet  Take 400 mg by mouth daily.     Yes Historical Provider, MD  lisinopril-hydrochlorothiazide (PRINZIDE,ZESTORETIC) 20-25 MG per tablet Take 1 tablet by mouth daily. 05/07/12  Yes Midge Minium, MD  mirtazapine (REMERON) 15 MG tablet Take 30 mg by mouth at bedtime.    Yes Historical Provider, MD  Multiple Vitamin (MULTIVITAMIN) tablet Take 1 tablet by mouth daily.     Yes Historical Provider, MD  buPROPion (WELLBUTRIN XL) 150 MG 24 hr tablet Take 1 tablet (150 mg total) by mouth daily. 07/29/11 07/28/12  Midge Minium, MD  omeprazole (PRILOSEC) 40 MG capsule Take 1 capsule (40 mg total) by mouth daily. 06/23/11 06/22/12  Midge Minium, MD  pilocarpine (SALAGEN) 5 MG tablet Take 1 tablet by mouth Twice daily. Reported on 02/26/2015 05/19/11   Historical Provider, MD  Vitamin D, Ergocalciferol, (DRISDOL) 50000 UNITS CAPS Take 1 capsule by mouth Once a week. Reported on 02/26/2015 05/18/11   Historical Provider, MD   Review of Systems  Gastrointestinal: Negative for nausea, vomiting and diarrhea.  Genitourinary: Negative for dysuria, urgency and hematuria.  Musculoskeletal: Positive for myalgias, back pain, arthralgias and neck pain. Negative for joint swelling, gait problem and neck stiffness.  Skin: Negative for rash and wound.       Objective:   Physical Exam  Constitutional: She is oriented to person, place, and time. She appears well-developed and well-nourished. No distress.  HENT:  Head:  Normocephalic and atraumatic.  Eyes: EOM are normal. Pupils are equal, round, and reactive to light.  Neck: Neck supple.  Cardiovascular: Normal rate.   Pulses:      Dorsalis pedis pulses are 2+ on the right side, and 2+ on the left side.  Pulmonary/Chest: Effort normal. No respiratory distress.  Musculoskeletal: Normal range of motion.  Right ankle: no ankle tenderness, no 5th or navicular tenderness, no bony tenderness, NVI distally at the toes, normal rom, normal strength of toes  L-spine: some  tenderness along mid aspect into right paraspinals, also tenderness diffusely from right buttock to sciatic notch, tender along right lateral thigh, appear to spare trochanteric bursa, negative seated straight leg raise  Right lateral thigh: Skin intact, lateral thigh pain with external rotation to hip, no pain with internal rotation to groin, strength intact at right lower extremity  Right shoulder: no focal tenderness including clavicle [place] or AC joint, full ROM, full rotator cuff strength  Neck: some soreness in trapezius and upper paraspinals of thoracic spine and with neck extension, but no focal tenderness of t-spine. No midline bony tenderness along t-spine  Neurological: She is alert and oriented to person, place, and time. She displays no Babinski's sign on the right side.  Reflex Scores:      Patellar reflexes are 2+ on the right side.      Achilles reflexes are 2+ on the right side. Skin: Skin is warm and dry.  Psychiatric: She has a normal mood and affect. Her behavior is normal.  Nursing note and vitals reviewed.   Filed Vitals:   02/26/15 1551 02/26/15 1623  BP: 162/84 152/84  Pulse: 92   Temp: 98.6 F (37 C)   TempSrc: Oral   Resp: 20   Height: 5\' 5"  (1.651 m)   Weight: 178 lb 9.6 oz (81.012 kg)   SpO2: 95%    Dg Cervical Spine 2 Or 3 Views  02/26/2015  CLINICAL DATA:  Here for injury while at work last night when she tripped on computer wire at around 6:45PM. She fell on her right side including right sided neck pain, right shoulder pain, right sided lower back pain, right hip pain, EXAM: CERVICAL SPINE - 2-3 VIEW COMPARISON:  None. FINDINGS: No fracture.  No spondylolisthesis. Mild loss of disc height at C6-C7 with small endplate osteophytes. No other degenerative change. Normal soft tissues. IMPRESSION: No fracture or acute finding. Electronically Signed   By: Lajean Manes M.D.   On: 02/26/2015 18:08   Dg Lumbar Spine 2-3 Views  02/26/2015  CLINICAL DATA:  Low back  pain status post fall last night. EXAM: LUMBAR SPINE - 2-3 VIEW COMPARISON:  None. FINDINGS: There is no evidence of lumbar spine fracture. Alignment is normal. Intervertebral disc spaces are maintained. IMPRESSION: No acute fracture or dislocation. Electronically Signed   By: Abelardo Diesel M.D.   On: 02/26/2015 18:08   Dg Hip Unilat W Or W/o Pelvis 2-3 Views Right  02/26/2015  CLINICAL DATA:  Fall last night.  Right hip pain. EXAM: DG HIP (WITH OR WITHOUT PELVIS) 2-3V RIGHT COMPARISON:  None. FINDINGS: There is no evidence of hip fracture or dislocation. There is no evidence of arthropathy or other focal bone abnormality. IMPRESSION: Negative. Electronically Signed   By: Lajean Manes M.D.   On: 02/26/2015 18:08      Assessment & Plan:  [name] is a 60 y.o. female Muscle spasms of neck - Plan: DG Cervical Spine 2 or 3 views  Contusion of leg, right, initial encounter  Right hip pain - Plan: DG HIP UNILAT W OR W/O PELVIS 2-3 VIEWS RIGHT  Right-sided low back pain without sciatica - Plan: DG Lumbar Spine 2-3 Views  Low back strain, initial encounter  Low back strain, hip/leg contusion and upper back/neck spasm due to fall at work.  initial c/o of foot discomfort, but nontender on exam without apparent foot injury at present. Reassuring xrays and exam.   -tylenol otc, heat or ice and range of motion as tolerated.   -avoided mm relaxants at present based on allergies.   -recheck in 5 days, work restrictions per letter.   - if any increased difficulty with weightbearing on hip -- rtc sooner.   No orders of the defined types were placed in this encounter.   Patient Instructions   Because you received an x-ray today, you will receive an invoice from [facility]. Please contact [facility] at [phone] with questions or concerns regarding your invoice. Our billing staff will not be able to assist you with those questions.   In reviewing your allergies, it would be  safest to avoid muscle relaxants at this time. Tylenol over-the-counter as needed, heat or ice to affected areas, and gentle range of motion of your back, hip, and neck. Follow-up with me in the approximate 5 days, sooner if worse. If you are having more difficulty with weightbearing, follow up sooner here or the emergency room.  Return to the clinic or go to the nearest emergency room if any of your symptoms worsen or new symptoms occur.    Contusion A contusion is a deep bruise. Contusions are the result of a blunt injury to tissues and muscle fibers under the skin. The injury causes bleeding under the skin. The skin overlying the contusion may turn blue, purple, or yellow. Minor injuries will give you a painless contusion, but more severe contusions may stay painful and swollen for a few weeks.  CAUSES  This condition is usually caused by a blow, trauma, or direct force to an area of the body. SYMPTOMS  Symptoms of this condition include:  Swelling of the injured area.  Pain and tenderness in the injured area.  Discoloration. The area may have redness and then turn blue, purple, or yellow. DIAGNOSIS  This condition is diagnosed based on a physical exam and medical history. An X-ray, CT scan, or MRI may be needed to determine if there are any associated injuries, such as broken bones (fractures). TREATMENT  Specific treatment for this condition depends on what area of the body was injured. In general, the best treatment for a contusion is resting, icing, applying pressure to (compression), and elevating the injured area. This is often called the RICE strategy. Over-the-counter anti-inflammatory medicines may also be recommended for pain control.  HOME CARE INSTRUCTIONS   Rest the injured area.  If directed, apply ice to the injured area:  Put ice in a plastic bag.  Place a towel between your skin and the bag.  Leave the ice on for 20 minutes, 2-3 times per day.  If directed, apply  light compression to the injured area using an elastic bandage. Make sure the bandage is not wrapped too tightly. Remove and reapply the bandage as directed by your health care provider.  If possible, raise (elevate) the injured area above the level of your heart while you are sitting or lying down.  Take over-the-counter and prescription medicines only as told by your health care provider.  SEEK MEDICAL CARE IF:  Your symptoms do not improve after several days of treatment.  Your symptoms get worse.  You have difficulty moving the injured area. SEEK IMMEDIATE MEDICAL CARE IF:   You have severe pain.  You have numbness in a hand or foot.  Your hand or foot turns pale or cold.   This information is not intended to replace advice given to you by your health care provider. Make sure you discuss any questions you have with your health care provider.   Document Released: 10/13/2004 Document Revised: 09/24/2014 Document Reviewed: 05/21/2014 Elsevier Interactive Patient Education 2016 [employer]   Back Pain, Adult Back pain is very common in adults.The cause of back pain is rarely dangerous and the pain often gets better over time.The cause of your back pain may not be known. Some common causes of back pain include:  Strain of the muscles or ligaments supporting the spine.  Wear and tear (degeneration) of the spinal disks.  Arthritis.  Direct injury to the back. For many people, back pain may return. Since back pain is rarely dangerous, most people can learn to manage this condition on their own. HOME CARE INSTRUCTIONS Watch your back pain for any changes. The following actions may help to lessen any discomfort you are feeling:  Remain active. It is stressful on your back to sit or stand in one place for long periods of time. Do not sit, drive, or stand in one place for more than 30 minutes at a time. Take short walks on even surfaces as soon as you are able.Try to increase the  length of time you walk each day.  Exercise regularly as directed by your health care provider. Exercise helps your back heal faster. It also helps avoid future injury by keeping your muscles strong and flexible.  Do not stay in bed.Resting more than 1-2 days can delay your recovery.  Pay attention to your body when you bend and lift. The most comfortable positions are those that put less stress on your recovering back. Always use proper lifting techniques, including:  Bending your knees.  Keeping the load close to your body.  Avoiding twisting.  Find a comfortable position to sleep. Use a firm mattress and lie on your side with your knees slightly bent. If you lie on your back, put a pillow under your knees.  Avoid feeling anxious or stressed.Stress increases muscle tension and can worsen back pain.It is important to recognize when you are anxious or stressed and learn ways to manage it, such as with exercise.  Take medicines only as directed by your health care provider. Over-the-counter medicines to reduce pain and inflammation are often the most helpful.Your health care provider may prescribe muscle relaxant drugs.These medicines help dull your pain so you can more quickly return to your normal activities and healthy exercise.  Apply ice to the injured area:  Put ice in a plastic bag.  Place a towel between your skin and the bag.  Leave the ice on for 20 minutes, 2-3 times a day for the first 2-3 days. After that, ice and heat may be alternated to reduce pain and spasms.  Maintain a healthy weight. Excess weight puts extra stress on your back and makes it difficult to maintain good posture. SEEK MEDICAL CARE IF:  You have pain that is not relieved with rest or medicine.  You have increasing pain going down into the legs or buttocks.  You have pain that does not improve in  one week.  You have night pain.  You lose weight.  You have a fever or chills. SEEK IMMEDIATE  MEDICAL CARE IF:   You develop new bowel or bladder control problems.  You have unusual weakness or numbness in your arms or legs.  You develop nausea or vomiting.  You develop abdominal pain.  You feel faint.   This information is not intended to replace advice given to you by your health care provider. Make sure you discuss any questions you have with your health care provider.   Document Released: 01/03/2005 Document Revised: 01/24/2014 Document Reviewed: 05/07/2013 Elsevier Interactive Patient Education [employer].     I personally performed the services described in this documentation, which was scribed in my presence. The recorded information has been reviewed and considered, and addended by me as needed.

## 2016-02-16 ENCOUNTER — Emergency Department (HOSPITAL_COMMUNITY): Payer: Worker's Compensation

## 2016-02-16 ENCOUNTER — Emergency Department (HOSPITAL_COMMUNITY)
Admission: EM | Admit: 2016-02-16 | Discharge: 2016-02-16 | Disposition: A | Discharge status: Home / Self Care | Payer: Worker's Compensation | Attending: Emergency Medicine | Admitting: Emergency Medicine

## 2016-02-16 ENCOUNTER — Encounter (HOSPITAL_COMMUNITY): Payer: Self-pay | Admitting: *Deleted

## 2016-02-16 DIAGNOSIS — Y9301 Activity, walking, marching and hiking: Secondary | ICD-10-CM | POA: Diagnosis not present

## 2016-02-16 DIAGNOSIS — W19XXXA Unspecified fall, initial encounter: Secondary | ICD-10-CM

## 2016-02-16 DIAGNOSIS — I10 Essential (primary) hypertension: Secondary | ICD-10-CM | POA: Insufficient documentation

## 2016-02-16 DIAGNOSIS — Y929 Unspecified place or not applicable: Secondary | ICD-10-CM | POA: Diagnosis not present

## 2016-02-16 DIAGNOSIS — W01198A Fall on same level from slipping, tripping and stumbling with subsequent striking against other object, initial encounter: Secondary | ICD-10-CM | POA: Insufficient documentation

## 2016-02-16 DIAGNOSIS — S3992XA Unspecified injury of lower back, initial encounter: Secondary | ICD-10-CM | POA: Diagnosis present

## 2016-02-16 DIAGNOSIS — S0003XA Contusion of scalp, initial encounter: Secondary | ICD-10-CM | POA: Diagnosis not present

## 2016-02-16 DIAGNOSIS — R52 Pain, unspecified: Secondary | ICD-10-CM

## 2016-02-16 DIAGNOSIS — R930 Abnormal findings on diagnostic imaging of skull and head, not elsewhere classified: Secondary | ICD-10-CM | POA: Insufficient documentation

## 2016-02-16 DIAGNOSIS — Y999 Unspecified external cause status: Secondary | ICD-10-CM | POA: Diagnosis not present

## 2016-02-16 DIAGNOSIS — Z91040 Latex allergy status: Secondary | ICD-10-CM | POA: Insufficient documentation

## 2016-02-16 DIAGNOSIS — S300XXA Contusion of lower back and pelvis, initial encounter: Secondary | ICD-10-CM | POA: Diagnosis not present

## 2016-02-16 MED ORDER — HYDROCHLOROTHIAZIDE 25 MG PO TABS
25.0000 mg | ORAL_TABLET | Freq: Every day | ORAL | Status: DC
  Administered 2016-02-16: 25 mg via ORAL
  Filled 2016-02-16: qty 1

## 2016-02-16 MED ORDER — CYCLOBENZAPRINE HCL 10 MG PO TABS: 10.0000 mg | ORAL_TABLET | Freq: Two times a day (BID) | ORAL | 0 refills | Status: DC | PRN

## 2016-02-16 MED ORDER — LISINOPRIL 20 MG PO TABS
20.0000 mg | ORAL_TABLET | Freq: Once | ORAL | Status: AC
  Administered 2016-02-16: 20 mg via ORAL
  Filled 2016-02-16: qty 1

## 2016-02-16 MED ORDER — CYCLOBENZAPRINE HCL 10 MG PO TABS
10.0000 mg | ORAL_TABLET | Freq: Once | ORAL | Status: AC
  Administered 2016-02-16: 10 mg via ORAL
  Filled 2016-02-16: qty 1

## 2016-02-16 MED ORDER — LISINOPRIL-HYDROCHLOROTHIAZIDE 20-25 MG PO TABS: 1.0000 | ORAL_TABLET | Freq: Every day | ORAL | 1 refills | Status: DC

## 2016-02-16 MED ORDER — NAPROXEN 500 MG PO TABS: 500.0000 mg | ORAL_TABLET | Freq: Two times a day (BID) | ORAL | 0 refills | Status: DC

## 2016-02-16 MED ORDER — IBUPROFEN 200 MG PO TABS
400.0000 mg | ORAL_TABLET | Freq: Once | ORAL | Status: AC
  Administered 2016-02-16: 400 mg via ORAL
  Filled 2016-02-16: qty 2

## 2016-02-16 NOTE — ED Notes (Signed)
Pt ambulated over 50 feet.  Pt c/o head and neck pain, lower back pain, and some minor left hip pain.   Pt was able to ambulated without assit.  Pt maintained a steady gait.

## 2016-02-16 NOTE — ED Triage Notes (Signed)
Per EMS pt was going to work, got out of her car and slipped on black ice hitting her back, left hip and back of head. Pt denies LOC, not on blood thinners. Pt c/o left hip pain, thoracic and lumbar back pain, headache.

## 2016-02-16 NOTE — ED Provider Notes (Signed)
[facility] DEPT Provider Note   CSN: [id number] Arrival date & time: 02/16/16  [id number]     History   Chief Complaint Chief Complaint  Patient presents with  . Fall    HPI [name] is a 61 y.o. female.  Patient is a 61 year old female with a history of hypertension who was walking in from the parking lot to work today and slipped on black ice hitting her head and back on the asphalt.   The history is provided by the patient.  Fall  This is a new problem. The current episode started 1 to 2 hours ago. The problem occurs constantly. The problem has not changed since onset.Associated symptoms include headaches. Pertinent negatives include no chest pain, no abdominal pain and no shortness of breath. Associated symptoms comments: Neck, mid and lower back pain.  Pain in the left hip.  No able to get up and walk after the fall.  No numbness or weakness in ext.  No LOC but was dazed for a minute.  No nausea/vomiting.. The symptoms are aggravated by bending and twisting. Nothing relieves the symptoms. She has tried nothing for the symptoms. The treatment provided no relief.    Past Medical History:  Diagnosis Date  . Anxiety   . Hiatal hernia   . Hyperlipidemia   . Hypertension   . Rheumatoid arthritis(714.0)   . Sjogren's syndrome [facility])     Patient Active Problem List   Diagnosis Date Noted  . Cough 07/18/2011  . GERD (gastroesophageal reflux disease) 06/07/2011  . Sinusitis acute 06/07/2011  . Epigastric pain 04/13/2011  . Conjunctivitis 04/05/2011  . Costochondritis 03/30/2011  . LYMPHADENOPATHY 03/24/2010  . CARDIAC MURMUR 01/29/2010  . CHEST PAIN UNSPECIFIED 01/29/2010  . SHINGLES 10/05/2009  . DYSPHAGIA 07/27/2009  . FATTY LIVER DISEASE 07/21/2009  . HYPERLIPIDEMIA 07/17/2009  . ANXIETY 07/17/2009  . HYPERTENSION 07/17/2009  . HIATAL HERNIA 07/17/2009  . [place] SYNDROME 07/17/2009  . ARTHRITIS, RHEUMATOID 07/17/2009  . ANOSMIA 07/17/2009  . COMPRESSION  FRACTURE, SPINE 07/17/2009    Past Surgical History:  Procedure Laterality Date  . OOPHORECTOMY     1 ovary remain  . VESICOVAGINAL FISTULA CLOSURE W/ TAH      OB History    No data available       Home Medications    Prior to Admission medications   Medication Sig Start Date End Date Taking? Authorizing Provider  amitriptyline (ELAVIL) 25 MG tablet  03/30/11   Historical Provider, MD  buPROPion (WELLBUTRIN XL) 150 MG 24 hr tablet Take 1 tablet (150 mg total) by mouth daily. 07/29/11 07/28/12  Midge Minium, MD  fluticasone (FLONASE) 50 MCG/ACT nasal spray 1 spray by Nasal route 2 (two) times daily.  05/14/10   Historical Provider, MD  hydroxychloroquine (PLAQUENIL) 200 MG tablet Take 400 mg by mouth daily.      Historical Provider, MD  lisinopril-hydrochlorothiazide (PRINZIDE,ZESTORETIC) 20-25 MG per tablet Take 1 tablet by mouth daily. 05/07/12   Midge Minium, MD  mirtazapine (REMERON) 15 MG tablet Take 30 mg by mouth at bedtime.     Historical Provider, MD  Multiple Vitamin (MULTIVITAMIN) tablet Take 1 tablet by mouth daily.      Historical Provider, MD  omeprazole (PRILOSEC) 40 MG capsule Take 1 capsule (40 mg total) by mouth daily. 06/23/11 06/22/12  Midge Minium, MD  pilocarpine (SALAGEN) 5 MG tablet Take 1 tablet by mouth Twice daily. Reported on 02/26/2015 05/19/11   Historical Provider, MD  Vitamin  D, Ergocalciferol, (DRISDOL) 50000 UNITS CAPS Take 1 capsule by mouth Once a week. Reported on 02/26/2015 05/18/11   Historical Provider, MD    Family History Family History  Problem Relation Age of Onset  . Coronary artery disease Mother   . Hypertension Mother   . Diabetes Mother   . Heart attack Father   . Hypertension Father   . Heart attack Paternal Grandmother   . Stroke Maternal Grandmother   . Breast cancer Maternal Aunt     Social History Social History  Substance Use Topics  . Smoking status: Never Smoker  . Smokeless tobacco: Never Used  . Alcohol use  No     Allergies   Amitriptyline and Latex   Review of Systems Review of Systems  Respiratory: Negative for shortness of breath.   Cardiovascular: Negative for chest pain.  Gastrointestinal: Negative for abdominal pain.  Neurological: Positive for headaches.  All other systems reviewed and are negative.    Physical Exam Updated Vital Signs BP (!) 194/119   Pulse 91   Temp 98.1 F (36.7 C) (Oral)   Resp 18   Ht 5\' 4"  (1.626 m)   Wt 182 lb (82.6 kg)   SpO2 97%   BMI 31.24 kg/m   Physical Exam  Constitutional: She is oriented to person, place, and time. She appears well-developed and well-nourished. No distress.  HENT:  Head: Normocephalic. Head is with contusion.    Mouth/Throat: Oropharynx is clear and moist.  Eyes: Conjunctivae and EOM are normal. Pupils are equal, round, and reactive to light.  Neck: Normal range of motion. Neck supple. Spinous process tenderness and muscular tenderness present. Normal range of motion present.  Cardiovascular: Normal rate, regular rhythm and intact distal pulses.   No murmur heard. Pulmonary/Chest: Effort normal and breath sounds normal. No respiratory distress. She has no wheezes. She has no rales.  Abdominal: Soft. She exhibits no distension. There is no tenderness. There is no rebound and no guarding.  Musculoskeletal: She exhibits tenderness. She exhibits no edema.       Left hip: She exhibits decreased range of motion.       Thoracic back: She exhibits decreased range of motion, tenderness and bony tenderness.       Lumbar back: She exhibits decreased range of motion, tenderness and bony tenderness.       Back:       Legs: Neurological: She is alert and oriented to person, place, and time. She has normal strength. No sensory deficit.  5/5 strength in upper and lower ext  Skin: Skin is warm and dry. No rash noted. No erythema.  Psychiatric: She has a normal mood and affect. Her behavior is normal.  Nursing note and vitals  reviewed.    ED Treatments / Results  Labs (all labs ordered are listed, but only abnormal results are displayed) Labs Reviewed - No data to display  EKG  EKG Interpretation None       Radiology Dg Thoracic Spine W/swimmers  Result Date: 02/16/2016 CLINICAL DATA:  Fall on ice today.  Back pain. EXAM: THORACIC SPINE - 3 VIEWS COMPARISON:  None. FINDINGS: There is no evidence of thoracic spine fracture. Alignment is normal. No other significant bone abnormalities are identified. IMPRESSION: Negative. Electronically Signed   By: Rolm Baptise M.D.   On: 02/16/2016 11:03   Dg Lumbar Spine 2-3 Views  Result Date: 02/16/2016 CLINICAL DATA:  Fall on pints today.  Back pain. EXAM: LUMBAR SPINE - 2-3 VIEW COMPARISON:  None. FINDINGS: There is no evidence of lumbar spine fracture. Alignment is normal. Intervertebral disc spaces are maintained. IMPRESSION: Negative. Electronically Signed   By: Rolm Baptise M.D.   On: 02/16/2016 11:04   Ct Head Wo Contrast  Result Date: 02/16/2016 CLINICAL DATA:  Slipped on ice, hit her back and left hip, hit back of the head EXAM: CT HEAD WITHOUT CONTRAST CT CERVICAL SPINE WITHOUT CONTRAST TECHNIQUE: Multidetector CT imaging of the head and cervical spine was performed following the standard protocol without intravenous contrast. Multiplanar CT image reconstructions of the cervical spine were also generated. COMPARISON:  Brain MRI 07/05/2010 FINDINGS: CT HEAD FINDINGS Brain: No intracranial hemorrhage, mass effect or midline shift. No acute cortical infarction. No hydrocephalus. The gray and white-matter differentiation is preserved. No mass lesion is noted on this unenhanced scan. Vascular: Mild atherosclerotic calcifications of carotid siphon. Skull: No skull fracture is noted. Sinuses/Orbits: Minimal mucosal thickening noted posterior aspect of the right maxillary sinus. The mastoid air cells are unremarkable. Other: None CT CERVICAL SPINE FINDINGS Alignment:  Normal alignment of the cervical spine. Skull base and vertebrae: No acute fracture or subluxation. C1-C2 relationship is unremarkable. Mild anterior and mild posterior spurring at C6-C7 level. Soft tissues and spinal canal: No prevertebral soft tissue swelling. Minimal spinal canal stenosis due to posterior spurring at C6-C7 level. Disc levels: There is mild to moderate disc space flattening at C6-C7 level. Upper chest: There is no pneumothorax in visualized lung apices. Other: Significant left facet cystic degenerative changes with loss of joint space and partial ankylosis at C5-C6 level please see sagittal image 33. IMPRESSION: 1. There is no acute intracranial abnormality. 2. Minimal mucosal thickening posterior aspect of the right maxillary sinus. 3. No cervical spine acute fracture or subluxation. Disc space flattening with mild anterior and mild posterior spurring at C6-C7 level. Significant left facet cystic degenerative changes at C5-C6 level. Electronically Signed   By: Lahoma Crocker M.D.   On: 02/16/2016 11:29   Ct Cervical Spine Wo Contrast  Result Date: 02/16/2016 CLINICAL DATA:  Slipped on ice, hit her back and left hip, hit back of the head EXAM: CT HEAD WITHOUT CONTRAST CT CERVICAL SPINE WITHOUT CONTRAST TECHNIQUE: Multidetector CT imaging of the head and cervical spine was performed following the standard protocol without intravenous contrast. Multiplanar CT image reconstructions of the cervical spine were also generated. COMPARISON:  Brain MRI 07/05/2010 FINDINGS: CT HEAD FINDINGS Brain: No intracranial hemorrhage, mass effect or midline shift. No acute cortical infarction. No hydrocephalus. The gray and white-matter differentiation is preserved. No mass lesion is noted on this unenhanced scan. Vascular: Mild atherosclerotic calcifications of carotid siphon. Skull: No skull fracture is noted. Sinuses/Orbits: Minimal mucosal thickening noted posterior aspect of the right maxillary sinus. The  mastoid air cells are unremarkable. Other: None CT CERVICAL SPINE FINDINGS Alignment: Normal alignment of the cervical spine. Skull base and vertebrae: No acute fracture or subluxation. C1-C2 relationship is unremarkable. Mild anterior and mild posterior spurring at C6-C7 level. Soft tissues and spinal canal: No prevertebral soft tissue swelling. Minimal spinal canal stenosis due to posterior spurring at C6-C7 level. Disc levels: There is mild to moderate disc space flattening at C6-C7 level. Upper chest: There is no pneumothorax in visualized lung apices. Other: Significant left facet cystic degenerative changes with loss of joint space and partial ankylosis at C5-C6 level please see sagittal image 33. IMPRESSION: 1. There is no acute intracranial abnormality. 2. Minimal mucosal thickening posterior aspect of the right maxillary sinus. 3.  No cervical spine acute fracture or subluxation. Disc space flattening with mild anterior and mild posterior spurring at C6-C7 level. Significant left facet cystic degenerative changes at C5-C6 level. Electronically Signed   By: Lahoma Crocker M.D.   On: 02/16/2016 11:29   Dg Hip Unilat With Pelvis 2-3 Views Left  Result Date: 02/16/2016 CLINICAL DATA:  Injury. EXAM: DG HIP (WITH OR WITHOUT PELVIS) 2-3V LEFT COMPARISON:  No recent prior . FINDINGS: Lucency noted about the right greater trochanter. Fracture cannot be excluded. Right hip series should be considered for further evaluation. Left hip is unremarkable. Surgical clips in the pelvis. Calcifications in the pelvis consistent with phleboliths. IMPRESSION: Lucency noted about the right greater trochanter. Fracture cannot be excluded. Right hip series suggested for further evaluation. Left hip is unremarkable. Electronically Signed   By: Marcello Moores  Register   On: 02/16/2016 11:06    Procedures Procedures (including critical care time)  Medications Ordered in ED Medications  hydrochlorothiazide (HYDRODIURIL) tablet 25 mg  (25 mg Oral Given 02/16/16 1127)  lisinopril (PRINIVIL,ZESTRIL) tablet 20 mg (20 mg Oral Given 02/16/16 1127)  ibuprofen (ADVIL,MOTRIN) tablet 400 mg (400 mg Oral Given 02/16/16 1126)  cyclobenzaprine (FLEXERIL) tablet 10 mg (10 mg Oral Given 02/16/16 1127)     Initial Impression / Assessment and Plan / ED Course  I have reviewed the triage vital signs and the nursing notes.  Pertinent labs & imaging results that were available during my care of the patient were reviewed by me and considered in my medical decision making (see chart for details).    Patient is a mechanical fall today at work on the ice. She fell backwards hitting her head and is now having head pain, C, T, L-spine pain and left hip pain. She is neurovascularly intact. No loss of consciousness and she does not take anticoagulation. No chest or abdominal tenderness. Imaging pending. Patient is also hypertensive today but has not been on her hypertension medications for over 2 months. She was given lisinopril and hydrochlorothiazide which she has been on in the past.  12:04 PM Imaging is neg and c-spine cleared.  Pt was able to ambulate without pain in the right hip and has not had pain in the right hip since arrival.  Pain only in left hip.  Low suspicion for fracture and no further imaging necessary.  Will d/c home. Final Clinical Impressions(s) / ED Diagnoses   Final diagnoses:  Pain  Fall, initial encounter  Contusion of lower back, initial encounter  Essential hypertension    New Prescriptions New Prescriptions   CYCLOBENZAPRINE (FLEXERIL) 10 MG TABLET    Take 1 tablet (10 mg total) by mouth 2 (two) times daily as needed for muscle spasms.   LISINOPRIL-HYDROCHLOROTHIAZIDE (PRINZIDE,ZESTORETIC) 20-25 MG TABLET    Take 1 tablet by mouth daily.   NAPROXEN (NAPROSYN) 500 MG TABLET    Take 1 tablet (500 mg total) by mouth 2 (two) times daily.     Blanchie Dessert, MD 02/16/16 1208

## 2016-02-29 ENCOUNTER — Ambulatory Visit (INDEPENDENT_AMBULATORY_CARE_PROVIDER_SITE_OTHER): Payer: Worker's Compensation | Admitting: Orthopaedic Surgery

## 2016-02-29 ENCOUNTER — Encounter (INDEPENDENT_AMBULATORY_CARE_PROVIDER_SITE_OTHER): Payer: Self-pay | Admitting: *Deleted

## 2016-02-29 DIAGNOSIS — M545 Low back pain, unspecified: Secondary | ICD-10-CM | POA: Insufficient documentation

## 2016-02-29 NOTE — Progress Notes (Signed)
Office Visit Note   Patient: [name]           Date of Birth: 1955-10-12           MRN: [id number] Visit Date: 02/29/2016              Requested by: No referring provider defined for this encounter. PCP: No PCP Per Patient   Assessment & Plan: Visit Diagnoses: No diagnosis found.  Plan: no focal findings.  Patient struggles with anxiety and depression.  Recommend PT and modalities.  Reduced work schedule for 4 weeks then recheck in 4 weeks.  Anticipate releasing at that time.  Follow-Up Instructions: Return in about 4 weeks (around 03/28/2016).   Orders:  No orders of the defined types were placed in this encounter.  No orders of the defined types were placed in this encounter.     Procedures: No procedures performed   Clinical Data: No additional findings.   Subjective: Chief Complaint  Patient presents with  . Neck - Pain  . Lower Back - Pain  . Right Hand - Numbness    61 yo anxious female with neck and back pain with radicular pain at times and muscle spasms. Denies any numbness or focal motor deficits.  Denies incontinence.  She fell back in [place] on ice and hit her head and back pretty hard.  CT scans of head, neck, low back were all negative for acute findings.      Review of Systems  Constitutional: Negative.   HENT: Negative.   Eyes: Negative.   Respiratory: Negative.   Cardiovascular: Negative.   Endocrine: Negative.   Musculoskeletal: Negative.   Neurological: Negative.   Hematological: Negative.   Psychiatric/Behavioral: Negative.   All other systems reviewed and are negative.    Objective: Vital Signs: There were no vitals taken for this visit.  Physical Exam  Constitutional: She is oriented to person, place, and time. She appears well-developed and well-nourished.  HENT:  Head: Normocephalic and atraumatic.  Eyes: EOM are normal.  Neck: Neck supple.  Pulmonary/Chest: Effort normal.  Abdominal: Soft.  Neurological: She is  alert and oriented to person, place, and time.  Skin: Skin is warm. Capillary refill takes less than 2 seconds.  Psychiatric: She has a normal mood and affect. Her behavior is normal. Judgment and thought content normal.  Nursing note and vitals reviewed.   Ortho Exam BUE and BLE are normal with motor, sensory, reflexes.  No pathologic reflexes. Negative [place], [place] Specialty Comments:  No specialty comments available.  Imaging: No results found.   PMFS History: Patient Active Problem List   Diagnosis Date Noted  . Cough 07/18/2011  . GERD (gastroesophageal reflux disease) 06/07/2011  . Sinusitis acute 06/07/2011  . Epigastric pain 04/13/2011  . Conjunctivitis 04/05/2011  . Costochondritis 03/30/2011  . LYMPHADENOPATHY 03/24/2010  . CARDIAC MURMUR 01/29/2010  . CHEST PAIN UNSPECIFIED 01/29/2010  . SHINGLES 10/05/2009  . DYSPHAGIA 07/27/2009  . FATTY LIVER DISEASE 07/21/2009  . HYPERLIPIDEMIA 07/17/2009  . ANXIETY 07/17/2009  . HYPERTENSION 07/17/2009  . HIATAL HERNIA 07/17/2009  . [place] SYNDROME 07/17/2009  . ARTHRITIS, RHEUMATOID 07/17/2009  . ANOSMIA 07/17/2009  . COMPRESSION FRACTURE, SPINE 07/17/2009   Past Medical History:  Diagnosis Date  . Anxiety   . Hiatal hernia   . Hyperlipidemia   . Hypertension   . Rheumatoid arthritis(714.0)   . Sjogren's syndrome ([place])     Family History  Problem Relation Age of Onset  . Coronary artery  disease Mother   . Hypertension Mother   . Diabetes Mother   . Heart attack Father   . Hypertension Father   . Heart attack Paternal Grandmother   . Stroke Maternal Grandmother   . Breast cancer Maternal Aunt     Past Surgical History:  Procedure Laterality Date  . OOPHORECTOMY     1 ovary remain  . VESICOVAGINAL FISTULA CLOSURE W/ TAH     Social History   Occupational History  .      [employer]  .  [employer]   Social History Main Topics  . Smoking status: Never Smoker  . Smokeless  tobacco: Never Used  . Alcohol use No  . Drug use: Unknown  . Sexual activity: Not on file

## 2016-03-07 ENCOUNTER — Telehealth (INDEPENDENT_AMBULATORY_CARE_PROVIDER_SITE_OTHER): Payer: Self-pay | Admitting: *Deleted

## 2016-03-10 ENCOUNTER — Telehealth (INDEPENDENT_AMBULATORY_CARE_PROVIDER_SITE_OTHER): Payer: Self-pay | Admitting: Orthopaedic Surgery

## 2016-03-10 NOTE — Telephone Encounter (Signed)
Please advise on note?  

## 2016-03-10 NOTE — Telephone Encounter (Signed)
Note approved. LMOM to let her know its ready for pick up at the front desk.

## 2016-03-10 NOTE — Telephone Encounter (Signed)
Patient called needing an extension on the note for her to be out of work til the 13th. The note she currently has is only til the 12th of March and she has a appointment with Dr. Erlinda Hong March 13th. CB # [phone]

## 2016-03-10 NOTE — Telephone Encounter (Signed)
yes

## 2016-03-11 ENCOUNTER — Telehealth (INDEPENDENT_AMBULATORY_CARE_PROVIDER_SITE_OTHER): Payer: Self-pay | Admitting: Orthopaedic Surgery

## 2016-03-11 NOTE — Telephone Encounter (Signed)
Patient is in need of a new note for work stating that she is on restricted duty until her next appointment March 13th with Dr. Erlinda Hong. She is already working. CB # [phone]

## 2016-03-14 NOTE — Telephone Encounter (Signed)
Please advise 

## 2016-03-14 NOTE — Telephone Encounter (Signed)
Note made.  

## 2016-03-14 NOTE — Telephone Encounter (Signed)
Yes

## 2016-03-15 NOTE — Telephone Encounter (Signed)
LMOM letting her know note is ready for pick up at the front desk.

## 2016-03-22 ENCOUNTER — Ambulatory Visit (INDEPENDENT_AMBULATORY_CARE_PROVIDER_SITE_OTHER): Payer: Worker's Compensation | Admitting: Orthopaedic Surgery

## 2016-03-22 ENCOUNTER — Encounter (INDEPENDENT_AMBULATORY_CARE_PROVIDER_SITE_OTHER): Payer: Self-pay | Admitting: Orthopaedic Surgery

## 2016-03-22 DIAGNOSIS — M545 Low back pain, unspecified: Secondary | ICD-10-CM

## 2016-03-22 DIAGNOSIS — G8929 Other chronic pain: Secondary | ICD-10-CM | POA: Diagnosis not present

## 2016-03-22 DIAGNOSIS — M542 Cervicalgia: Secondary | ICD-10-CM | POA: Insufficient documentation

## 2016-03-22 NOTE — Progress Notes (Signed)
Office Visit Note   Patient: [name]           Date of Birth: Apr 10, 1955           MRN: [id number] Visit Date: 03/22/2016              Requested by: No referring provider defined for this encounter. PCP: No PCP Per Patient   Assessment & Plan: Visit Diagnoses:  1. Cervicalgia   2. Chronic left-sided low back pain without sciatica     Plan: Continue light duty for 12 more weeks. I'll her to do 12 weeks of physical therapy which I'll like to reevaluate her and hopefully get her back to full duty.  Follow-Up Instructions: Return in about 12 weeks (around 06/14/2016).   Orders:  No orders of the defined types were placed in this encounter.  No orders of the defined types were placed in this encounter.     Procedures: No procedures performed   Clinical Data: No additional findings.   Subjective: Chief Complaint  Patient presents with  . Lower Back - Pain  . Neck - Pain    Patient comes back today for follow-up of her neck and back pain. She is doing better with the work duty restrictions and the physical therapy which did just start yesterday. She's been taking ibuprofen Flexeril as needed. She has seen an improvement.    Review of Systems  Constitutional: Negative.   HENT: Negative.   Eyes: Negative.   Respiratory: Negative.   Cardiovascular: Negative.   Endocrine: Negative.   Musculoskeletal: Negative.   Neurological: Negative.   Hematological: Negative.   Psychiatric/Behavioral: Negative.   All other systems reviewed and are negative.    Objective: Vital Signs: There were no vitals taken for this visit.  Physical Exam  Constitutional: She is oriented to person, place, and time. She appears well-developed and well-nourished.  Pulmonary/Chest: Effort normal.  Neurological: She is alert and oriented to person, place, and time.  Skin: Skin is warm. Capillary refill takes less than 2 seconds.  Psychiatric: She has a normal mood and affect. Her  behavior is normal. Judgment and thought content normal.  Nursing note and vitals reviewed.   Ortho Exam Exam is stable and unchanged. No focal findings Specialty Comments:  No specialty comments available.  Imaging: No results found.   PMFS History: Patient Active Problem List   Diagnosis Date Noted  . Cervicalgia 03/22/2016  . Chronic left-sided low back pain without sciatica 03/22/2016  . Acute midline low back pain without sciatica 02/29/2016  . Cough 07/18/2011  . GERD (gastroesophageal reflux disease) 06/07/2011  . Sinusitis acute 06/07/2011  . Epigastric pain 04/13/2011  . Conjunctivitis 04/05/2011  . Costochondritis 03/30/2011  . LYMPHADENOPATHY 03/24/2010  . CARDIAC MURMUR 01/29/2010  . CHEST PAIN UNSPECIFIED 01/29/2010  . SHINGLES 10/05/2009  . DYSPHAGIA 07/27/2009  . FATTY LIVER DISEASE 07/21/2009  . HYPERLIPIDEMIA 07/17/2009  . ANXIETY 07/17/2009  . HYPERTENSION 07/17/2009  . HIATAL HERNIA 07/17/2009  . [place] SYNDROME 07/17/2009  . ARTHRITIS, RHEUMATOID 07/17/2009  . ANOSMIA 07/17/2009  . COMPRESSION FRACTURE, SPINE 07/17/2009   Past Medical History:  Diagnosis Date  . Anxiety   . Hiatal hernia   . Hyperlipidemia   . Hypertension   . Rheumatoid arthritis(714.0)   . Sjogren's syndrome ([place])     Family History  Problem Relation Age of Onset  . Coronary artery disease Mother   . Hypertension Mother   . Diabetes Mother   . Heart  attack Father   . Hypertension Father   . Heart attack Paternal Grandmother   . Stroke Maternal Grandmother   . Breast cancer Maternal Aunt     Past Surgical History:  Procedure Laterality Date  . OOPHORECTOMY     1 ovary remain  . VESICOVAGINAL FISTULA CLOSURE W/ TAH     Social History   Occupational History  .      [place]  .  [employer]   Social History Main Topics  . Smoking status: Never Smoker  . Smokeless tobacco: Never Used  . Alcohol use No  . Drug use: Unknown  . Sexual activity:  Not on file

## 2016-03-29 ENCOUNTER — Ambulatory Visit (INDEPENDENT_AMBULATORY_CARE_PROVIDER_SITE_OTHER): Payer: Self-pay | Admitting: Orthopaedic Surgery

## 2016-05-12 ENCOUNTER — Encounter (INDEPENDENT_AMBULATORY_CARE_PROVIDER_SITE_OTHER): Payer: Self-pay | Admitting: Orthopaedic Surgery

## 2016-05-12 ENCOUNTER — Ambulatory Visit (INDEPENDENT_AMBULATORY_CARE_PROVIDER_SITE_OTHER): Payer: Worker's Compensation | Admitting: Orthopaedic Surgery

## 2016-05-12 ENCOUNTER — Ambulatory Visit (INDEPENDENT_AMBULATORY_CARE_PROVIDER_SITE_OTHER): Payer: Self-pay | Admitting: Orthopaedic Surgery

## 2016-05-12 DIAGNOSIS — M545 Low back pain, unspecified: Secondary | ICD-10-CM

## 2016-05-12 DIAGNOSIS — M542 Cervicalgia: Secondary | ICD-10-CM

## 2016-05-12 NOTE — Progress Notes (Signed)
Office Visit Note   Patient: [name]           Date of Birth: 09/15/1955           MRN: [id number] Visit Date: 05/12/2016              Requested by: No referring provider defined for this encounter. PCP: No PCP Per Patient   Assessment & Plan: Visit Diagnoses:  1. Cervicalgia   2. Acute midline low back pain without sciatica     Plan: At this point that I feel is reasonable to allow her to return to full duty on May 7. Continue with home exercises and Flexeril as needed. Follow-up with me as needed.  Follow-Up Instructions: Return if symptoms worsen or fail to improve.   Orders:  No orders of the defined types were placed in this encounter.  No orders of the defined types were placed in this encounter.     Procedures: No procedures performed   Clinical Data: No additional findings.   Subjective: Chief Complaint  Patient presents with  . Lower Back - Pain  . Neck - Pain    Patient follows up for her neck and back pain. She is currently doing light duty. She has completed physical therapy but is still doing home exercises. She continues to have some spasms in her shoulder. She does feel like she is improving appropriately and that she will be ready for regular duty in a couple weeks.    Review of Systems  Constitutional: Negative.   HENT: Negative.   Eyes: Negative.   Respiratory: Negative.   Cardiovascular: Negative.   Endocrine: Negative.   Musculoskeletal: Negative.   Neurological: Negative.   Hematological: Negative.   Psychiatric/Behavioral: Negative.   All other systems reviewed and are negative.    Objective: Vital Signs: There were no vitals taken for this visit.  Physical Exam  Constitutional: She is oriented to person, place, and time. She appears well-developed and well-nourished.  Pulmonary/Chest: Effort normal.  Neurological: She is alert and oriented to person, place, and time.  Skin: Skin is warm. Capillary refill takes less  than 2 seconds.  Psychiatric: She has a normal mood and affect. Her behavior is normal. Judgment and thought content normal.  Nursing note and vitals reviewed.   Ortho Exam Neck and back exam shows mild tenderness in the chart left trapezius muscle. She has good range of motion of her neck. She does still have some discomfort with movement of her back and her neck. Specialty Comments:  No specialty comments available.  Imaging: No results found.   PMFS History: Patient Active Problem List   Diagnosis Date Noted  . Cervicalgia 03/22/2016  . Chronic left-sided low back pain without sciatica 03/22/2016  . Acute midline low back pain without sciatica 02/29/2016  . Cough 07/18/2011  . GERD (gastroesophageal reflux disease) 06/07/2011  . Sinusitis acute 06/07/2011  . Epigastric pain 04/13/2011  . Conjunctivitis 04/05/2011  . Costochondritis 03/30/2011  . LYMPHADENOPATHY 03/24/2010  . CARDIAC MURMUR 01/29/2010  . CHEST PAIN UNSPECIFIED 01/29/2010  . SHINGLES 10/05/2009  . DYSPHAGIA 07/27/2009  . FATTY LIVER DISEASE 07/21/2009  . HYPERLIPIDEMIA 07/17/2009  . ANXIETY 07/17/2009  . HYPERTENSION 07/17/2009  . HIATAL HERNIA 07/17/2009  . [place] SYNDROME 07/17/2009  . ARTHRITIS, RHEUMATOID 07/17/2009  . ANOSMIA 07/17/2009  . COMPRESSION FRACTURE, SPINE 07/17/2009   Past Medical History:  Diagnosis Date  . Anxiety   . Hiatal hernia   . Hyperlipidemia   .  Hypertension   . Rheumatoid arthritis(714.0)   . Sjogren's syndrome ([place])     Family History  Problem Relation Age of Onset  . Coronary artery disease Mother   . Hypertension Mother   . Diabetes Mother   . Heart attack Father   . Hypertension Father   . Heart attack Paternal Grandmother   . Stroke Maternal Grandmother   . Breast cancer Maternal Aunt     Past Surgical History:  Procedure Laterality Date  . OOPHORECTOMY     1 ovary remain  . VESICOVAGINAL FISTULA CLOSURE W/ TAH     Social History   Occupational  History  .      [place]  .  [place]   Social History Main Topics  . Smoking status: Never Smoker  . Smokeless tobacco: Never Used  . Alcohol use No  . Drug use: Unknown  . Sexual activity: Not on file

## 2016-05-24 ENCOUNTER — Ambulatory Visit (INDEPENDENT_AMBULATORY_CARE_PROVIDER_SITE_OTHER): Payer: Self-pay | Admitting: Orthopaedic Surgery

## 2016-08-15 ENCOUNTER — Telehealth (INDEPENDENT_AMBULATORY_CARE_PROVIDER_SITE_OTHER): Payer: Self-pay | Admitting: Orthopaedic Surgery

## 2016-08-15 NOTE — Telephone Encounter (Signed)
IC Shannon @ [facility]. lmam advised that 4/26 was pts last OV

## 2016-08-25 ENCOUNTER — Ambulatory Visit (INDEPENDENT_AMBULATORY_CARE_PROVIDER_SITE_OTHER): Payer: Worker's Compensation | Admitting: Orthopaedic Surgery

## 2016-08-25 DIAGNOSIS — M5412 Radiculopathy, cervical region: Secondary | ICD-10-CM

## 2016-08-25 NOTE — Addendum Note (Signed)
Addended by: Precious Bard on: 08/25/2016 09:39 AM   Modules accepted: Orders

## 2016-08-25 NOTE — Progress Notes (Signed)
Office Visit Note   Patient: [name]           Date of Birth: [birth year]           MRN: [id number] Visit Date: 08/25/2016              Requested by: No referring provider defined for this encounter. PCP: Patient, No Pcp Per   Assessment & Plan: Visit Diagnoses:  1. Cervical radiculopathy     Plan:  Cervical spine MRI to rule out structural abnormalities. Follow-up after the MRI.  Follow-Up Instructions: Return in about 3 weeks (around 09/15/2016).   Orders:  No orders of the defined types were placed in this encounter.  No orders of the defined types were placed in this encounter.     Procedures: No procedures performed   Clinical Data: No additional findings.   Subjective: Chief Complaint  Patient presents with  . Neck - Pain  . Lower Back - Pain     Patient is a 61 year old female who follows up today for continued left-sided neck pain with radiation into her left hand with occasional contractures. She has done physical therapy which has helped but has not completely reieved her symptoms.    Review of Systems  Constitutional: Negative.   HENT: Negative.   Eyes: Negative.   Respiratory: Negative.   Cardiovascular: Negative.   Endocrine: Negative.   Musculoskeletal: Negative.   Neurological: Negative.   Hematological: Negative.   Psychiatric/Behavioral: Negative.   All other systems reviewed and are negative.    Objective: Vital Signs: There were no vitals taken for this visit.  Physical Exam  Constitutional: She is oriented to person, place, and time. She appears well-developed and well-nourished.  Pulmonary/Chest: Effort normal.  Neurological: She is alert and oriented to person, place, and time.  Skin: Skin is warm. Capillary refill takes less than 2 seconds.  Psychiatric: She has a normal mood and affect. Her behavior is normal. Judgment and thought content normal.  Nursing note and vitals reviewed.   Ortho Exam  normal reflexes.  No pathologic reflexes. Specialty Comments:  No specialty comments available.  Imaging: No results found.   PMFS History: Patient Active Problem List   Diagnosis Date Noted  . Cervical radiculopathy 08/25/2016  . Cervicalgia 03/22/2016  . Chronic left-sided low back pain without sciatica 03/22/2016  . Acute midline low back pain without sciatica 02/29/2016  . Cough 07/18/2011  . GERD (gastroesophageal reflux disease) 06/07/2011  . Sinusitis acute 06/07/2011  . Epigastric pain 04/13/2011  . Conjunctivitis 04/05/2011  . Costochondritis 03/30/2011  . LYMPHADENOPATHY 03/24/2010  . CARDIAC MURMUR 01/29/2010  . CHEST PAIN UNSPECIFIED 01/29/2010  . SHINGLES 10/05/2009  . DYSPHAGIA 07/27/2009  . FATTY LIVER DISEASE 07/21/2009  . HYPERLIPIDEMIA 07/17/2009  . ANXIETY 07/17/2009  . HYPERTENSION 07/17/2009  . HIATAL HERNIA 07/17/2009  . [place] SYNDROME 07/17/2009  . ARTHRITIS, RHEUMATOID 07/17/2009  . ANOSMIA 07/17/2009  . COMPRESSION FRACTURE, SPINE 07/17/2009   Past Medical History:  Diagnosis Date  . Anxiety   . Hiatal hernia   . Hyperlipidemia   . Hypertension   . Rheumatoid arthritis(714.0)   . Sjogren's syndrome ([place])     Family History  Problem Relation Age of Onset  . Coronary artery disease Mother   . Hypertension Mother   . Diabetes Mother   . Heart attack Father   . Hypertension Father   . Heart attack Paternal Grandmother   . Stroke Maternal Grandmother   . Breast cancer Maternal  Aunt     Past Surgical History:  Procedure Laterality Date  . OOPHORECTOMY     1 ovary remain  . VESICOVAGINAL FISTULA CLOSURE W/ TAH     Social History   Occupational History  .      [place]  .  [employer]   Social History Main Topics  . Smoking status: Never Smoker  . Smokeless tobacco: Never Used  . Alcohol use No  . Drug use: Unknown  . Sexual activity: Not on file

## 2016-09-04 ENCOUNTER — Ambulatory Visit
Admission: RE | Admit: 2016-09-04 | Discharge: 2016-09-04 | Disposition: A | Discharge status: Home / Self Care | Payer: Managed Care, Other (non HMO) | Source: Ambulatory Visit | Attending: Orthopaedic Surgery | Admitting: Orthopaedic Surgery

## 2016-09-04 DIAGNOSIS — M5412 Radiculopathy, cervical region: Secondary | ICD-10-CM

## 2016-09-12 ENCOUNTER — Encounter (INDEPENDENT_AMBULATORY_CARE_PROVIDER_SITE_OTHER): Payer: Self-pay | Admitting: Orthopaedic Surgery

## 2016-09-12 ENCOUNTER — Ambulatory Visit (INDEPENDENT_AMBULATORY_CARE_PROVIDER_SITE_OTHER): Payer: Worker's Compensation | Admitting: Orthopaedic Surgery

## 2016-09-12 DIAGNOSIS — M5412 Radiculopathy, cervical region: Secondary | ICD-10-CM | POA: Diagnosis not present

## 2016-09-12 NOTE — Progress Notes (Signed)
   Office Visit Note   Patient: [name]           Date of Birth: 05-Aug-1955           MRN: [id number] Visit Date: 09/12/2016              Requested by: No referring provider defined for this encounter. PCP: Patient, No Pcp Per   Assessment & Plan: Visit Diagnoses:  1. Cervical radiculopathy     Plan: MRI shows disc osteophyte complex at C6-7 with left sided foraminal stenosis. She is also has degenerative changes at C5-6. We'll refer to Dr. Ernestina Patches for epidural steroid injection. Questions encouraged and answered. Follow-up as needed.  Follow-Up Instructions: Return if symptoms worsen or fail to improve.   Orders:  No orders of the defined types were placed in this encounter.  No orders of the defined types were placed in this encounter.     Procedures: No procedures performed   Clinical Data: No additional findings.   Subjective: Chief Complaint  Patient presents with  . Neck - Pain    Patient is here today to review her cervical spine MRI. Her symptoms are still left-sided radiculopathy.    Review of Systems   Objective: Vital Signs: There were no vitals taken for this visit.  Physical Exam  Ortho Exam Exam is stable. Specialty Comments:  No specialty comments available.  Imaging: No results found.   PMFS History: Patient Active Problem List   Diagnosis Date Noted  . Cervical radiculopathy 08/25/2016  . Cervicalgia 03/22/2016  . Chronic left-sided low back pain without sciatica 03/22/2016  . Acute midline low back pain without sciatica 02/29/2016  . Cough 07/18/2011  . GERD (gastroesophageal reflux disease) 06/07/2011  . Sinusitis acute 06/07/2011  . Epigastric pain 04/13/2011  . Conjunctivitis 04/05/2011  . Costochondritis 03/30/2011  . LYMPHADENOPATHY 03/24/2010  . CARDIAC MURMUR 01/29/2010  . CHEST PAIN UNSPECIFIED 01/29/2010  . SHINGLES 10/05/2009  . DYSPHAGIA 07/27/2009  . FATTY LIVER DISEASE 07/21/2009  . HYPERLIPIDEMIA  07/17/2009  . ANXIETY 07/17/2009  . HYPERTENSION 07/17/2009  . HIATAL HERNIA 07/17/2009  . [place] SYNDROME 07/17/2009  . ARTHRITIS, RHEUMATOID 07/17/2009  . ANOSMIA 07/17/2009  . COMPRESSION FRACTURE, SPINE 07/17/2009   Past Medical History:  Diagnosis Date  . Anxiety   . Hiatal hernia   . Hyperlipidemia   . Hypertension   . Rheumatoid arthritis(714.0)   . Sjogren's syndrome ([place])     Family History  Problem Relation Age of Onset  . Coronary artery disease Mother   . Hypertension Mother   . Diabetes Mother   . Heart attack Father   . Hypertension Father   . Heart attack Paternal Grandmother   . Stroke Maternal Grandmother   . Breast cancer Maternal Aunt     Past Surgical History:  Procedure Laterality Date  . OOPHORECTOMY     1 ovary remain  . VESICOVAGINAL FISTULA CLOSURE W/ TAH     Social History   Occupational History  .      [employer]  .  [employer]   Social History Main Topics  . Smoking status: Never Smoker  . Smokeless tobacco: Never Used  . Alcohol use No  . Drug use: Unknown  . Sexual activity: Not on file

## 2016-09-12 NOTE — Addendum Note (Signed)
Addended by: Precious Bard on: 09/12/2016 08:16 AM   Modules accepted: Orders

## 2016-09-27 ENCOUNTER — Encounter (INDEPENDENT_AMBULATORY_CARE_PROVIDER_SITE_OTHER): Payer: Self-pay | Admitting: Physical Medicine and Rehabilitation

## 2016-09-27 ENCOUNTER — Telehealth (INDEPENDENT_AMBULATORY_CARE_PROVIDER_SITE_OTHER): Payer: Self-pay | Admitting: Physical Medicine and Rehabilitation

## 2016-09-27 ENCOUNTER — Ambulatory Visit (INDEPENDENT_AMBULATORY_CARE_PROVIDER_SITE_OTHER): Payer: Worker's Compensation | Admitting: Physical Medicine and Rehabilitation

## 2016-09-27 VITALS — BP 168/113 | HR 82

## 2016-09-27 DIAGNOSIS — M542 Cervicalgia: Secondary | ICD-10-CM

## 2016-09-27 DIAGNOSIS — M5412 Radiculopathy, cervical region: Secondary | ICD-10-CM

## 2016-09-27 MED ORDER — DIAZEPAM 5 MG PO TABS: ORAL_TABLET | ORAL | 0 refills | Status: DC

## 2016-09-27 NOTE — Progress Notes (Deleted)
Neck pain since falling on ice in January. Pain is in the middle and more left sided. Radiates down left arm. Sleeps with travel pillow because night is when she feels the pain the most. Had had some mild numbness in finger a couple of times, but says it was nothing too severe.

## 2016-09-29 ENCOUNTER — Encounter (INDEPENDENT_AMBULATORY_CARE_PROVIDER_SITE_OTHER): Payer: Self-pay | Admitting: Physical Medicine and Rehabilitation

## 2016-09-29 NOTE — Progress Notes (Signed)
[name] - 61 y.o. female MRN [id number]  Date of birth: 1955/02/17  Office Visit Note: Visit Date: 09/27/2016 PCP: Patient, No Pcp Per Referred by: No ref. provider found  Subjective: Chief Complaint  Patient presents with  . Neck - Pain   HPI: [name] is a 61 year old female with rheumatoid arthritis and generalized anxiety disorder. She comes in today at the request of Dr. Erlinda Hong for evaluation of possible interventional spine procedure. Her history is that she had a fall on January 30 of this year. She was walking into work and fell by slipping on black ice and landed on her backside. She is evaluated in the emergency department. She initially had more low back and left hip pain. She's been followed by Dr. Erlinda Hong and essentially progressed to have more neck and shoulder pain and arm pain as well. She's been taking ibuprofen and Flexeril. She's also had pretty extensive physical therapy. She has seen some improvement of her pain overall but still continues to have problems. He is kept her on light duty for quite some time. She reports no prior neck pain to the fall on the ice. She does relate her pain to progressing since the fall. She reports pain in the middle of the lower cervical spine more left-sided. It will refer down the left arm and will travel over the dorsal forearm into the middle 2 digits. This is real mild numbness in the middle finger at times. The radiating symptoms into the hand is not severe. She is right-hand dominant. She has trouble sleeping. She sleeps with the travel pillow because at night is when she really feels most of her pain. Dr. Erlinda Hong did obtain an MRI of the cervical spine and this is reviewed with her. This is reviewed below. She has had no associated headache or blurry vision. She's had no focal weakness. She has had no bowel or bladder difficulties.    Review of Systems  Constitutional: Negative for chills, fever, malaise/fatigue and weight loss.  HENT:  Negative for hearing loss and sinus pain.   Eyes: Negative for blurred vision, double vision and photophobia.  Respiratory: Negative for cough and shortness of breath.   Cardiovascular: Negative for chest pain, palpitations and leg swelling.  Gastrointestinal: Negative for abdominal pain, nausea and vomiting.  Genitourinary: Negative for flank pain.  Musculoskeletal: Positive for back pain and neck pain. Negative for myalgias.  Skin: Negative for itching and rash.  Neurological: Positive for tingling. Negative for tremors, focal weakness and weakness.  Endo/Heme/Allergies: Negative.   Psychiatric/Behavioral: Negative for depression.  All other systems reviewed and are negative.  Otherwise per HPI.  Assessment & Plan: Visit Diagnoses:  1. Cervical radiculopathy   2. Cervicalgia     Plan: Findings:  Chronic worsening recalcitrant neck pain with some referral into the left shoulder and arm with occasional paresthesias of the middle digit in somewhat of a pretty classic C7 distribution. The radiating symptoms is not as bad as the neck and shoulder pain. She's had extensive physical therapy with some mild improvement. She has taken anti-inflammatories and muscle relaxers. MRI of the cervical spine reviewed does show left facet arthropathy at C5-6 as well as broad-based disc osteophyte and moderate left foraminal narrowing. This would likely irritate the C7 nerve root. She could be getting pain really from both those areas. Those are chronic findings on MRI but can be exacerbated by fall. I leave this point failing conservative care otherwise cervical epidural injection would be worthwhile.  We talked about the risk and benefits and she does want to proceed. We give her preprocedure Valium because of her anxiety level. We will get approval for doing the injection. Conversely if she just didn't get much relief I would look at a one-time facet joint block at C5-6 on the left. Her cases comforted by  rheumatoid arthritis. MRI did not show any cellulitis in the upper cervical region. Otherwise she'll continue to follow up with Dr. Erlinda Hong for her orthopedic care and work restrictions.    Meds & Orders:  Meds ordered this encounter  Medications  . diazepam (VALIUM) 5 MG tablet    Sig: Take 1 by mouth 1 to 2 hours pre-procedure. May repeat if necessary.    Dispense:  2 tablet    Refill:  0   No orders of the defined types were placed in this encounter.   Follow-up: Return for Left C7-T1 intralaminar epidural steroid injection..   Procedures: No procedures performed  No notes on file   Clinical History: MRI CERVICAL SPINE WITHOUT CONTRAST  TECHNIQUE: Multiplanar, multisequence MR imaging of the cervical spine was performed. No intravenous contrast was administered.  COMPARISON:  None.  FINDINGS: Patient motion degrades image quality limiting evaluation.  Alignment: Physiologic.  Vertebrae: No fracture or evidence of discitis. Heterogeneous marrow signal throughout the cervical and upper thoracic spine.  Cord: Normal signal and morphology.  Posterior Fossa, vertebral arteries, paraspinal tissues: Posterior fossa demonstrates no focal abnormality. Vertebral artery flow voids are maintained. Paraspinal soft tissues are unremarkable.  Disc levels:  Discs: Degenerative disc disease with disc height loss C6-7.  C2-3: No significant disc bulge. No neural foraminal stenosis. No central canal stenosis.  C3-4: No significant disc bulge. No neural foraminal stenosis. No central canal stenosis.  C4-5: No significant disc bulge. No neural foraminal stenosis. No central canal stenosis.  C5-6: Broad-based disc bulge. Moderate left facet arthropathy. Mild left foraminal stenosis. No right foraminal stenosis. No central canal stenosis.  C6-7: Broad-based disc osteophyte complex. Bilateral uncovertebral degenerative changes. Moderate left foraminal stenosis. No  right foraminal stenosis. No central canal stenosis.  C7-T1: No significant disc bulge. No neural foraminal stenosis. No central canal stenosis.  T2-3: Small right paracentral disc protrusion. No foraminal or central canal stenosis.  IMPRESSION: 1. At C6-7 there is a broad-based disc osteophyte complex. Bilateral uncovertebral degenerative changes. Moderate left foraminal stenosis. 2. At C5-6 there is a broad-based disc bulge. Moderate left facet arthropathy. Mild left foraminal stenosis. 3. Heterogeneous marrow signal throughout the cervical spine. Although this can be caused by marrow infiltrative processes, the most common causes include anemia, smoking, obesity, or advancing age.   Electronically Signed   By: Kathreen Devoid   On: 09/04/2016 13:58  She reports that she has never smoked. She has never used smokeless tobacco. No results for input(s): HGBA1C, LABURIC in the last 8760 hours.  Objective:  VS:  HT:    WT:   BMI:     BP:(!) 168/113  HR:82bpm  TEMP: ( )  RESP:  Physical Exam  Constitutional: She is oriented to person, place, and time. She appears well-developed and well-nourished. No distress.  HENT:  Head: Normocephalic and atraumatic.  Nose: Nose normal.  Mouth/Throat: Oropharynx is clear and moist.  Eyes: Pupils are equal, round, and reactive to light. Conjunctivae are normal.  Neck: No JVD present. No tracheal deviation present. No thyromegaly present.  Cardiovascular: Regular rhythm and intact distal pulses.   Pulmonary/Chest: Effort normal. No respiratory distress.  Abdominal: She exhibits no distension. There is no guarding.  Musculoskeletal:  Cervical range of motion is limited in ranges of extension and rotation more than flexion. She has a negative Spurling's test bilaterally. She has no real shoulder impingement signs. She has good upper body strength bilaterally and symmetric. She has good reflexes at the biceps triceps and brachioradialis  bilaterally. She has a negative Hoffmann's test bilaterally.  Neurological: She is alert and oriented to person, place, and time. She exhibits normal muscle tone. Coordination normal.  Skin: Skin is warm. No rash noted. No erythema.  Psychiatric: She has a normal mood and affect. Her behavior is normal.  Nursing note and vitals reviewed.   Ortho Exam Imaging: No results found.  Past Medical/Family/Surgical/Social History: Medications & Allergies reviewed per EMR Patient Active Problem List   Diagnosis Date Noted  . Cervical radiculopathy 08/25/2016  . Cervicalgia 03/22/2016  . Chronic left-sided low back pain without sciatica 03/22/2016  . Acute midline low back pain without sciatica 02/29/2016  . Cough 07/18/2011  . GERD (gastroesophageal reflux disease) 06/07/2011  . Sinusitis acute 06/07/2011  . Epigastric pain 04/13/2011  . Conjunctivitis 04/05/2011  . Costochondritis 03/30/2011  . LYMPHADENOPATHY 03/24/2010  . CARDIAC MURMUR 01/29/2010  . CHEST PAIN UNSPECIFIED 01/29/2010  . SHINGLES 10/05/2009  . DYSPHAGIA 07/27/2009  . FATTY LIVER DISEASE 07/21/2009  . HYPERLIPIDEMIA 07/17/2009  . ANXIETY 07/17/2009  . HYPERTENSION 07/17/2009  . HIATAL HERNIA 07/17/2009  . [place] SYNDROME 07/17/2009  . ARTHRITIS, RHEUMATOID 07/17/2009  . ANOSMIA 07/17/2009  . COMPRESSION FRACTURE, SPINE 07/17/2009   Past Medical History:  Diagnosis Date  . Anxiety   . Hiatal hernia   . Hyperlipidemia   . Hypertension   . Rheumatoid arthritis(714.0)   . Sjogren's syndrome ([place])    Family History  Problem Relation Age of Onset  . Coronary artery disease Mother   . Hypertension Mother   . Diabetes Mother   . Heart attack Father   . Hypertension Father   . Heart attack Paternal Grandmother   . Stroke Maternal Grandmother   . Breast cancer Maternal Aunt    Past Surgical History:  Procedure Laterality Date  . OOPHORECTOMY     1 ovary remain  . VESICOVAGINAL FISTULA CLOSURE W/ TAH       Social History   Occupational History  .      [place]  .  [employer]   Social History Main Topics  . Smoking status: Never Smoker  . Smokeless tobacco: Never Used  . Alcohol use No  . Drug use: Unknown  . Sexual activity: Not on file

## 2016-09-29 NOTE — Telephone Encounter (Signed)
done

## 2016-10-06 NOTE — Telephone Encounter (Signed)
Faxed auth request and Dr. Romona Curls office note and the MRI report to adj Charissa Bash [phone]

## 2016-11-24 NOTE — Telephone Encounter (Signed)
Left vm for adj to call me back regarding auth status

## 2017-03-16 DIAGNOSIS — M797 Fibromyalgia: Secondary | ICD-10-CM | POA: Insufficient documentation

## 2017-06-08 ENCOUNTER — Encounter (HOSPITAL_COMMUNITY): Payer: Self-pay | Admitting: Emergency Medicine

## 2017-06-08 ENCOUNTER — Ambulatory Visit (HOSPITAL_COMMUNITY)
Admission: EM | Admit: 2017-06-08 | Discharge: 2017-06-08 | Disposition: A | Discharge status: Home / Self Care | Payer: BLUE CROSS/BLUE SHIELD | Attending: Family Medicine | Admitting: Family Medicine

## 2017-06-08 DIAGNOSIS — I10 Essential (primary) hypertension: Secondary | ICD-10-CM | POA: Diagnosis not present

## 2017-06-08 MED ORDER — LISINOPRIL-HYDROCHLOROTHIAZIDE 20-25 MG PO TABS: 1.0000 | ORAL_TABLET | Freq: Every day | ORAL | 2 refills | Status: DC

## 2017-06-08 NOTE — ED Provider Notes (Signed)
[place]    CSN: [id number] Arrival date & time: 06/08/17  1000     History   Chief Complaint Chief Complaint  Patient presents with  . Hypertension    HPI [name] is a 62 y.o. female.   HPI   Patient has known hypertension for many years.  She previously took lisinopril/hydrochlorothiazide.  She ran out of it a couple of months ago.  She is been following her blood pressure at home.  She also frequently sees an orthopedic and rehab provider for cervical disc disease.  Today, she was scheduled to have an epidural injection.  She was unable to have this because of her elevated blood pressure.  She states they took it 3 different times.  It was always in the 190/100 range.  She feels well.  No headache.  No dizzy spell.  No nausea.  She is here requesting to go back on her blood pressure medication.  She is also requesting assistance to see a family practice doctor/PCP.  She has no complications from a blood pressure standpoint, denies any heart disease kidney disease or any cholesterol problems/circulatory issues.  Past Medical History:  Diagnosis Date  . Anxiety   . Hiatal hernia   . Hyperlipidemia   . Hypertension   . Rheumatoid arthritis(714.0)   . Sjogren's syndrome [facility])     Patient Active Problem List   Diagnosis Date Noted  . Cervical radiculopathy 08/25/2016  . Cervicalgia 03/22/2016  . Chronic left-sided low back pain without sciatica 03/22/2016  . Acute midline low back pain without sciatica 02/29/2016  . Cough 07/18/2011  . GERD (gastroesophageal reflux disease) 06/07/2011  . Sinusitis acute 06/07/2011  . Epigastric pain 04/13/2011  . Conjunctivitis 04/05/2011  . Costochondritis 03/30/2011  . LYMPHADENOPATHY 03/24/2010  . CARDIAC MURMUR 01/29/2010  . CHEST PAIN UNSPECIFIED 01/29/2010  . SHINGLES 10/05/2009  . DYSPHAGIA 07/27/2009  . FATTY LIVER DISEASE 07/21/2009  . HYPERLIPIDEMIA 07/17/2009  . ANXIETY 07/17/2009  . HYPERTENSION  07/17/2009  . HIATAL HERNIA 07/17/2009  . 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 ARTHRITIS, RHEUMATOID 07/17/2009  . ANOSMIA 07/17/2009  . COMPRESSION FRACTURE, SPINE 07/17/2009    Past Surgical History:  Procedure Laterality Date  . OOPHORECTOMY     1 ovary remain  . VESICOVAGINAL FISTULA CLOSURE W/ TAH      OB History   None      Home Medications    Prior to Admission medications   Medication Sig Start Date End Date Taking? Authorizing Provider  ibuprofen (ADVIL,MOTRIN) 200 MG tablet Take 400-600 mg by mouth every 6 (six) hours as needed for fever, headache, mild pain, moderate pain or cramping.    [provider]  lisinopril-hydrochlorothiazide (PRINZIDE,ZESTORETIC) 20-25 MG tablet Take 1 tablet by mouth daily. 06/08/17   Raylene Everts, MD    Family History Family History  Problem Relation Age of Onset  . Coronary artery disease Mother   . Hypertension Mother   . Diabetes Mother   . Heart attack Father   . Hypertension Father   . Heart attack Paternal Grandmother   . Stroke Maternal Grandmother   . Breast cancer Maternal Aunt     Social History Social History   Tobacco Use  . Smoking status: Never Smoker  . Smokeless tobacco: Never Used  Substance Use Topics  . Alcohol use: No  . Drug use: Not on file     Allergies   Amitriptyline; Latex; and Lipitor [atorvastatin]   Review of Systems Review  of Systems  Constitutional: Negative for chills and fever.  HENT: Negative for congestion, dental problem and ear pain.   Eyes: Negative for pain and visual disturbance.  Respiratory: Negative for cough and shortness of breath.   Cardiovascular: Negative for chest pain and palpitations.  Gastrointestinal: Negative for abdominal pain and vomiting.  Genitourinary: Negative for dysuria and hematuria.  Musculoskeletal: Positive for neck pain and neck stiffness. Negative for arthralgias and back pain.  Skin: Negative for color change and rash.    Neurological: Positive for numbness. Negative for dizziness, seizures, syncope and headaches.       Numbness left arm from neck disease  All other systems reviewed and are negative.    Physical Exam Triage Vital Signs ED Triage Vitals [06/08/17 1019]  Enc Vitals Group     BP (!) 196/77     Pulse Rate 80     Resp 18     Temp 98.1 F (36.7 C)     Temp Source Oral     SpO2 98 %     Weight      Height      Head Circumference      Peak Flow      Pain Score      Pain Loc      Pain Edu?      Excl. in [place]?    No data found.  Updated Vital Signs BP (!) 196/77 (BP Location: Left Arm) Comment: BP reported to Nurse Kenton Kingfisher  Pulse 80   Temp 98.1 F (36.7 C) (Oral)   Resp 18   SpO2 98%   Visual Acuity Right Eye Distance:   Left Eye Distance:   Bilateral Distance:    Right Eye Near:   Left Eye Near:    Bilateral Near:     Physical Exam  Constitutional: She appears well-developed and well-nourished. No distress.  HENT:  Head: Normocephalic and atraumatic.  Mouth/Throat: Oropharynx is clear and moist.  Eyes: Conjunctivae are normal.  Neck:  Stiff and guarded neck movements  Cardiovascular: Normal rate and regular rhythm.  Murmur heard. Soft systolic murmur  Pulmonary/Chest: Effort normal and breath sounds normal. No respiratory distress.  Abdominal: Soft. There is no tenderness.  No hepatosplenomegaly  Musculoskeletal: Normal range of motion. She exhibits no edema.  Neurological: She is alert.  Skin: Skin is warm and dry. Pallor:   Psychiatric: She has a normal mood and affect.  Nursing note and vitals reviewed.    UC Treatments / Results  Labs (all labs ordered are listed, but only abnormal results are displayed) Labs Reviewed - No data to display  EKG None  Radiology No results found.  Procedures Procedures (including critical care time)  Medications Ordered in UC Medications - No data to display  Initial Impression / Assessment and Plan / UC  Course  I have reviewed the triage vital signs and the nursing notes.  Pertinent labs & imaging results that were available during my care of the patient were reviewed by me and considered in my medical decision making (see chart for details).     Final Clinical Impressions(s) / UC Diagnoses   Final diagnoses:  Essential hypertension     Discharge Instructions     Take the blood pressure medicine daily Off work today Check BP daily at home, and keep log until you see PCP    ED Prescriptions    Medication Sig Dispense Auth. Provider   lisinopril-hydrochlorothiazide (PRINZIDE,ZESTORETIC) 20-25 MG tablet Take 1 tablet by  mouth daily. 30 tablet Raylene Everts, MD     Controlled Substance Prescriptions [place] Controlled Substance Registry consulted? Not Applicable   Raylene Everts, MD 06/08/17 1050

## 2017-06-08 NOTE — ED Triage Notes (Signed)
Pt went to get an injection at the office today for pain, and they took her BP and it was 199/103, pt has been out of her BP meds for 5 months.

## 2017-06-08 NOTE — Discharge Instructions (Signed)
Take the blood pressure medicine daily Off work today Check BP daily at home, and keep log until you see PCP

## 2017-08-30 ENCOUNTER — Other Ambulatory Visit: Payer: Self-pay | Admitting: Family Medicine

## 2017-09-01 ENCOUNTER — Other Ambulatory Visit: Payer: Self-pay | Admitting: Family Medicine

## 2017-09-01 DIAGNOSIS — Z1231 Encounter for screening mammogram for malignant neoplasm of breast: Secondary | ICD-10-CM

## 2017-09-29 ENCOUNTER — Ambulatory Visit: Payer: Self-pay

## 2017-09-29 ENCOUNTER — Ambulatory Visit
Admission: RE | Admit: 2017-09-29 | Discharge: 2017-09-29 | Disposition: A | Discharge status: Home / Self Care | Payer: BLUE CROSS/BLUE SHIELD | Source: Ambulatory Visit | Attending: Family Medicine | Admitting: Family Medicine

## 2017-09-29 DIAGNOSIS — M3501 Sicca syndrome with keratoconjunctivitis: Secondary | ICD-10-CM | POA: Diagnosis not present

## 2017-09-29 DIAGNOSIS — Z1231 Encounter for screening mammogram for malignant neoplasm of breast: Secondary | ICD-10-CM | POA: Diagnosis not present

## 2017-09-29 DIAGNOSIS — R768 Other specified abnormal immunological findings in serum: Secondary | ICD-10-CM | POA: Diagnosis not present

## 2017-09-29 DIAGNOSIS — M3500 Sicca syndrome, unspecified: Secondary | ICD-10-CM | POA: Diagnosis not present

## 2017-10-11 DIAGNOSIS — R0789 Other chest pain: Secondary | ICD-10-CM | POA: Diagnosis not present

## 2017-10-11 DIAGNOSIS — L98411 Non-pressure chronic ulcer of buttock limited to breakdown of skin: Secondary | ICD-10-CM | POA: Diagnosis not present

## 2017-10-11 DIAGNOSIS — I517 Cardiomegaly: Secondary | ICD-10-CM | POA: Diagnosis not present

## 2017-10-11 DIAGNOSIS — E7800 Pure hypercholesterolemia, unspecified: Secondary | ICD-10-CM | POA: Diagnosis not present

## 2017-10-11 DIAGNOSIS — I10 Essential (primary) hypertension: Secondary | ICD-10-CM | POA: Diagnosis not present

## 2017-10-27 ENCOUNTER — Other Ambulatory Visit: Payer: Self-pay | Admitting: Orthopedic Surgery

## 2017-10-31 NOTE — Pre-Procedure Instructions (Signed)
[name]  10/31/2017      [id number] [place], [place] - [address] [place] [address] Phone: [phone] Fax: [phone]  [id number] [place], [place] [address] Phone: [phone] Fax: [place], [place]  [address] Phone: [phone] Fax: [phone]    Your procedure is scheduled on Thursday October 17.  Report to [facility] Admitting at 5:30 A.M.  Call this number if you have problems the morning of surgery:  [phone]   Remember:  Do not eat or drink after midnight.    Take these medicines the morning of surgery with A SIP OF WATER:   Esomeprazole (prilosec) diphenhydramine (Benadryl) if needed pilocarpine (Salagen)  7 days prior to surgery STOP taking any Aspirin(unless otherwise instructed by your surgeon), Aleve, Naproxen, Ibuprofen, Motrin, Advil, Goody's, BC's, all herbal medications, fish oil, and all vitamins     Do not wear jewelry, make-up or nail polish.  Do not wear lotions, powders, or perfumes, or deodorant.  Do not shave 48 hours prior to surgery.  Men may shave face and neck.  Do not bring valuables to the hospital.  [facility] is not responsible for any belongings or valuables.  Contacts, dentures or bridgework may not be worn into surgery.  Leave your suitcase in the car.  After surgery it may be brought to your room.  For patients admitted to the hospital, discharge time will be determined by your treatment team.  Patients discharged the day of surgery will not be allowed to drive home.   Special instructions:    [place]- Preparing For Surgery  Before surgery, you can play an important role. Because skin is not sterile, your skin needs to be as free of germs as possible. You can reduce the number  of germs on your skin by washing with CHG (chlorahexidine gluconate) Soap before surgery.  CHG is an antiseptic cleaner which kills germs and bonds with the skin to continue killing germs even after washing.    Oral Hygiene is also important to reduce your risk of infection.  Remember - BRUSH YOUR TEETH THE MORNING OF SURGERY WITH YOUR REGULAR TOOTHPASTE  Please do not use if you have an allergy to CHG or antibacterial soaps. If your skin becomes reddened/irritated stop using the CHG.  Do not shave (including legs and underarms) for at least 48 hours prior to first CHG shower. It is OK to shave your face.  Please follow these instructions carefully.   1. Shower the NIGHT BEFORE SURGERY and the MORNING OF SURGERY with CHG.   2. If you chose to wash your hair, wash your hair first as usual with your normal shampoo.  3. After you shampoo, rinse your hair and body thoroughly to remove the shampoo.  4. Use CHG as you would any other liquid soap. You can apply CHG directly to the skin and wash gently with a scrungie or a clean washcloth.   5. Apply the CHG Soap to your body ONLY FROM THE NECK DOWN.  Do not use on open wounds or open sores. Avoid contact with your eyes, ears, mouth and genitals (private parts). Wash Face and genitals (private parts)  with your normal soap.  6. Wash thoroughly, paying special attention to the area where your surgery will be performed.  7. Thoroughly rinse your body with warm water from the neck down.  8. DO NOT shower/wash with your normal soap after using and rinsing off the CHG Soap.  9. Pat yourself dry with a CLEAN TOWEL.  10. Wear CLEAN PAJAMAS to bed the night before surgery, wear comfortable clothes the morning of surgery  11. Place CLEAN SHEETS on your bed the night of your first shower and DO NOT SLEEP WITH PETS.    Day of Surgery:  Do not apply any deodorants/lotions.  Please wear clean clothes to the hospital/surgery center.   Remember to  brush your teeth WITH YOUR REGULAR TOOTHPASTE.    Please read over the following fact sheets that you were given. Coughing and Deep Breathing, MRSA Information and Surgical Site Infection Prevention

## 2017-11-01 ENCOUNTER — Encounter (HOSPITAL_COMMUNITY)
Admission: RE | Admit: 2017-11-01 | Discharge: 2017-11-01 | Disposition: A | Discharge status: Home / Self Care | Payer: No Typology Code available for payment source | Source: Ambulatory Visit | Attending: Orthopedic Surgery | Admitting: Orthopedic Surgery

## 2017-11-01 ENCOUNTER — Other Ambulatory Visit: Payer: Self-pay

## 2017-11-01 ENCOUNTER — Encounter (HOSPITAL_COMMUNITY): Payer: Self-pay

## 2017-11-01 HISTORY — DX: Unspecified osteoarthritis, unspecified site: M19.90

## 2017-11-01 HISTORY — DX: Gastro-esophageal reflux disease without esophagitis: K21.9

## 2017-11-01 HISTORY — DX: Major depressive disorder, single episode, unspecified: F32.9

## 2017-11-01 HISTORY — DX: Anemia, unspecified: D64.9

## 2017-11-01 HISTORY — DX: Nausea with vomiting, unspecified: R11.2

## 2017-11-01 HISTORY — DX: Depression, unspecified: F32.A

## 2017-11-01 HISTORY — DX: Other specified postprocedural states: Z98.890

## 2017-11-01 HISTORY — DX: Other complications of anesthesia, initial encounter: T88.59XA

## 2017-11-01 HISTORY — DX: Cardiac murmur, unspecified: R01.1

## 2017-11-01 HISTORY — DX: Adverse effect of unspecified anesthetic, initial encounter: T41.45XA

## 2017-11-01 HISTORY — DX: Personal history of urinary calculi: Z87.442

## 2017-11-01 LAB — CBC WITH DIFFERENTIAL/PLATELET
Abs Immature Granulocytes: 0.01 10*3/uL (ref 0.00–0.07)
BASOS ABS: 0 10*3/uL (ref 0.0–0.1)
BASOS PCT: 1 %
EOS ABS: 0 10*3/uL (ref 0.0–0.5)
Eosinophils Relative: 1 %
HCT: 39.3 % (ref 36.0–46.0)
HEMOGLOBIN: 12.3 g/dL (ref 12.0–15.0)
Immature Granulocytes: 0 %
LYMPHS PCT: 25 %
Lymphs Abs: 1.2 10*3/uL (ref 0.7–4.0)
MCH: 27.6 pg (ref 26.0–34.0)
MCHC: 31.3 g/dL (ref 30.0–36.0)
MCV: 88.3 fL (ref 80.0–100.0)
MONO ABS: 0.6 10*3/uL (ref 0.1–1.0)
Monocytes Relative: 13 %
NRBC: 0 % (ref 0.0–0.2)
Neutro Abs: 2.8 10*3/uL (ref 1.7–7.7)
Neutrophils Relative %: 60 %
Platelets: 240 10*3/uL (ref 150–400)
RBC: 4.45 MIL/uL (ref 3.87–5.11)
RDW: 12.4 % (ref 11.5–15.5)
WBC: 4.7 10*3/uL (ref 4.0–10.5)

## 2017-11-01 LAB — URINALYSIS, ROUTINE W REFLEX MICROSCOPIC
BILIRUBIN URINE: NEGATIVE
GLUCOSE, UA: NEGATIVE mg/dL
HGB URINE DIPSTICK: NEGATIVE
Ketones, ur: NEGATIVE mg/dL
Leukocytes, UA: NEGATIVE
Nitrite: NEGATIVE
Protein, ur: NEGATIVE mg/dL
SPECIFIC GRAVITY, URINE: 1.008 (ref 1.005–1.030)
pH: 7 (ref 5.0–8.0)

## 2017-11-01 LAB — COMPREHENSIVE METABOLIC PANEL
ALBUMIN: 3.6 g/dL (ref 3.5–5.0)
ALT: 57 U/L — ABNORMAL HIGH (ref 0–44)
ANION GAP: 7 (ref 5–15)
AST: 60 U/L — ABNORMAL HIGH (ref 15–41)
Alkaline Phosphatase: 74 U/L (ref 38–126)
BUN: 11 mg/dL (ref 6–20)
CO2: 26 mmol/L (ref 22–32)
Calcium: 10 mg/dL (ref 8.9–10.3)
Chloride: 103 mmol/L (ref 98–111)
Creatinine, Ser: 0.94 mg/dL (ref 0.44–1.00)
GFR calc non Af Amer: >60 mL/min (ref >60)
Glucose, Bld: 98 mg/dL (ref 70–99)
POTASSIUM: 4.3 mmol/L (ref 3.5–5.1)
SODIUM: 136 mmol/L (ref 135–145)
TOTAL PROTEIN: 9.7 g/dL — AB (ref 6.5–8.1)
Total Bilirubin: 0.4 mg/dL (ref 0.3–1.2)

## 2017-11-01 LAB — TYPE AND SCREEN
ABO/RH(D): AB POS
ANTIBODY SCREEN: NEGATIVE

## 2017-11-01 LAB — SURGICAL PCR SCREEN
MRSA, PCR: NEGATIVE
Staphylococcus aureus: NEGATIVE

## 2017-11-01 LAB — APTT: APTT: 27 s (ref 24–36)

## 2017-11-01 LAB — PROTIME-INR
INR: 1.02
Prothrombin Time: 13.3 seconds (ref 11.4–15.2)

## 2017-11-01 LAB — ABO/RH: ABO/RH(D): AB POS

## 2017-11-01 NOTE — Pre-Procedure Instructions (Signed)
[name]  11/01/2017      [id number] [place], [place] - [address] [place] [address] Phone: [phone] Fax: [phone]  [id number] [place], [place] [address] Phone: [phone] Fax: [place], [place]  [address] Phone: [phone] Fax: [phone]    Your procedure is scheduled on Thursday October 17.  Report to [facility] Admitting at 5:30 A.M.  Call this number if you have problems the morning of surgery:  [phone]   Remember:  Do not eat or drink after midnight.    Take these medicines the morning of surgery with A SIP OF WATER:   Esomeprazole (NEXIUM) diphenhydramine (Benadryl) if needed pilocarpine (Salagen)  7 days prior to surgery STOP taking any Aspirin(unless otherwise instructed by your surgeon), Aleve, Naproxen, Ibuprofen, Motrin, Advil, Goody's, BC's, all herbal medications, fish oil, and all vitamins     Do not wear jewelry, make-up or nail polish.  Do not wear lotions, powders, or perfumes, or deodorant.  Do not shave 48 hours prior to surgery.  Men may shave face and neck.  Do not bring valuables to the hospital.  [facility] is not responsible for any belongings or valuables.  Contacts, dentures or bridgework may not be worn into surgery.  Leave your suitcase in the car.  After surgery it may be brought to your room.  For patients admitted to the hospital, discharge time will be determined by your treatment team.  Patients discharged the day of surgery will not be allowed to drive home.   Special instructions:    [place]- Preparing For Surgery  Before surgery, you can play an important role. Because skin is not sterile, your skin needs to be as free of germs as possible. You can reduce the number  of germs on your skin by washing with CHG (chlorahexidine gluconate) Soap before surgery.  CHG is an antiseptic cleaner which kills germs and bonds with the skin to continue killing germs even after washing.    Oral Hygiene is also important to reduce your risk of infection.  Remember - BRUSH YOUR TEETH THE MORNING OF SURGERY WITH YOUR REGULAR TOOTHPASTE  Please do not use if you have an allergy to CHG or antibacterial soaps. If your skin becomes reddened/irritated stop using the CHG.  Do not shave (including legs and underarms) for at least 48 hours prior to first CHG shower. It is OK to shave your face.  Please follow these instructions carefully.   1. Shower the NIGHT BEFORE SURGERY and the MORNING OF SURGERY with CHG.   2. If you chose to wash your hair, wash your hair first as usual with your normal shampoo.  3. After you shampoo, rinse your hair and body thoroughly to remove the shampoo.  4. Use CHG as you would any other liquid soap. You can apply CHG directly to the skin and wash gently with a scrungie or a clean washcloth.   5. Apply the CHG Soap to your body ONLY FROM THE NECK DOWN.  Do not use on open wounds or open sores. Avoid contact with your eyes, ears, mouth and genitals (private parts). Wash Face and genitals (private parts)  with your normal soap.  6. Wash thoroughly, paying special attention to the area where your surgery will be performed.  7. Thoroughly rinse your body with warm water from the neck down.  8. DO NOT shower/wash with your normal soap after using and rinsing off the CHG Soap.  9. Pat yourself dry with a CLEAN TOWEL.  10. Wear CLEAN PAJAMAS to bed the night before surgery, wear comfortable clothes the morning of surgery  11. Place CLEAN SHEETS on your bed the night of your first shower and DO NOT SLEEP WITH PETS.    Day of Surgery:  Do not apply any deodorants/lotions.  Please wear clean clothes to the hospital/surgery center.   Remember to  brush your teeth WITH YOUR REGULAR TOOTHPASTE.    Please read over the following fact sheets that you were given. Coughing and Deep Breathing, MRSA Information and Surgical Site Infection Prevention

## 2017-11-01 NOTE — Progress Notes (Signed)
Pt. Followed by Dr. Cipriano Mile at [facility]. Requesting ekg, last office note & Cxr if available. Pt. Reports that she had cardiac testing perhaps as late as 2013- for ?murmur, she also recalls that she had an injection with perhaps a stress test. Reports all findings were wnl. Pt. Recently seen by new Rheumatologist at [facility]. Reports that  the Sjogren's is still an  active diagnosis but is only taking Pilocarpine for dry mouth. PFT's have been followed in the past due to changes in the lung regarding Bronchiolitis- related to the autoimmune disorder. . Pt. Denies any current symptoms or changes or concerns regarding her breathing.

## 2017-11-02 ENCOUNTER — Ambulatory Visit (HOSPITAL_COMMUNITY): Payer: No Typology Code available for payment source | Admitting: Certified Registered"

## 2017-11-02 ENCOUNTER — Encounter (HOSPITAL_COMMUNITY): Payer: Self-pay | Admitting: *Deleted

## 2017-11-02 ENCOUNTER — Ambulatory Visit (HOSPITAL_COMMUNITY): Payer: No Typology Code available for payment source

## 2017-11-02 ENCOUNTER — Inpatient Hospital Stay (HOSPITAL_COMMUNITY)
Admission: RE | Disposition: A | Discharge status: Home / Self Care | Payer: Self-pay | Source: Home / Self Care | Attending: Orthopedic Surgery

## 2017-11-02 ENCOUNTER — Inpatient Hospital Stay (HOSPITAL_COMMUNITY)
Admission: RE | Admit: 2017-11-02 | Discharge: 2017-11-03 | DRG: 473 | Disposition: A | Discharge status: Home / Self Care | Payer: No Typology Code available for payment source | Attending: Orthopedic Surgery | Admitting: Orthopedic Surgery

## 2017-11-02 DIAGNOSIS — Z87442 Personal history of urinary calculi: Secondary | ICD-10-CM | POA: Diagnosis not present

## 2017-11-02 DIAGNOSIS — M3500 Sicca syndrome, unspecified: Secondary | ICD-10-CM | POA: Diagnosis present

## 2017-11-02 DIAGNOSIS — I10 Essential (primary) hypertension: Secondary | ICD-10-CM | POA: Diagnosis present

## 2017-11-02 DIAGNOSIS — M069 Rheumatoid arthritis, unspecified: Secondary | ICD-10-CM | POA: Diagnosis present

## 2017-11-02 DIAGNOSIS — E785 Hyperlipidemia, unspecified: Secondary | ICD-10-CM | POA: Diagnosis present

## 2017-11-02 DIAGNOSIS — Z79899 Other long term (current) drug therapy: Secondary | ICD-10-CM

## 2017-11-02 DIAGNOSIS — Z91040 Latex allergy status: Secondary | ICD-10-CM

## 2017-11-02 DIAGNOSIS — Z9889 Other specified postprocedural states: Secondary | ICD-10-CM

## 2017-11-02 DIAGNOSIS — M4802 Spinal stenosis, cervical region: Principal | ICD-10-CM | POA: Diagnosis present

## 2017-11-02 DIAGNOSIS — K219 Gastro-esophageal reflux disease without esophagitis: Secondary | ICD-10-CM | POA: Diagnosis present

## 2017-11-02 DIAGNOSIS — Z8249 Family history of ischemic heart disease and other diseases of the circulatory system: Secondary | ICD-10-CM | POA: Diagnosis not present

## 2017-11-02 DIAGNOSIS — Z888 Allergy status to other drugs, medicaments and biological substances status: Secondary | ICD-10-CM

## 2017-11-02 DIAGNOSIS — M79602 Pain in left arm: Secondary | ICD-10-CM | POA: Diagnosis present

## 2017-11-02 DIAGNOSIS — Z419 Encounter for procedure for purposes other than remedying health state, unspecified: Secondary | ICD-10-CM

## 2017-11-02 DIAGNOSIS — M5412 Radiculopathy, cervical region: Secondary | ICD-10-CM | POA: Diagnosis present

## 2017-11-02 HISTORY — PX: ANTERIOR CERVICAL DECOMP/DISCECTOMY FUSION: SHX1161

## 2017-11-02 SURGERY — ANTERIOR CERVICAL DECOMPRESSION/DISCECTOMY FUSION 2 LEVELS
Anesthesia: General | Site: Spine Cervical

## 2017-11-02 MED ORDER — SUFENTANIL CITRATE 50 MCG/ML IV SOLN
INTRAVENOUS | Status: AC
  Filled 2017-11-02: qty 1

## 2017-11-02 MED ORDER — ONDANSETRON HCL 4 MG/2ML IJ SOLN: 4.0000 mg | Freq: Four times a day (QID) | INTRAMUSCULAR | Status: DC | PRN

## 2017-11-02 MED ORDER — ACETAMINOPHEN 650 MG RE SUPP: 650.0000 mg | RECTAL | Status: DC | PRN

## 2017-11-02 MED ORDER — MIDAZOLAM HCL 5 MG/5ML IJ SOLN
INTRAMUSCULAR | Status: DC | PRN
  Administered 2017-11-02: 2 mg via INTRAVENOUS

## 2017-11-02 MED ORDER — ACETAMINOPHEN 325 MG PO TABS: 650.0000 mg | ORAL_TABLET | ORAL | Status: DC | PRN

## 2017-11-02 MED ORDER — PANTOPRAZOLE SODIUM 40 MG PO TBEC: 80.0000 mg | DELAYED_RELEASE_TABLET | Freq: Every day | ORAL | Status: DC

## 2017-11-02 MED ORDER — MIDAZOLAM HCL 2 MG/2ML IJ SOLN
INTRAMUSCULAR | Status: AC
  Filled 2017-11-02: qty 2

## 2017-11-02 MED ORDER — LISINOPRIL-HYDROCHLOROTHIAZIDE 20-25 MG PO TABS: 1.0000 | ORAL_TABLET | Freq: Every day | ORAL | Status: DC

## 2017-11-02 MED ORDER — PROPOFOL 10 MG/ML IV BOLUS
INTRAVENOUS | Status: DC | PRN
  Administered 2017-11-02: 160 mg via INTRAVENOUS

## 2017-11-02 MED ORDER — OXYCODONE-ACETAMINOPHEN 5-325 MG PO TABS
1.0000 | ORAL_TABLET | ORAL | Status: DC | PRN
  Administered 2017-11-02 (×2): 2 via ORAL
  Filled 2017-11-02 (×2): qty 2

## 2017-11-02 MED ORDER — DIPHENHYDRAMINE HCL 25 MG PO CAPS
25.0000 mg | ORAL_CAPSULE | Freq: Every day | ORAL | Status: DC | PRN
  Administered 2017-11-02: 25 mg via ORAL
  Filled 2017-11-02: qty 1

## 2017-11-02 MED ORDER — MENTHOL 3 MG MT LOZG: 1.0000 | LOZENGE | OROMUCOSAL | Status: DC | PRN

## 2017-11-02 MED ORDER — CEFAZOLIN SODIUM-DEXTROSE 2-4 GM/100ML-% IV SOLN
2.0000 g | Freq: Three times a day (TID) | INTRAVENOUS | Status: AC
  Administered 2017-11-02: 2 g via INTRAVENOUS
  Filled 2017-11-02: qty 100

## 2017-11-02 MED ORDER — LISINOPRIL 20 MG PO TABS
20.0000 mg | ORAL_TABLET | Freq: Every day | ORAL | Status: DC
  Administered 2017-11-02: 20 mg via ORAL
  Filled 2017-11-02: qty 1

## 2017-11-02 MED ORDER — POVIDONE-IODINE 7.5 % EX SOLN
Freq: Once | CUTANEOUS | Status: DC
  Filled 2017-11-02: qty 118

## 2017-11-02 MED ORDER — ONDANSETRON HCL 4 MG PO TABS: 4.0000 mg | ORAL_TABLET | Freq: Four times a day (QID) | ORAL | Status: DC | PRN

## 2017-11-02 MED ORDER — BUPIVACAINE-EPINEPHRINE (PF) 0.25% -1:200000 IJ SOLN
INTRAMUSCULAR | Status: AC
  Filled 2017-11-02: qty 30

## 2017-11-02 MED ORDER — SODIUM CHLORIDE 0.9% FLUSH
3.0000 mL | Freq: Two times a day (BID) | INTRAVENOUS | Status: DC
  Administered 2017-11-02: 3 mL via INTRAVENOUS

## 2017-11-02 MED ORDER — OXYCODONE-ACETAMINOPHEN 5-325 MG PO TABS
1.0000 | ORAL_TABLET | ORAL | Status: DC | PRN
  Administered 2017-11-02 – 2017-11-03 (×2): 2 via ORAL
  Filled 2017-11-02 (×2): qty 2

## 2017-11-02 MED ORDER — DOCUSATE SODIUM 100 MG PO CAPS: 100.0000 mg | ORAL_CAPSULE | Freq: Two times a day (BID) | ORAL | Status: DC

## 2017-11-02 MED ORDER — LACTATED RINGERS IV SOLN
INTRAVENOUS | Status: DC | PRN
  Administered 2017-11-02 (×2): via INTRAVENOUS

## 2017-11-02 MED ORDER — ONDANSETRON HCL 4 MG/2ML IJ SOLN
INTRAMUSCULAR | Status: DC | PRN
  Administered 2017-11-02: 4 mg via INTRAVENOUS

## 2017-11-02 MED ORDER — CEFAZOLIN SODIUM-DEXTROSE 2-4 GM/100ML-% IV SOLN
2.0000 g | Freq: Three times a day (TID) | INTRAVENOUS | Status: DC
  Administered 2017-11-02: 2 g via INTRAVENOUS
  Filled 2017-11-02: qty 100

## 2017-11-02 MED ORDER — SODIUM CHLORIDE 0.9 % IV SOLN
INTRAVENOUS | Status: DC | PRN
  Administered 2017-11-02: 20 ug/min via INTRAVENOUS

## 2017-11-02 MED ORDER — SUGAMMADEX SODIUM 200 MG/2ML IV SOLN
INTRAVENOUS | Status: DC | PRN
  Administered 2017-11-02: 200 mg via INTRAVENOUS

## 2017-11-02 MED ORDER — THROMBIN (RECOMBINANT) 20000 UNITS EX SOLR
CUTANEOUS | Status: AC
  Filled 2017-11-02: qty 20000

## 2017-11-02 MED ORDER — ROCURONIUM BROMIDE 50 MG/5ML IV SOSY
PREFILLED_SYRINGE | INTRAVENOUS | Status: AC
  Filled 2017-11-02: qty 5

## 2017-11-02 MED ORDER — FENTANYL CITRATE (PF) 100 MCG/2ML IJ SOLN
INTRAMUSCULAR | Status: AC
  Administered 2017-11-02: 50 ug via INTRAVENOUS
  Filled 2017-11-02: qty 2

## 2017-11-02 MED ORDER — THROMBIN 20000 UNITS EX KIT
PACK | CUTANEOUS | Status: DC | PRN
  Administered 2017-11-02: 10:00:00 via TOPICAL

## 2017-11-02 MED ORDER — PHENYLEPHRINE 40 MCG/ML (10ML) SYRINGE FOR IV PUSH (FOR BLOOD PRESSURE SUPPORT)
PREFILLED_SYRINGE | INTRAVENOUS | Status: AC
  Filled 2017-11-02: qty 10

## 2017-11-02 MED ORDER — DIAZEPAM 5 MG PO TABS: 5.0000 mg | ORAL_TABLET | Freq: Four times a day (QID) | ORAL | Status: DC | PRN

## 2017-11-02 MED ORDER — HYDROCHLOROTHIAZIDE 25 MG PO TABS: 25.0000 mg | ORAL_TABLET | Freq: Every day | ORAL | Status: DC

## 2017-11-02 MED ORDER — FENTANYL CITRATE (PF) 100 MCG/2ML IJ SOLN
25.0000 ug | INTRAMUSCULAR | Status: DC | PRN
  Administered 2017-11-02 (×3): 50 ug via INTRAVENOUS

## 2017-11-02 MED ORDER — HYDROCHLOROTHIAZIDE 25 MG PO TABS
25.0000 mg | ORAL_TABLET | Freq: Every day | ORAL | Status: DC
  Administered 2017-11-02: 25 mg via ORAL

## 2017-11-02 MED ORDER — LIDOCAINE HCL (CARDIAC) PF 100 MG/5ML IV SOSY
PREFILLED_SYRINGE | INTRAVENOUS | Status: DC | PRN
  Administered 2017-11-02: 100 mg via INTRAVENOUS

## 2017-11-02 MED ORDER — PHENYLEPHRINE HCL 10 MG/ML IJ SOLN
INTRAMUSCULAR | Status: DC | PRN
  Administered 2017-11-02 (×3): 80 ug via INTRAVENOUS

## 2017-11-02 MED ORDER — ONDANSETRON HCL 4 MG/2ML IJ SOLN
INTRAMUSCULAR | Status: AC
  Filled 2017-11-02: qty 2

## 2017-11-02 MED ORDER — DEXAMETHASONE SODIUM PHOSPHATE 10 MG/ML IJ SOLN
INTRAMUSCULAR | Status: DC | PRN
  Administered 2017-11-02: 10 mg via INTRAVENOUS

## 2017-11-02 MED ORDER — PILOCARPINE HCL 5 MG PO TABS
5.0000 mg | ORAL_TABLET | Freq: Three times a day (TID) | ORAL | Status: DC
  Administered 2017-11-02: 5 mg via ORAL
  Filled 2017-11-02 (×2): qty 1

## 2017-11-02 MED ORDER — 0.9 % SODIUM CHLORIDE (POUR BTL) OPTIME
TOPICAL | Status: DC | PRN
  Administered 2017-11-02: 1000 mL

## 2017-11-02 MED ORDER — CEFAZOLIN SODIUM-DEXTROSE 2-4 GM/100ML-% IV SOLN
2.0000 g | INTRAVENOUS | Status: AC
  Administered 2017-11-02: 2 g via INTRAVENOUS
  Filled 2017-11-02: qty 100

## 2017-11-02 MED ORDER — PILOCARPINE HCL 5 MG PO TABS
5.0000 mg | ORAL_TABLET | Freq: Three times a day (TID) | ORAL | Status: DC
  Filled 2017-11-02 (×2): qty 1

## 2017-11-02 MED ORDER — MORPHINE SULFATE (PF) 2 MG/ML IV SOLN: 1.0000 mg | INTRAVENOUS | Status: DC | PRN

## 2017-11-02 MED ORDER — SODIUM CHLORIDE 0.9 % IJ SOLN
INTRAMUSCULAR | Status: AC
  Filled 2017-11-02: qty 10

## 2017-11-02 MED ORDER — DIPHENHYDRAMINE HCL 25 MG PO CAPS: 25.0000 mg | ORAL_CAPSULE | Freq: Every day | ORAL | Status: DC | PRN

## 2017-11-02 MED ORDER — DEXAMETHASONE SODIUM PHOSPHATE 10 MG/ML IJ SOLN
INTRAMUSCULAR | Status: AC
  Filled 2017-11-02: qty 1

## 2017-11-02 MED ORDER — ROCURONIUM 10MG/ML (10ML) SYRINGE FOR MEDFUSION PUMP - OPTIME
INTRAVENOUS | Status: DC | PRN
  Administered 2017-11-02: 50 mg via INTRAVENOUS

## 2017-11-02 MED ORDER — SODIUM CHLORIDE 0.9 % IV SOLN: INTRAVENOUS | Status: DC

## 2017-11-02 MED ORDER — MORPHINE SULFATE (PF) 2 MG/ML IV SOLN
1.0000 mg | INTRAVENOUS | Status: DC | PRN
  Administered 2017-11-02: 1 mg via INTRAVENOUS
  Filled 2017-11-02: qty 1

## 2017-11-02 MED ORDER — BUPIVACAINE-EPINEPHRINE 0.25% -1:200000 IJ SOLN
INTRAMUSCULAR | Status: DC | PRN
  Administered 2017-11-02: 10 mL

## 2017-11-02 MED ORDER — HYDROXYZINE HCL 50 MG/ML IM SOLN: 50.0000 mg | Freq: Four times a day (QID) | INTRAMUSCULAR | Status: DC | PRN

## 2017-11-02 MED ORDER — PROPOFOL 10 MG/ML IV BOLUS
INTRAVENOUS | Status: AC
  Filled 2017-11-02: qty 20

## 2017-11-02 MED ORDER — LISINOPRIL 20 MG PO TABS: 20.0000 mg | ORAL_TABLET | Freq: Every day | ORAL | Status: DC

## 2017-11-02 MED ORDER — SODIUM CHLORIDE 0.9% FLUSH: 3.0000 mL | INTRAVENOUS | Status: DC | PRN

## 2017-11-02 MED ORDER — SUFENTANIL CITRATE 50 MCG/ML IV SOLN
INTRAVENOUS | Status: DC | PRN
  Administered 2017-11-02 (×3): 10 ug via INTRAVENOUS

## 2017-11-02 MED ORDER — PHENOL 1.4 % MT LIQD: 1.0000 | OROMUCOSAL | Status: DC | PRN

## 2017-11-02 MED ORDER — HYDROXYZINE HCL 50 MG/ML IM SOLN
50.0000 mg | Freq: Four times a day (QID) | INTRAMUSCULAR | Status: DC | PRN
  Administered 2017-11-02: 50 mg via INTRAMUSCULAR
  Filled 2017-11-02: qty 1

## 2017-11-02 MED ORDER — PANTOPRAZOLE SODIUM 40 MG PO TBEC
80.0000 mg | DELAYED_RELEASE_TABLET | Freq: Every day | ORAL | Status: DC
  Administered 2017-11-02: 80 mg via ORAL
  Filled 2017-11-02: qty 2

## 2017-11-02 SURGICAL SUPPLY — 77 items
BENZOIN TINCTURE PRP APPL 2/3 (GAUZE/BANDAGES/DRESSINGS) ×3 IMPLANT
BIT DRILL NEURO 2X3.1 SFT TUCH (MISCELLANEOUS) ×1 IMPLANT
BIT DRILL SRG 14X2.2XFLT CHK (BIT) ×1 IMPLANT
BIT DRL SRG 14X2.2XFLT CHK (BIT) ×1
BLADE CLIPPER SURG (BLADE) ×3 IMPLANT
BLADE SURG 15 STRL LF DISP TIS (BLADE) ×1 IMPLANT
BLADE SURG 15 STRL SS (BLADE) ×2
BONE VIVIGEN FORMABLE 1.3CC (Bone Implant) ×3 IMPLANT
BUR MATCHSTICK NEURO 3.0 LAGG (BURR) IMPLANT
CARTRIDGE OIL MAESTRO DRILL (MISCELLANEOUS) ×1 IMPLANT
CLOSURE STERI-STRIP 1/2X4 (GAUZE/BANDAGES/DRESSINGS) ×1
CLOSURE WOUND 1/2 X4 (GAUZE/BANDAGES/DRESSINGS) ×1
CLSR STERI-STRIP ANTIMIC 1/2X4 (GAUZE/BANDAGES/DRESSINGS) ×2 IMPLANT
COLLAR CERV LO CONTOUR FIRM DE (SOFTGOODS) ×3 IMPLANT
CORDS BIPOLAR (ELECTRODE) ×3 IMPLANT
COVER SURGICAL LIGHT HANDLE (MISCELLANEOUS) ×3 IMPLANT
COVER WAND RF STERILE (DRAPES) ×3 IMPLANT
CRADLE DONUT ADULT HEAD (MISCELLANEOUS) ×3 IMPLANT
DECANTER SPIKE VIAL GLASS SM (MISCELLANEOUS) ×3 IMPLANT
DIFFUSER DRILL AIR PNEUMATIC (MISCELLANEOUS) ×3 IMPLANT
DRAIN JACKSON RD 7FR 3/32 (WOUND CARE) IMPLANT
DRAPE C-ARM 42X72 X-RAY (DRAPES) ×3 IMPLANT
DRAPE POUCH INSTRU U-SHP 10X18 (DRAPES) ×3 IMPLANT
DRAPE SURG 17X23 STRL (DRAPES) ×12 IMPLANT
DRILL BIT SKYLINE 14MM (BIT) ×2
DRILL NEURO 2X3.1 SOFT TOUCH (MISCELLANEOUS) ×3
DURAPREP 26ML APPLICATOR (WOUND CARE) ×3 IMPLANT
ELECT COATED BLADE 2.86 ST (ELECTRODE) ×3 IMPLANT
ELECT REM PT RETURN 9FT ADLT (ELECTROSURGICAL) ×3
ELECTRODE REM PT RTRN 9FT ADLT (ELECTROSURGICAL) ×1 IMPLANT
EVACUATOR SILICONE 100CC (DRAIN) IMPLANT
GAUZE 4X4 16PLY RFD (DISPOSABLE) ×3 IMPLANT
GAUZE SPONGE 4X4 12PLY STRL (GAUZE/BANDAGES/DRESSINGS) ×3 IMPLANT
GLOVE BIO SURGEON STRL SZ7 (GLOVE) ×3 IMPLANT
GLOVE BIO SURGEON STRL SZ8 (GLOVE) ×3 IMPLANT
GLOVE BIOGEL PI IND STRL 7.0 (GLOVE) ×2 IMPLANT
GLOVE BIOGEL PI IND STRL 8 (GLOVE) ×1 IMPLANT
GLOVE BIOGEL PI INDICATOR 7.0 (GLOVE) ×4
GLOVE BIOGEL PI INDICATOR 8 (GLOVE) ×2
GOWN STRL REUS W/ TWL LRG LVL3 (GOWN DISPOSABLE) ×1 IMPLANT
GOWN STRL REUS W/ TWL XL LVL3 (GOWN DISPOSABLE) ×1 IMPLANT
GOWN STRL REUS W/TWL LRG LVL3 (GOWN DISPOSABLE) ×2
GOWN STRL REUS W/TWL XL LVL3 (GOWN DISPOSABLE) ×2
INTERLOCK LRDTC CRVCL VBR 7MM (Bone Implant) ×1 IMPLANT
IV CATH 14GX2 1/4 (CATHETERS) ×3 IMPLANT
KIT BASIN OR (CUSTOM PROCEDURE TRAY) ×3 IMPLANT
KIT TURNOVER KIT B (KITS) ×3 IMPLANT
LORDOTIC CERVICAL VBR 7MM SM (Bone Implant) ×3 IMPLANT
MANIFOLD NEPTUNE II (INSTRUMENTS) ×3 IMPLANT
NEEDLE PRECISIONGLIDE 27X1.5 (NEEDLE) ×3 IMPLANT
NEEDLE SPNL 20GX3.5 QUINCKE YW (NEEDLE) ×3 IMPLANT
NS IRRIG 1000ML POUR BTL (IV SOLUTION) ×3 IMPLANT
OIL CARTRIDGE MAESTRO DRILL (MISCELLANEOUS) ×3
PACK ORTHO CERVICAL (CUSTOM PROCEDURE TRAY) ×3 IMPLANT
PAD ARMBOARD 7.5X6 YLW CONV (MISCELLANEOUS) ×6 IMPLANT
PATTIES SURGICAL .5 X.5 (GAUZE/BANDAGES/DRESSINGS) IMPLANT
PATTIES SURGICAL .5 X1 (DISPOSABLE) ×3 IMPLANT
PIN DISTRACTION 14 (PIN) ×6 IMPLANT
PLATE SKYLINE 12MM (Plate) ×3 IMPLANT
SCREW SKYLINE VAR OS 14MM (Screw) ×12 IMPLANT
SPONGE INTESTINAL PEANUT (DISPOSABLE) ×3 IMPLANT
SPONGE SURGIFOAM ABS GEL 100 (HEMOSTASIS) ×3 IMPLANT
STRIP CLOSURE SKIN 1/2X4 (GAUZE/BANDAGES/DRESSINGS) ×2 IMPLANT
SURGIFLO W/THROMBIN 8M KIT (HEMOSTASIS) IMPLANT
SUT MNCRL AB 4-0 PS2 18 (SUTURE) ×3 IMPLANT
SUT SILK 4 0 (SUTURE)
SUT SILK 4-0 18XBRD TIE 12 (SUTURE) IMPLANT
SUT VIC AB 2-0 CT2 18 VCP726D (SUTURE) ×3 IMPLANT
SYR BULB IRRIGATION 50ML (SYRINGE) ×3 IMPLANT
SYR CONTROL 10ML LL (SYRINGE) ×9 IMPLANT
TAPE CLOTH 4X10 WHT NS (GAUZE/BANDAGES/DRESSINGS) ×3 IMPLANT
TAPE CLOTH SURG 4X10 WHT LF (GAUZE/BANDAGES/DRESSINGS) ×3 IMPLANT
TAPE UMBILICAL COTTON 1/8X30 (MISCELLANEOUS) ×3 IMPLANT
TOWEL OR 17X24 6PK STRL BLUE (TOWEL DISPOSABLE) ×3 IMPLANT
TOWEL OR 17X26 10 PK STRL BLUE (TOWEL DISPOSABLE) ×3 IMPLANT
WATER STERILE IRR 1000ML POUR (IV SOLUTION) ×3 IMPLANT
YANKAUER SUCT BULB TIP NO VENT (SUCTIONS) ×3 IMPLANT

## 2017-11-02 NOTE — Transfer of Care (Signed)
Immediate Anesthesia Transfer of Care Note  Patient: [name]  Procedure(s) Performed: ANTERIOR CERVICAL DECOMPRESSION FUSION, CERVICAL 6-7 WITH INSTRUMENTATION AND ALLOGRAFT (N/A Spine Cervical)  Patient Location: PACU  Anesthesia Type:General  Level of Consciousness: oriented, sedated, drowsy, patient cooperative and responds to stimulation  Airway & Oxygen Therapy: Patient Spontanous Breathing and Patient connected to nasal cannula oxygen  Post-op Assessment: Patient moving all extremities X 4  Post vital signs: Reviewed and stable  Last Vitals:  Vitals Value Taken Time  BP 139/74 11/02/2017  9:49 AM  Temp    Pulse 74 11/02/2017  9:50 AM  Resp 16 11/02/2017  9:50 AM  SpO2 98 % 11/02/2017  9:50 AM  Vitals shown include unvalidated device data.  Last Pain:  Vitals:   11/02/17 0622  TempSrc: Oral  PainSc:       Patients Stated Pain Goal: 7 (37/10/62 6948)  Complications: No apparent anesthesia complications

## 2017-11-02 NOTE — Op Note (Signed)
NAME: [name]              MEDICAL RECORD NO.: [id number]    PHYSICIAN:  Phylliss Bob, MD      DATE OF BIRTH:  Mar 26, 1955   DATE OF PROCEDURE:  11/02/2017                               OPERATIVE REPORT   PREOPERATIVE DIAGNOSES: 1. Left-sided cervical radiculopathy. 2. Spinal stenosis, C6/7.  POSTOPERATIVE DIAGNOSES: 1. Left-sided cervical radiculopathy. 2. Spinal stenosis, C6/7.  PROCEDURE: 1. Anterior cervical decompression and fusion C6/7. 2. Placement of anterior instrumentation, C6/7. 3. Insertion of interbody device x1 (18mm Titan intervertebral spacer). 4. Intraoperative use of fluoroscopy. 5. Use of morselized allograft - ViviGen.  SURGEON:  Phylliss Bob, MD  ASSISTANT:  Pricilla Holm, PA-C.  ANESTHESIA:  General endotracheal anesthesia.  COMPLICATIONS:  None.  DISPOSITION:  Stable.  ESTIMATED BLOOD LOSS:  Minimal.  INDICATIONS FOR SURGERY:  Briefly, [name] is a pleasant 62 -year- old female, who did present to me with severe pain in her neck and left arm s/p a work injury.   The patient's MRI did reveal the findings noted above.  Given the ongoing rather debilitating pain and lack of improvement with appropriate treatment measures, we did discuss proceeding with the procedure noted above.  The patient was fully aware of the risks and limitations of surgery as outlined in my preoperative note.  OPERATIVE DETAILS:  On 11/02/2017 , the patient was brought to surgery and general endotracheal anesthesia was administered.  The patient was placed supine on the hospital bed. The neck was gently extended.  All bony prominences were meticulously padded.  The neck was prepped and draped in the usual sterile fashion.  At this point, I did make a left-sided transverse incision.  The platysma was incised.  A Smith-Robinson approach was used and the anterior spine was identified. A self-retaining retractor was placed.  I then subperiosteally  exposed the vertebral bodies from C6-C7.  Caspar pins were then placed into the C6 and C7 vertebral bodies and distraction was applied.  A thorough and complete C6-7 intervertebral diskectomy was performed.  The posterior longitudinal ligament was identified and entered using a nerve hook.  I then used #1 followed by #2 Kerrison to perform a thorough and complete intervertebral diskectomy.  The spinal canal was thoroughly decompressed, as was the left neuroforamen.  The endplates were then prepared and the appropriate-sized intervertebral spacer was then packed with ViviGen and tamped into position in the usual fashion. The Caspar pins  then were removed and bone wax was placed in their place.  The appropriate-sized anterior cervical plate was placed over the anterior spine.  14 mm variable angle screws were placed, 2 in each vertebral body from C6-C7 for a total of 4 vertebral body screws.  The screws were then locked to the plate using the Cam locking mechanism.  I was very pleased with the final fluoroscopic images.  The wound was then irrigated.  The wound was then explored for any undue bleeding and there was no bleeding noted. The wound was then closed in layers using 2-0 Vicryl, followed by 4-0 Monocryl.  Benzoin and Steri-Strips were applied, followed by sterile dressing.  All instrument counts were correct at the termination of the procedure.  Of note, Pricilla Holm, PA-C, was my assistant throughout surgery, and did aid in retraction, suctioning, and closure from start to  finish.     Phylliss Bob, MD

## 2017-11-02 NOTE — Anesthesia Postprocedure Evaluation (Signed)
Anesthesia Post Note  Patient: [name]  Procedure(s) Performed: ANTERIOR CERVICAL DECOMPRESSION FUSION, CERVICAL 6-7 WITH INSTRUMENTATION AND ALLOGRAFT (N/A Spine Cervical)     Patient location during evaluation: PACU Anesthesia Type: General Level of consciousness: awake Pain management: pain level controlled Respiratory status: spontaneous breathing Cardiovascular status: stable Postop Assessment: no apparent nausea or vomiting Anesthetic complications: no    Last Vitals:  Vitals:   11/02/17 1019 11/02/17 1034  BP: (!) 145/81 139/78  Pulse: 73 65  Resp: 11 13  Temp:    SpO2: 99% 93%    Last Pain:  Vitals:   11/02/17 1033  TempSrc:   PainSc: 7                  Gaile Allmon

## 2017-11-02 NOTE — Anesthesia Procedure Notes (Signed)
Procedure Name: Intubation Date/Time: 11/02/2017 7:39 AM Performed by: Claris Che, CRNA Pre-anesthesia Checklist: Patient identified, Emergency Drugs available, Suction available, Patient being monitored and Timeout performed Patient Re-evaluated:Patient Re-evaluated prior to induction Oxygen Delivery Method: Circle system utilized Preoxygenation: Pre-oxygenation with 100% oxygen Induction Type: IV induction and Cricoid Pressure applied Ventilation: Mask ventilation without difficulty Laryngoscope Size: Mac and 3 Grade View: Grade II Tube type: Oral Tube size: 7.5 mm Number of attempts: 1 Airway Equipment and Method: Stylet Placement Confirmation: ETT inserted through vocal cords under direct vision,  positive ETCO2 and breath sounds checked- equal and bilateral Secured at: 23 cm Tube secured with: Tape Dental Injury: Teeth and Oropharynx as per pre-operative assessment

## 2017-11-02 NOTE — H&P (Signed)
PREOPERATIVE H&P  Chief Complaint: Left arm pain  HPI: [name] is a 62 y.o. female who presents with ongoing pain in the left arm  MRI reveals cervical stenosis and left C7 nerve compression  Patient has failed multiple forms of conservative care and continues to have pain (see office notes for additional details regarding the patient's full course of treatment)  Past Medical History:  Diagnosis Date  . Anemia    history  . Anxiety   . Arthritis    mild RA, secondary to Sjogren's   . Complication of anesthesia   . Depression    history of, treated with med. & therapy  . GERD (gastroesophageal reflux disease)   . Heart murmur 2010   told by Dr. Birdie Riddle - seen perhaps on ECHO?  Marland Kitchen Hiatal hernia   . History of kidney stones    passed spontaneously  . Hyperlipidemia   . Hypertension   . PONV (postoperative nausea and vomiting)    mild nausea post op  . Rheumatoid arthritis(714.0)   . Sjogren's syndrome [facility])    Past Surgical History:  Procedure Laterality Date  . OOPHORECTOMY     1 ovary remain, removed adhesions on bowel  . REDUCTION MAMMAPLASTY Bilateral 11/2009  . TONSILLECTOMY     born without tonsils   . VESICOVAGINAL FISTULA CLOSURE W/ TAH     Social History   Socioeconomic History  . Marital status: Married    Spouse name: Not on file  . Number of children: 1  . Years of education: Not on file  . Highest education level: Not on file  Occupational History    Comment: [employer]    Employer: [employer]  . Financial resource strain: Not on file  . Food insecurity:    Worry: Not on file    Inability: Not on file  . Transportation needs:    Medical: Not on file    Non-medical: Not on file  Tobacco Use  . Smoking status: Never Smoker  . Smokeless tobacco: Never Used  Substance and Sexual Activity  . Alcohol use: No  . Drug use: Never  . Sexual activity: Not on file  Lifestyle  . Physical activity:    Days per  week: Not on file    Minutes per session: Not on file  . Stress: Not on file  Relationships  . Social connections:    Talks on phone: Not on file    Gets together: Not on file    Attends religious service: Not on file    Active member of club or organization: Not on file    Attends meetings of clubs or organizations: Not on file    Relationship status: Not on file  Other Topics Concern  . Not on file  Social History Narrative  . Not on file   Family History  Problem Relation Age of Onset  . Coronary artery disease Mother   . Hypertension Mother   . Diabetes Mother   . Heart attack Father   . Hypertension Father   . Heart attack Paternal Grandmother   . Stroke Maternal Grandmother   . Breast cancer Maternal Aunt    Allergies  Allergen Reactions  . Amitriptyline Swelling    Leg swelling   . Latex Other (See Comments)    Burns skin  . Lipitor [Atorvastatin] Itching   Prior to Admission medications   Medication Sig Start Date End Date Taking? Authorizing  Provider  esomeprazole (NEXIUM) 40 MG capsule Take 40 mg by mouth daily at 2 PM.  08/28/17  Yes [provider]  lisinopril-hydrochlorothiazide (PRINZIDE,ZESTORETIC) 20-25 MG tablet Take 1 tablet by mouth daily. 06/08/17  Yes Raylene Everts, MD  pilocarpine (SALAGEN) 5 MG tablet Take 5 mg by mouth 3 (three) times daily. 09/29/17  Yes [provider]  Vitamin D, Ergocalciferol, (DRISDOL) 50000 units CAPS capsule Take 50,000 Units by mouth every Saturday. 10/20/17  Yes [provider]  diphenhydrAMINE (BENADRYL) 25 MG tablet Take 25 mg by mouth daily as needed for allergies.    [provider]  ibuprofen (ADVIL,MOTRIN) 200 MG tablet Take 600 mg by mouth every 6 (six) hours as needed for fever, headache, mild pain, moderate pain or cramping.     [provider]     All other systems have been reviewed and were otherwise negative with the exception of those mentioned in the HPI and  as above.  Physical Exam: Vitals:   11/02/17 0622  BP: 139/84  Pulse: 74  Resp: 20  Temp: 98 F (36.7 C)  SpO2: 100%    Body mass index is 31.07 kg/m.  General: Alert, no acute distress Cardiovascular: No pedal edema Respiratory: No cyanosis, no use of accessory musculature Skin: No lesions in the area of chief complaint Neurologic: Sensation intact distally Psychiatric: Patient is competent for consent with normal mood and affect Lymphatic: No axillary or cervical lymphadenopathy  MUSCULOSKELETAL: + spurling sign on the left  Assessment/Plan: LEFT CERVICAL 7 RADICULOPATHY Plan for Procedure(s): ANTERIOR CERVICAL DECOMPRESSION FUSION, CERVICAL 6-7 WITH INSTRUMENTATION AND ALLOGRAFT   Norva Karvonen, MD 11/02/2017 6:33 AM

## 2017-11-02 NOTE — Anesthesia Preprocedure Evaluation (Signed)
Anesthesia Evaluation  Patient identified by MRN, date of birth, ID band Patient awake    Reviewed: Allergy & Precautions, NPO status , Patient's Chart, lab work & pertinent test results  History of Anesthesia Complications (+) PONV  Airway Mallampati: II  TM Distance: >3 FB     Dental   Pulmonary neg pulmonary ROS,    breath sounds clear to auscultation       Cardiovascular hypertension,  Rhythm:Regular Rate:Normal     Neuro/Psych  Neuromuscular disease    GI/Hepatic hiatal hernia, GERD  ,  Endo/Other    Renal/GU      Musculoskeletal  (+) Arthritis ,   Abdominal   Peds  Hematology  (+) anemia ,   Anesthesia Other Findings   Reproductive/Obstetrics                             Anesthesia Physical Anesthesia Plan  ASA: III  Anesthesia Plan: General   Post-op Pain Management:    Induction: Intravenous  PONV Risk Score and Plan: Ondansetron, Dexamethasone and Midazolam  Airway Management Planned: Oral ETT  Additional Equipment:   Intra-op Plan:   Post-operative Plan:   Informed Consent: I have reviewed the patients History and Physical, chart, labs and discussed the procedure including the risks, benefits and alternatives for the proposed anesthesia with the patient or authorized representative who has indicated his/her understanding and acceptance.   Dental advisory given  Plan Discussed with: CRNA and Anesthesiologist  Anesthesia Plan Comments:         Anesthesia Quick Evaluation

## 2017-11-03 ENCOUNTER — Encounter (HOSPITAL_COMMUNITY): Payer: Self-pay | Admitting: Orthopedic Surgery

## 2017-11-03 MED FILL — Thrombin (Recombinant) For Soln 20000 Unit: CUTANEOUS | Qty: 1 | Status: AC

## 2017-11-03 NOTE — Progress Notes (Signed)
Pt doing well. Pt and husband given D/C instructions with Rx's, verbal understanding was provided. Pt's incision is clean and dry with no sign of infection. Pt's IV was removed prior to D/C. Pt D/C'd home via wheelchair per MD order. Pt is stable @ D/C and has no other needs at this time. Darika Ildefonso, RN  

## 2017-11-03 NOTE — Progress Notes (Signed)
    Patient doing well  Denies arm pain Tolerating PO well   Physical Exam: Vitals:   11/02/17 2259 11/03/17 0453  BP: 114/70 112/66  Pulse: 79 61  Resp: 18 18  Temp: 98.2 F (36.8 C) 98.2 F (36.8 C)  SpO2: 100% 97%    Neck soft/supple Dressing in place NVI  POD #1 s/p C6/7 ACDF, doing well  - encourage ambulation - Percocet for pain, Valium for muscle spasms - d/c home today with f/u in 2 weeks

## 2017-11-09 NOTE — Discharge Summary (Signed)
Patient ID: [name] MRN: [id number] DOB/AGE: 60-01-1955 60 y.o.  Admit date: 11/02/2017 Discharge date: 11/03/2017  Admission Diagnoses:  Active Problems:   Status post surgery   Discharge Diagnoses:  Same  Past Medical History:  Diagnosis Date  . Anemia    history  . Anxiety   . Arthritis    mild RA, secondary to Sjogren's   . Complication of anesthesia   . Depression    history of, treated with med. & therapy  . GERD (gastroesophageal reflux disease)   . Heart murmur 2010   told by Dr. Birdie Riddle - seen perhaps on ECHO?  Marland Kitchen Hiatal hernia   . History of kidney stones    passed spontaneously  . Hyperlipidemia   . Hypertension   . PONV (postoperative nausea and vomiting)    mild nausea post op  . Rheumatoid arthritis(714.0)   . Sjogren's syndrome ([place])     Surgeries: Procedure(s): ANTERIOR CERVICAL DECOMPRESSION FUSION, CERVICAL 6-7 WITH INSTRUMENTATION AND ALLOGRAFT on 11/02/2017   Consultants:  None  Discharged Condition: Improved  Hospital Course: [name] is an 62 y.o. female who was admitted 11/02/2017 for operative treatment of radiculopathy. Patient has severe unremitting pain that affects sleep, daily activities, and work/hobbies. After pre-op clearance the patient was taken to the operating room on 11/02/2017 and underwent  Procedure(s): ANTERIOR CERVICAL DECOMPRESSION FUSION, CERVICAL 6-7 WITH INSTRUMENTATION AND ALLOGRAFT.    Patient was given perioperative antibiotics:  Anti-infectives (From admission, onward)   Start     Dose/Rate Route Frequency Ordered Stop   11/03/17 0600  ceFAZolin (ANCEF) IVPB 2g/100 mL premix     2 g 200 mL/hr over 30 Minutes Intravenous Every 8 hours 11/02/17 2038 11/02/17 2337   11/02/17 1600  ceFAZolin (ANCEF) IVPB 2g/100 mL premix  Status:  Discontinued     2 g 200 mL/hr over 30 Minutes Intravenous Every 8 hours 11/02/17 1106 11/02/17 1956   11/02/17 0600  ceFAZolin (ANCEF) IVPB 2g/100 mL premix     2  g 200 mL/hr over 30 Minutes Intravenous On call to O.R. 11/02/17 0544 11/02/17 0750       Patient was given sequential compression devices, early ambulation to prevent DVT.  Patient benefited maximally from hospital stay and there were no complications.    Recent vital signs: BP 132/69 (BP Location: Right Arm)   Pulse 64   Temp (!) 97.4 F (36.3 C) (Oral)   Resp 19   Ht 5\' 4"  (1.626 m)   Wt 82.1 kg   SpO2 98%   BMI 31.07 kg/m    Discharge Medications:   Allergies as of 11/03/2017      Reactions   Amitriptyline Swelling   Leg swelling   Latex Other (See Comments)   Burns skin   Lipitor [atorvastatin] Itching      Medication List    TAKE these medications   diphenhydrAMINE 25 MG tablet Commonly known as:  BENADRYL Take 25 mg by mouth daily as needed for allergies.   esomeprazole 40 MG capsule Commonly known as:  NEXIUM Take 40 mg by mouth daily at 2 PM.   lisinopril-hydrochlorothiazide 20-25 MG tablet Commonly known as:  PRINZIDE,ZESTORETIC Take 1 tablet by mouth daily.   pilocarpine 5 MG tablet Commonly known as:  SALAGEN Take 5 mg by mouth 3 (three) times daily.   Vitamin D (Ergocalciferol) 50000 units Caps capsule Commonly known as:  DRISDOL Take 50,000 Units by mouth every Saturday.  Diagnostic Studies: Dg Cervical Spine 2-3 Views  Result Date: 11/02/2017 CLINICAL DATA:  Anterior fusion. EXAM: CERVICAL SPINE - 2-3 VIEW; DG C-ARM 60-120 MIN COMPARISON:  CT 02/16/2016. FINDINGS: C6-C7 anterior interbody fusion. Hardware intact. Anatomic alignment. No acute abnormality. IMPRESSION: C6-C7 anterior interbody fusion.  Anatomic alignment. Electronically Signed   By: Marcello Moores  Register   On: 11/02/2017 10:11   Dg C-arm 1-60 Min  Result Date: 11/02/2017 CLINICAL DATA:  Anterior fusion. EXAM: CERVICAL SPINE - 2-3 VIEW; DG C-ARM 60-120 MIN COMPARISON:  CT 02/16/2016. FINDINGS: C6-C7 anterior interbody fusion. Hardware intact. Anatomic alignment. No acute  abnormality. IMPRESSION: C6-C7 anterior interbody fusion.  Anatomic alignment. Electronically Signed   By: Marcello Moores  Register   On: 11/02/2017 10:11    Disposition: Home  Discharge Instructions    Incentive spirometry RT   Complete by:  As directed      POD #1 s/p C6/7 ACDF, doing well  - encourage ambulation - Percocet for pain, Valium for muscle spasms -Written scripts for pain signed and in chart -D/C instructions sheet printed and in chart -D/C today  -F/U in office 2 weeks   Signed: Lennie Muckle Haeden Hudock 11/09/2017, 9:20 AM

## 2018-01-26 ENCOUNTER — Encounter (HOSPITAL_COMMUNITY): Payer: Self-pay | Admitting: Emergency Medicine

## 2018-01-26 ENCOUNTER — Ambulatory Visit (HOSPITAL_COMMUNITY)
Admission: EM | Admit: 2018-01-26 | Discharge: 2018-01-26 | Disposition: A | Discharge status: Home / Self Care | Payer: BLUE CROSS/BLUE SHIELD | Attending: Internal Medicine | Admitting: Internal Medicine

## 2018-01-26 DIAGNOSIS — R109 Unspecified abdominal pain: Secondary | ICD-10-CM | POA: Diagnosis not present

## 2018-01-26 LAB — POCT URINALYSIS DIP (DEVICE)
Bilirubin Urine: NEGATIVE
Glucose, UA: NEGATIVE mg/dL
HGB URINE DIPSTICK: NEGATIVE
Ketones, ur: NEGATIVE mg/dL
NITRITE: NEGATIVE
Protein, ur: NEGATIVE mg/dL
Specific Gravity, Urine: 1.02 (ref 1.005–1.030)
Urobilinogen, UA: 0.2 mg/dL (ref 0.0–1.0)
pH: 5 (ref 5.0–8.0)

## 2018-01-26 MED ORDER — KETOROLAC TROMETHAMINE 60 MG/2ML IM SOLN
60.0000 mg | Freq: Once | INTRAMUSCULAR | Status: AC
  Administered 2018-01-26: 60 mg via INTRAMUSCULAR

## 2018-01-26 MED ORDER — KETOROLAC TROMETHAMINE 60 MG/2ML IM SOLN
INTRAMUSCULAR | Status: AC
  Filled 2018-01-26: qty 2

## 2018-01-26 NOTE — ED Provider Notes (Signed)
[facility]    CSN: [id number] Arrival date & time: 01/26/18  1234     History   Chief Complaint Chief Complaint  Patient presents with  . Flank Pain    HPI [name] is a 62 y.o. female.   She had neck surgery a couple of months ago, and is participating in physical therapy since then.  Took down her Christmas tree a couple of days ago, and that evening had the onset of pain in her right side.  It has gradually increased over the last 2 days, and kind of reminds her of kidney stone pain.  She describes it as constant, but definitely worse with position changes and movement of her torso.  No nausea/vomiting.  No chills or sweats.  No blood in the urine.  No weakness or clumsiness in her legs, no change in bowel or bladder function.  She has sensation intact.  No leg swelling. She was able to walk into the urgent care independently.  HPI  Past Medical History:  Diagnosis Date  . Anemia    history  . Anxiety   . Arthritis    mild RA, secondary to Sjogren's   . Complication of anesthesia   . Depression    history of, treated with med. & therapy  . GERD (gastroesophageal reflux disease)   . Heart murmur 2010   told by Dr. Birdie Riddle - seen perhaps on ECHO?  Marland Kitchen Hiatal hernia   . History of kidney stones    passed spontaneously  . Hyperlipidemia   . Hypertension   . PONV (postoperative nausea and vomiting)    mild nausea post op  . Rheumatoid arthritis(714.0)   . Sjogren's syndrome [facility])     Patient Active Problem List   Diagnosis Date Noted  . Status post surgery 11/02/2017  . Cervical radiculopathy 08/25/2016  . Cervicalgia 03/22/2016  . Chronic left-sided low back pain without sciatica 03/22/2016  . Acute midline low back pain without sciatica 02/29/2016  . Cough 07/18/2011  . GERD (gastroesophageal reflux disease) 06/07/2011  . Sinusitis acute 06/07/2011  . Epigastric pain 04/13/2011  . Conjunctivitis 04/05/2011  . Costochondritis 03/30/2011  .  LYMPHADENOPATHY 03/24/2010  . CARDIAC MURMUR 01/29/2010  . CHEST PAIN UNSPECIFIED 01/29/2010  . SHINGLES 10/05/2009  . DYSPHAGIA 07/27/2009  . FATTY LIVER DISEASE 07/21/2009  . HYPERLIPIDEMIA 07/17/2009  . ANXIETY 07/17/2009  . HYPERTENSION 07/17/2009  . HIATAL HERNIA 07/17/2009  . [place] SYNDROME 07/17/2009  . ARTHRITIS, RHEUMATOID 07/17/2009  . ANOSMIA 07/17/2009  . COMPRESSION FRACTURE, SPINE 07/17/2009    Past Surgical History:  Procedure Laterality Date  . ANTERIOR CERVICAL DECOMP/DISCECTOMY FUSION N/A 11/02/2017   Procedure: ANTERIOR CERVICAL DECOMPRESSION FUSION, CERVICAL 6-7 WITH INSTRUMENTATION AND ALLOGRAFT;  Surgeon: Phylliss Bob, MD;  Location: [place];  Service: Orthopedics;  Laterality: N/A;  . OOPHORECTOMY     1 ovary remain, removed adhesions on bowel  . REDUCTION MAMMAPLASTY Bilateral 11/2009  . TONSILLECTOMY     born without tonsils   . VESICOVAGINAL FISTULA CLOSURE W/ TAH       Home Medications    Prior to Admission medications   Medication Sig Start Date End Date Taking? Authorizing Provider  methocarbamol (ROBAXIN) 500 MG tablet Take 500 mg by mouth 4 (four) times daily.   Yes [provider]  diphenhydrAMINE (BENADRYL) 25 MG tablet Take 25 mg by mouth daily as needed for allergies.    [provider]  esomeprazole (NEXIUM) 40 MG capsule Take 40 mg  by mouth daily at 2 PM.  08/28/17   [provider]  lisinopril-hydrochlorothiazide (PRINZIDE,ZESTORETIC) 20-25 MG tablet Take 1 tablet by mouth daily. 06/08/17   Raylene Everts, MD  pilocarpine (SALAGEN) 5 MG tablet Take 5 mg by mouth 3 (three) times daily. 09/29/17   [provider]  Vitamin D, Ergocalciferol, (DRISDOL) 50000 units CAPS capsule Take 50,000 Units by mouth every Saturday. 10/20/17   [provider]    Family History Family History  Problem Relation Age of Onset  . Coronary artery disease Mother   . Hypertension Mother   . Diabetes Mother   .  Heart attack Father   . Hypertension Father   . Heart attack Paternal Grandmother   . Stroke Maternal Grandmother   . Breast cancer Maternal Aunt     Social History Social History   Tobacco Use  . Smoking status: Never Smoker  . Smokeless tobacco: Never Used  Substance Use Topics  . Alcohol use: No  . Drug use: Never     Allergies   Amitriptyline; Latex; and Lipitor [atorvastatin]   Review of Systems Review of Systems  All other systems reviewed and are negative.    Physical Exam Triage Vital Signs ED Triage Vitals  Enc Vitals Group     BP 01/26/18 1305 (!) 179/96     Pulse Rate 01/26/18 1304 88     Resp 01/26/18 1304 18     Temp 01/26/18 1304 98.2 F (36.8 C)     Temp src --      SpO2 01/26/18 1304 100 %     Weight --      Height --      Pain Score 01/26/18 1305 10     Pain Loc --    Updated Vital Signs BP (!) 179/96   Pulse 88   Temp 98.2 F (36.8 C)   Resp 18   SpO2 100%  Physical Exam Vitals signs and nursing note reviewed.  Constitutional:      General: She is not in acute distress.    Comments: Alert, nicely groomed  HENT:     Head: Atraumatic.  Eyes:     Comments: Conjugate gaze, no eye redness/drainage  Neck:     Musculoskeletal: Neck supple.  Cardiovascular:     Rate and Rhythm: Normal rate.  Pulmonary:     Effort: No respiratory distress.  Abdominal:     General: There is no distension.  Musculoskeletal: Normal range of motion.       Arms:     Comments: No leg swelling Diffuse tenderness to light touch at the right side, extending around to the right low back No rash, no focal bruising/swelling/erythema  Skin:    General: Skin is warm and dry.     Comments: No cyanosis  Neurological:     Mental Status: She is alert and oriented to person, place, and time.      UC Treatments / Results  Labs Results for orders placed or performed during the hospital encounter of 01/26/18  POCT urinalysis dip (device)  Result Value Ref  Range   Glucose, UA NEGATIVE NEGATIVE mg/dL   Bilirubin Urine NEGATIVE NEGATIVE   Ketones, ur NEGATIVE NEGATIVE mg/dL   Specific Gravity, Urine 1.020 1.005 - 1.030   Hgb urine dipstick NEGATIVE NEGATIVE   pH 5.0 5.0 - 8.0   Protein, ur NEGATIVE NEGATIVE mg/dL   Urobilinogen, UA 0.2 0.0 - 1.0 mg/dL   Nitrite NEGATIVE NEGATIVE   Leukocytes, UA  TRACE (A) NEGATIVE    Procedures Procedures (including critical care time)  Medications Ordered in UC Medications  ketorolac (TORADOL) injection 60 mg (60 mg Intramuscular Given 01/26/18 1353)    Final Clinical Impressions(s) / UC Diagnoses   Final diagnoses:  Pain in the side     Discharge Instructions     Pain in your right side seems most likely to be musculoskeletal.  Urine test did not have blood, and pain is worse with movement.  Anticipate gradual improvement over the next several days.  Injection of ketorolac (anti inflammatory/pain reliever) was given today at urgent care to help with pain.  Ice for 5-10 minutes several times daily may also help decrease pain.  Avoid activities that markedly worsen the pain.  Recheck or followup with a sports medicine or orthopedic care provider if pain is not gradually resolving as expected.      ED Prescriptions    None        Wynona Luna, MD 01/28/18 2153

## 2018-01-26 NOTE — Discharge Instructions (Signed)
Pain in your right side seems most likely to be musculoskeletal.  Urine test did not have blood, and pain is worse with movement.  Anticipate gradual improvement over the next several days.  Injection of ketorolac (anti inflammatory/pain reliever) was given today at urgent care to help with pain.  Ice for 5-10 minutes several times daily may also help decrease pain.  Avoid activities that markedly worsen the pain.  Recheck or followup with a sports medicine or orthopedic care provider if pain is not gradually resolving as expected.

## 2018-01-26 NOTE — ED Notes (Signed)
Pt here for R flank pain, hx of kidney stones, states it feels similar to that

## 2018-08-19 DIAGNOSIS — Z03818 Encounter for observation for suspected exposure to other biological agents ruled out: Secondary | ICD-10-CM | POA: Diagnosis not present

## 2018-11-19 ENCOUNTER — Other Ambulatory Visit: Payer: Self-pay

## 2018-11-19 ENCOUNTER — Encounter (HOSPITAL_COMMUNITY): Payer: Self-pay | Admitting: Family Medicine

## 2018-11-19 ENCOUNTER — Ambulatory Visit (HOSPITAL_COMMUNITY)
Admission: EM | Admit: 2018-11-19 | Discharge: 2018-11-19 | Disposition: A | Discharge status: Home / Self Care | Payer: BC Managed Care – PPO | Attending: Family Medicine | Admitting: Family Medicine

## 2018-11-19 DIAGNOSIS — I10 Essential (primary) hypertension: Secondary | ICD-10-CM

## 2018-11-19 DIAGNOSIS — R0789 Other chest pain: Secondary | ICD-10-CM

## 2018-11-19 MED ORDER — LISINOPRIL-HYDROCHLOROTHIAZIDE 20-25 MG PO TABS: 1.0000 | ORAL_TABLET | Freq: Every day | ORAL | 3 refills | Status: DC

## 2018-11-19 NOTE — ED Provider Notes (Signed)
[place]    CSN: [id number] Arrival date & time: 11/19/18  1537      History   Chief Complaint Chief Complaint  Patient presents with  . Chest Pain    HPI [name] is a 63 y.o. female.   63 yo established [facility] patient here for evaluation of chest pain and medication refill.  Pain has been going on for about 2 weeks.  Its intermittent and almost never at night.  Patient describes it as sharp and starting in the sternum and quickly shooting to the back lasting just seconds.  It is unrelated to eating, position, or medicines.  Patient has a history of GERD which feels different from this.  She thinks that the pain is most likely to occur when she is under stress.  She works as a Customer service manager.  Patient has not seen a primary care doctor in a year.  She is looking for a new physician.  She needs her blood pressure medicine refilled.  Patient has no significant shortness of breath, ankle edema, diaphoresis.  Patient has h/o anemia, arthritis, depression, GERD, RA, hypertension, Sjogren's syndrome, cervical radiculopathy, low back pain.     Past Medical History:  Diagnosis Date  . Anemia    history  . Anxiety   . Arthritis    mild RA, secondary to Sjogren's   . Complication of anesthesia   . Depression    history of, treated with med. & therapy  . GERD (gastroesophageal reflux disease)   . Heart murmur 2010   told by Dr. Birdie Riddle - seen perhaps on ECHO?  Marland Kitchen Hiatal hernia   . History of kidney stones    passed spontaneously  . Hyperlipidemia   . Hypertension   . PONV (postoperative nausea and vomiting)    mild nausea post op  . Rheumatoid arthritis(714.0)   . Sjogren's syndrome [facility])     Patient Active Problem List   Diagnosis Date Noted  . Status post surgery 11/02/2017  . Cervical radiculopathy 08/25/2016  . Cervicalgia 03/22/2016  . Chronic left-sided low back pain without sciatica 03/22/2016  . Acute midline low back pain without sciatica  02/29/2016  . Cough 07/18/2011  . GERD (gastroesophageal reflux disease) 06/07/2011  . Sinusitis acute 06/07/2011  . Epigastric pain 04/13/2011  . Conjunctivitis 04/05/2011  . Costochondritis 03/30/2011  . LYMPHADENOPATHY 03/24/2010  . CARDIAC MURMUR 01/29/2010  . CHEST PAIN UNSPECIFIED 01/29/2010  . SHINGLES 10/05/2009  . DYSPHAGIA 07/27/2009  . FATTY LIVER DISEASE 07/21/2009  . HYPERLIPIDEMIA 07/17/2009  . ANXIETY 07/17/2009  . HYPERTENSION 07/17/2009  . HIATAL HERNIA 07/17/2009  . [place] SYNDROME 07/17/2009  . ARTHRITIS, RHEUMATOID 07/17/2009  . ANOSMIA 07/17/2009  . COMPRESSION FRACTURE, SPINE 07/17/2009    Past Surgical History:  Procedure Laterality Date  . ANTERIOR CERVICAL DECOMP/DISCECTOMY FUSION N/A 11/02/2017   Procedure: ANTERIOR CERVICAL DECOMPRESSION FUSION, CERVICAL 6-7 WITH INSTRUMENTATION AND ALLOGRAFT;  Surgeon: Phylliss Bob, MD;  Location: [place];  Service: Orthopedics;  Laterality: N/A;  . OOPHORECTOMY     1 ovary remain, removed adhesions on bowel  . REDUCTION MAMMAPLASTY Bilateral 11/2009  . TONSILLECTOMY     born without tonsils   . VESICOVAGINAL FISTULA CLOSURE W/ TAH      OB History   No obstetric history on file.      Home Medications    Prior to Admission medications   Medication Sig Start Date End Date Taking? Authorizing Provider  diphenhydrAMINE (BENADRYL) 25 MG tablet Take 25 mg by  mouth daily as needed for allergies.    [provider]  esomeprazole (NEXIUM) 40 MG capsule Take 40 mg by mouth daily at 2 PM.  08/28/17   [provider]  lisinopril-hydrochlorothiazide (ZESTORETIC) 20-25 MG tablet Take 1 tablet by mouth daily. 11/19/18   Robyn Haber, MD  methocarbamol (ROBAXIN) 500 MG tablet Take 500 mg by mouth 4 (four) times daily.    [provider]  pilocarpine (SALAGEN) 5 MG tablet Take 5 mg by mouth 3 (three) times daily. 09/29/17   [provider]  Vitamin D, Ergocalciferol, (DRISDOL) 50000  units CAPS capsule Take 50,000 Units by mouth every Saturday. 10/20/17   [provider]    Family History Family History  Problem Relation Age of Onset  . Coronary artery disease Mother   . Hypertension Mother   . Diabetes Mother   . Heart attack Father   . Hypertension Father   . Heart attack Paternal Grandmother   . Stroke Maternal Grandmother   . Breast cancer Maternal Aunt     Social History Social History   Tobacco Use  . Smoking status: Never Smoker  . Smokeless tobacco: Never Used  Substance Use Topics  . Alcohol use: No  . Drug use: Never     Allergies   Amitriptyline, Latex, and Lipitor [atorvastatin]   Review of Systems Review of Systems  Cardiovascular: Positive for chest pain.  All other systems reviewed and are negative.    Physical Exam Triage Vital Signs ED Triage Vitals  Enc Vitals Group     BP      Pulse      Resp      Temp      Temp src      SpO2      Weight      Height      Head Circumference      Peak Flow      Pain Score      Pain Loc      Pain Edu?      Excl. in [place]?    No data found.  Updated Vital Signs BP (!) 173/99 (BP Location: Right Arm)   Pulse 85   Temp 98.5 F (36.9 C) (Oral)   Resp 16   SpO2 97%    Physical Exam Vitals signs and nursing note reviewed.  Constitutional:      Appearance: She is well-developed. She is obese.  Neck:     Musculoskeletal: Normal range of motion and neck supple.  Cardiovascular:     Heart sounds: Normal heart sounds.  Pulmonary:     Effort: Pulmonary effort is normal.  Abdominal:     Palpations: Abdomen is soft.  Musculoskeletal: Normal range of motion.  Skin:    General: Skin is warm and dry.  Neurological:     General: No focal deficit present.     Mental Status: She is alert and oriented to person, place, and time.  Psychiatric:        Mood and Affect: Mood normal.        Behavior: Behavior normal.      UC Treatments / Results  Labs (all labs ordered are  listed, but only abnormal results are displayed) Labs Reviewed - No data to display  EKG 12 lead EKG shows no acute changes.  Radiology No results found.  Procedures Procedures (including critical care time)  Medications Ordered in UC Medications - No data to display  Initial Impression / Assessment and Plan /  UC Course  I have reviewed the triage vital signs and the nursing notes.  Pertinent labs & imaging results that were available during my care of the patient were reviewed by me and considered in my medical decision making (see chart for details).    Final Clinical Impressions(s) / UC Diagnoses   Final diagnoses:  Chest wall pain  Essential hypertension   Discharge Instructions   None    ED Prescriptions    Medication Sig Dispense Auth. Provider   lisinopril-hydrochlorothiazide (ZESTORETIC) 20-25 MG tablet Take 1 tablet by mouth daily. 90 tablet Robyn Haber, MD     I have reviewed the PDMP during this encounter.   Robyn Haber, MD 11/19/18 1615

## 2018-11-19 NOTE — ED Triage Notes (Signed)
Pt presents with intermittent central chest pain that radiates through to back X 7 days.

## 2019-06-10 DIAGNOSIS — M791 Myalgia, unspecified site: Secondary | ICD-10-CM | POA: Diagnosis not present

## 2019-06-10 DIAGNOSIS — I10 Essential (primary) hypertension: Secondary | ICD-10-CM | POA: Diagnosis not present

## 2019-06-10 DIAGNOSIS — K219 Gastro-esophageal reflux disease without esophagitis: Secondary | ICD-10-CM | POA: Diagnosis not present

## 2019-06-10 DIAGNOSIS — M3500 Sicca syndrome, unspecified: Secondary | ICD-10-CM | POA: Diagnosis not present

## 2019-06-10 DIAGNOSIS — Z0000 Encounter for general adult medical examination without abnormal findings: Secondary | ICD-10-CM | POA: Diagnosis not present

## 2019-06-11 DIAGNOSIS — M25562 Pain in left knee: Secondary | ICD-10-CM | POA: Diagnosis not present

## 2019-06-11 DIAGNOSIS — M79642 Pain in left hand: Secondary | ICD-10-CM | POA: Diagnosis not present

## 2019-06-11 DIAGNOSIS — M19041 Primary osteoarthritis, right hand: Secondary | ICD-10-CM | POA: Diagnosis not present

## 2019-06-11 DIAGNOSIS — M25561 Pain in right knee: Secondary | ICD-10-CM | POA: Diagnosis not present

## 2019-06-11 DIAGNOSIS — M79641 Pain in right hand: Secondary | ICD-10-CM | POA: Diagnosis not present

## 2019-06-11 DIAGNOSIS — M25852 Other specified joint disorders, left hip: Secondary | ICD-10-CM | POA: Diagnosis not present

## 2019-06-11 DIAGNOSIS — M1990 Unspecified osteoarthritis, unspecified site: Secondary | ICD-10-CM | POA: Diagnosis not present

## 2019-06-11 DIAGNOSIS — M19042 Primary osteoarthritis, left hand: Secondary | ICD-10-CM | POA: Diagnosis not present

## 2019-06-11 DIAGNOSIS — M359 Systemic involvement of connective tissue, unspecified: Secondary | ICD-10-CM | POA: Diagnosis not present

## 2019-06-11 DIAGNOSIS — M2550 Pain in unspecified joint: Secondary | ICD-10-CM | POA: Diagnosis not present

## 2019-06-11 DIAGNOSIS — M5136 Other intervertebral disc degeneration, lumbar region: Secondary | ICD-10-CM | POA: Diagnosis not present

## 2019-06-11 DIAGNOSIS — M48061 Spinal stenosis, lumbar region without neurogenic claudication: Secondary | ICD-10-CM | POA: Diagnosis not present

## 2019-06-11 DIAGNOSIS — M25851 Other specified joint disorders, right hip: Secondary | ICD-10-CM | POA: Diagnosis not present

## 2019-06-11 DIAGNOSIS — M791 Myalgia, unspecified site: Secondary | ICD-10-CM | POA: Diagnosis not present

## 2019-06-11 DIAGNOSIS — M3500 Sicca syndrome, unspecified: Secondary | ICD-10-CM | POA: Diagnosis not present

## 2019-06-18 DIAGNOSIS — M791 Myalgia, unspecified site: Secondary | ICD-10-CM | POA: Diagnosis not present

## 2019-07-03 DIAGNOSIS — I10 Essential (primary) hypertension: Secondary | ICD-10-CM | POA: Diagnosis not present

## 2019-07-03 DIAGNOSIS — E669 Obesity, unspecified: Secondary | ICD-10-CM | POA: Diagnosis not present

## 2019-07-03 DIAGNOSIS — K760 Fatty (change of) liver, not elsewhere classified: Secondary | ICD-10-CM | POA: Diagnosis not present

## 2019-07-03 DIAGNOSIS — Z0000 Encounter for general adult medical examination without abnormal findings: Secondary | ICD-10-CM | POA: Diagnosis not present

## 2019-07-03 DIAGNOSIS — R7303 Prediabetes: Secondary | ICD-10-CM | POA: Diagnosis not present

## 2019-07-23 DIAGNOSIS — M1990 Unspecified osteoarthritis, unspecified site: Secondary | ICD-10-CM | POA: Diagnosis not present

## 2019-07-23 DIAGNOSIS — M3500 Sicca syndrome, unspecified: Secondary | ICD-10-CM | POA: Diagnosis not present

## 2019-07-23 DIAGNOSIS — M2550 Pain in unspecified joint: Secondary | ICD-10-CM | POA: Diagnosis not present

## 2019-07-23 DIAGNOSIS — M791 Myalgia, unspecified site: Secondary | ICD-10-CM | POA: Diagnosis not present

## 2019-10-01 ENCOUNTER — Other Ambulatory Visit: Payer: Self-pay | Admitting: Pediatric Endocrinology

## 2019-10-01 ENCOUNTER — Other Ambulatory Visit: Payer: Self-pay | Admitting: Internal Medicine

## 2019-10-01 DIAGNOSIS — Z1231 Encounter for screening mammogram for malignant neoplasm of breast: Secondary | ICD-10-CM

## 2019-10-04 ENCOUNTER — Other Ambulatory Visit: Payer: Self-pay | Admitting: Internal Medicine

## 2019-10-04 DIAGNOSIS — N644 Mastodynia: Secondary | ICD-10-CM

## 2019-10-29 ENCOUNTER — Other Ambulatory Visit: Payer: BC Managed Care – PPO

## 2019-11-22 ENCOUNTER — Other Ambulatory Visit: Payer: Self-pay

## 2019-11-22 ENCOUNTER — Ambulatory Visit: Payer: Self-pay

## 2019-11-22 ENCOUNTER — Ambulatory Visit
Admission: RE | Admit: 2019-11-22 | Discharge: 2019-11-22 | Disposition: A | Discharge status: Home / Self Care | Payer: BC Managed Care – PPO | Source: Ambulatory Visit | Attending: Internal Medicine | Admitting: Internal Medicine

## 2019-11-22 DIAGNOSIS — N644 Mastodynia: Secondary | ICD-10-CM

## 2020-03-23 ENCOUNTER — Encounter (HOSPITAL_COMMUNITY): Payer: Self-pay

## 2020-03-23 ENCOUNTER — Other Ambulatory Visit: Payer: Self-pay

## 2020-03-23 ENCOUNTER — Emergency Department (HOSPITAL_COMMUNITY)
Admission: EM | Admit: 2020-03-23 | Discharge: 2020-03-24 | Disposition: A | Discharge status: Home / Self Care | Payer: BC Managed Care – PPO | Attending: Emergency Medicine | Admitting: Emergency Medicine

## 2020-03-23 ENCOUNTER — Emergency Department (HOSPITAL_COMMUNITY): Payer: BC Managed Care – PPO

## 2020-03-23 DIAGNOSIS — R000 Tachycardia, unspecified: Secondary | ICD-10-CM | POA: Insufficient documentation

## 2020-03-23 DIAGNOSIS — Z91040 Latex allergy status: Secondary | ICD-10-CM | POA: Insufficient documentation

## 2020-03-23 DIAGNOSIS — I10 Essential (primary) hypertension: Secondary | ICD-10-CM | POA: Diagnosis not present

## 2020-03-23 DIAGNOSIS — R188 Other ascites: Secondary | ICD-10-CM | POA: Diagnosis not present

## 2020-03-23 DIAGNOSIS — R079 Chest pain, unspecified: Secondary | ICD-10-CM | POA: Diagnosis not present

## 2020-03-23 DIAGNOSIS — K6389 Other specified diseases of intestine: Secondary | ICD-10-CM | POA: Diagnosis not present

## 2020-03-23 DIAGNOSIS — R1013 Epigastric pain: Secondary | ICD-10-CM | POA: Diagnosis not present

## 2020-03-23 DIAGNOSIS — I700 Atherosclerosis of aorta: Secondary | ICD-10-CM | POA: Diagnosis not present

## 2020-03-23 DIAGNOSIS — Z79899 Other long term (current) drug therapy: Secondary | ICD-10-CM | POA: Insufficient documentation

## 2020-03-23 DIAGNOSIS — K529 Noninfective gastroenteritis and colitis, unspecified: Secondary | ICD-10-CM | POA: Diagnosis not present

## 2020-03-23 DIAGNOSIS — K449 Diaphragmatic hernia without obstruction or gangrene: Secondary | ICD-10-CM | POA: Diagnosis not present

## 2020-03-23 LAB — CBC
HCT: 41.9 % (ref 36.0–46.0)
Hemoglobin: 13.2 g/dL (ref 12.0–15.0)
MCH: 27.3 pg (ref 26.0–34.0)
MCHC: 31.5 g/dL (ref 30.0–36.0)
MCV: 86.7 fL (ref 80.0–100.0)
Platelets: 316 10*3/uL (ref 150–400)
RBC: 4.83 MIL/uL (ref 3.87–5.11)
RDW: 13.1 % (ref 11.5–15.5)
WBC: 11.7 10*3/uL — ABNORMAL HIGH (ref 4.0–10.5)
nRBC: 0 % (ref 0.0–0.2)

## 2020-03-23 LAB — BASIC METABOLIC PANEL
Anion gap: 10 (ref 5–15)
BUN: 10 mg/dL (ref 8–23)
CO2: 21 mmol/L — ABNORMAL LOW (ref 22–32)
Calcium: 9.2 mg/dL (ref 8.9–10.3)
Chloride: 101 mmol/L (ref 98–111)
Creatinine, Ser: 0.9 mg/dL (ref 0.44–1.00)
GFR, Estimated: >60 mL/min (ref >60)
Glucose, Bld: 122 mg/dL — ABNORMAL HIGH (ref 70–99)
Potassium: 4.2 mmol/L (ref 3.5–5.1)
Sodium: 132 mmol/L — ABNORMAL LOW (ref 135–145)

## 2020-03-23 LAB — TROPONIN I (HIGH SENSITIVITY)
Troponin I (High Sensitivity): 3 ng/L (ref <18)
Troponin I (High Sensitivity): <2 ng/L (ref <18)

## 2020-03-23 NOTE — ED Triage Notes (Signed)
Pt reports that she is having CP and upper abd pain that started Sat, vomiting and diarrhea today, some dizziness.

## 2020-03-24 ENCOUNTER — Emergency Department (HOSPITAL_COMMUNITY): Payer: BC Managed Care – PPO

## 2020-03-24 DIAGNOSIS — K6389 Other specified diseases of intestine: Secondary | ICD-10-CM | POA: Diagnosis not present

## 2020-03-24 DIAGNOSIS — K449 Diaphragmatic hernia without obstruction or gangrene: Secondary | ICD-10-CM | POA: Diagnosis not present

## 2020-03-24 DIAGNOSIS — R188 Other ascites: Secondary | ICD-10-CM | POA: Diagnosis not present

## 2020-03-24 DIAGNOSIS — I700 Atherosclerosis of aorta: Secondary | ICD-10-CM | POA: Diagnosis not present

## 2020-03-24 LAB — HEPATIC FUNCTION PANEL
ALT: 38 U/L (ref 0–44)
AST: 45 U/L — ABNORMAL HIGH (ref 15–41)
Albumin: 3 g/dL — ABNORMAL LOW (ref 3.5–5.0)
Alkaline Phosphatase: 63 U/L (ref 38–126)
Bilirubin, Direct: <0.1 mg/dL (ref 0.0–0.2)
Total Bilirubin: 0.8 mg/dL (ref 0.3–1.2)
Total Protein: 8.4 g/dL — ABNORMAL HIGH (ref 6.5–8.1)

## 2020-03-24 LAB — LIPASE, BLOOD: Lipase: 27 U/L (ref 11–51)

## 2020-03-24 MED ORDER — IOHEXOL 300 MG/ML  SOLN
100.0000 mL | Freq: Once | INTRAMUSCULAR | Status: AC | PRN
  Administered 2020-03-24: 100 mL via INTRAVENOUS

## 2020-03-24 MED ORDER — ONDANSETRON HCL 4 MG/2ML IJ SOLN
4.0000 mg | Freq: Once | INTRAMUSCULAR | Status: AC
  Administered 2020-03-24: 4 mg via INTRAVENOUS
  Filled 2020-03-24: qty 2

## 2020-03-24 MED ORDER — HYOSCYAMINE SULFATE 0.125 MG SL SUBL
0.2500 mg | SUBLINGUAL_TABLET | Freq: Once | SUBLINGUAL | Status: AC
  Administered 2020-03-24: 0.25 mg via SUBLINGUAL
  Filled 2020-03-24: qty 2

## 2020-03-24 MED ORDER — LIDOCAINE VISCOUS HCL 2 % MT SOLN
15.0000 mL | Freq: Once | OROMUCOSAL | Status: DC
  Filled 2020-03-24: qty 15

## 2020-03-24 MED ORDER — CIPROFLOXACIN HCL 500 MG PO TABS: 500.0000 mg | ORAL_TABLET | Freq: Two times a day (BID) | ORAL | 0 refills | Status: AC

## 2020-03-24 MED ORDER — SODIUM CHLORIDE 0.9 % IV BOLUS
1000.0000 mL | Freq: Once | INTRAVENOUS | Status: AC
  Administered 2020-03-24: 1000 mL via INTRAVENOUS

## 2020-03-24 MED ORDER — ONDANSETRON 4 MG PO TBDP: 4.0000 mg | ORAL_TABLET | Freq: Three times a day (TID) | ORAL | 0 refills | Status: AC | PRN

## 2020-03-24 MED ORDER — ALUM & MAG HYDROXIDE-SIMETH 200-200-20 MG/5ML PO SUSP
30.0000 mL | Freq: Once | ORAL | Status: DC
  Filled 2020-03-24: qty 30

## 2020-03-24 NOTE — ED Provider Notes (Addendum)
[place] EMERGENCY DEPARTMENT Provider Note  CSN: [id number] Arrival date & time: 03/23/20 2014  Chief Complaint(s) Chest Pain  HPI [name] is a 65 y.o. female with a past medical history listed below who presents to the emergency department with 2 days of constant epigastric and periumbilical pain described as a stabbing pain.  Fluctuating in nature.  No overt aggravating factors.  Patient did report mild alleviation with gentle massaging of her abdomen.  She denied any suspicious food intake.  Reported nausea and one bout of emesis today after having soup.  She also reported a bout of diarrhea today as well.  No associated fevers or chills.  No shortness of breath.  Reported chest pain in triage but reports that this was radiation from her abdomen. No current chest pain at this time. No urinary symptoms. Denies any other physical complaints.  HPI  Past Medical History Past Medical History:  Diagnosis Date  . Anemia    history  . Anxiety   . Arthritis    mild RA, secondary to Sjogren's   . Complication of anesthesia   . Depression    history of, treated with med. & therapy  . GERD (gastroesophageal reflux disease)   . Heart murmur 2010   told by Dr. Birdie Riddle - seen perhaps on ECHO?  Marland Kitchen Hiatal hernia   . History of kidney stones    passed spontaneously  . Hyperlipidemia   . Hypertension   . PONV (postoperative nausea and vomiting)    mild nausea post op  . Rheumatoid arthritis(714.0)   . Sjogren's syndrome [facility])    Patient Active Problem List   Diagnosis Date Noted  . Status post surgery 11/02/2017  . Cervical radiculopathy 08/25/2016  . Cervicalgia 03/22/2016  . Chronic left-sided low back pain without sciatica 03/22/2016  . Acute midline low back pain without sciatica 02/29/2016  . Cough 07/18/2011  . GERD (gastroesophageal reflux disease) 06/07/2011  . Sinusitis acute 06/07/2011  . Epigastric pain 04/13/2011  . Conjunctivitis 04/05/2011   . Costochondritis 03/30/2011  . LYMPHADENOPATHY 03/24/2010  . CARDIAC MURMUR 01/29/2010  . CHEST PAIN UNSPECIFIED 01/29/2010  . SHINGLES 10/05/2009  . DYSPHAGIA 07/27/2009  . FATTY LIVER DISEASE 07/21/2009  . HYPERLIPIDEMIA 07/17/2009  . ANXIETY 07/17/2009  . HYPERTENSION 07/17/2009  . HIATAL HERNIA 07/17/2009  . [place] SYNDROME 07/17/2009  . ARTHRITIS, RHEUMATOID 07/17/2009  . ANOSMIA 07/17/2009  . COMPRESSION FRACTURE, SPINE 07/17/2009   Home Medication(s) Prior to Admission medications   Medication Sig Start Date End Date Taking? Authorizing Provider  ciprofloxacin (CIPRO) 500 MG tablet Take 1 tablet (500 mg total) by mouth 2 (two) times daily for 5 days. 03/24/20 03/29/20 Yes Caron Ode, Grayce Sessions, MD  diphenhydrAMINE (BENADRYL) 25 MG tablet Take 25 mg by mouth daily as needed for allergies or sleep.   Yes [provider]  esomeprazole (NEXIUM) 40 MG capsule Take 40 mg by mouth daily. 08/28/17  Yes [provider]  lisinopril-hydrochlorothiazide (ZESTORETIC) 20-25 MG tablet Take 1 tablet by mouth daily. 11/19/18  Yes Robyn Haber, MD  ondansetron (ZOFRAN ODT) 4 MG disintegrating tablet Take 1 tablet (4 mg total) by mouth every 8 (eight) hours as needed for up to 3 days for nausea or vomiting. 03/24/20 03/27/20 Yes Geovannie Vilar, Grayce Sessions, MD  Past Surgical History Past Surgical History:  Procedure Laterality Date  . ANTERIOR CERVICAL DECOMP/DISCECTOMY FUSION N/A 11/02/2017   Procedure: ANTERIOR CERVICAL DECOMPRESSION FUSION, CERVICAL 6-7 WITH INSTRUMENTATION AND ALLOGRAFT;  Surgeon: Phylliss Bob, MD;  Location: [place];  Service: Orthopedics;  Laterality: N/A;  . OOPHORECTOMY     1 ovary remain, removed adhesions on bowel  . REDUCTION MAMMAPLASTY Bilateral 11/2009  . TONSILLECTOMY     born without tonsils   . VESICOVAGINAL FISTULA  CLOSURE W/ TAH     Family History Family History  Problem Relation Age of Onset  . Coronary artery disease Mother   . Hypertension Mother   . Diabetes Mother   . Heart attack Father   . Hypertension Father   . Heart attack Paternal Grandmother   . Stroke Maternal Grandmother   . Breast cancer Maternal Aunt     Social History Social History   Tobacco Use  . Smoking status: Never Smoker  . Smokeless tobacco: Never Used  Substance Use Topics  . Alcohol use: No  . Drug use: Never   Allergies Amitriptyline, Latex, and Lipitor [atorvastatin]  Review of Systems Review of Systems All other systems are reviewed and are negative for acute change except as noted in the HPI  Physical Exam Vital Signs  I have reviewed the triage vital signs BP (!) 141/87   Pulse 98   Temp 98.2 F (36.8 C) (Oral)   Resp 20   SpO2 99%   Physical Exam Vitals reviewed.  Constitutional:      General: She is not in acute distress.    Appearance: She is well-developed and well-nourished. She is not diaphoretic.  HENT:     Head: Normocephalic and atraumatic.     Right Ear: External ear normal.     Left Ear: External ear normal.     Nose: Nose normal.  Eyes:     General: No scleral icterus.    Extraocular Movements: EOM normal.     Conjunctiva/sclera: Conjunctivae normal.  Neck:     Trachea: Phonation normal.  Cardiovascular:     Rate and Rhythm: Normal rate and regular rhythm.  Pulmonary:     Effort: Pulmonary effort is normal. No respiratory distress.     Breath sounds: No stridor.  Abdominal:     General: There is no distension.     Tenderness: There is abdominal tenderness in the epigastric area and left lower quadrant. There is no guarding or rebound.     Hernia: No hernia is present.  Musculoskeletal:        General: No edema. Normal range of motion.     Cervical back: Normal range of motion.  Neurological:     Mental Status: She is alert and oriented to person, place, and time.   Psychiatric:        Mood and Affect: Mood and affect normal.        Behavior: Behavior normal.     ED Results and Treatments Labs (all labs ordered are listed, but only abnormal results are displayed) Labs Reviewed  BASIC METABOLIC PANEL - Abnormal; Notable for the following components:      Result Value   Sodium 132 (*)    CO2 21 (*)    Glucose, Bld 122 (*)    All other components within normal limits  CBC - Abnormal; Notable for the following components:   WBC 11.7 (*)    All other components within normal limits  HEPATIC FUNCTION PANEL - Abnormal; Notable  for the following components:   Total Protein 8.4 (*)    Albumin 3.0 (*)    AST 45 (*)    All other components within normal limits  LIPASE, BLOOD  TROPONIN I (HIGH SENSITIVITY)  TROPONIN I (HIGH SENSITIVITY)                                                                                                                         EKG  EKG Interpretation  Date/Time:  Monday March 23 2020 20:25:25 EST Ventricular Rate:  108 PR Interval:  152 QRS Duration: 72 QT Interval:  330 QTC Calculation: 442 R Axis:   53 Text Interpretation: Sinus tachycardia Otherwise no significant change Confirmed by Addison Lank [phone]) on 03/24/2020 3:06:07 AM      Radiology DG Chest 2 View  Result Date: 03/23/2020 CLINICAL DATA:  Chest pain and known hiatal hernia EXAM: CHEST - 2 VIEW COMPARISON:  10/27/2010 FINDINGS: Cardiac shadow is within normal limits. Hiatal hernia is seen. Lungs are well aerated bilaterally. No focal infiltrate is seen. Postsurgical changes in the cervical spine are noted. IMPRESSION: Hiatal hernia.  No acute abnormality noted. Electronically Signed   By: Inez Catalina M.D.   On: 03/23/2020 21:08   CT ABDOMEN PELVIS W CONTRAST  Result Date: 03/24/2020 CLINICAL DATA:  Rule out diverticulitis EXAM: CT ABDOMEN AND PELVIS WITH CONTRAST TECHNIQUE: Multidetector CT imaging of the abdomen and pelvis was performed using the  standard protocol following bolus administration of intravenous contrast. CONTRAST:  128mL OMNIPAQUE IOHEXOL 300 MG/ML  SOLN COMPARISON:  CT 05/08/2010 FINDINGS: Lower chest: Atelectatic changes in the lung bases. Normal heart size. No pericardial effusion. Coronary artery calcifications are present. Hepatobiliary: No worrisome focal liver lesions. Smooth liver surface contour. Normal hepatic attenuation. Normal gallbladder and biliary tree. Pancreas: No pancreatic ductal dilatation or surrounding inflammatory changes. Spleen: Normal in size. No concerning splenic lesions. Adrenals/Urinary Tract: Normal adrenals. Kidneys are normally located with symmetric enhancementand excretion. Cortical scarring bilaterally. Fluid attenuation cyst in the right kidney measuring up to 1.4 cm, no concerning features. No suspicious renal lesion, urolithiasis or hydronephrosis. Urinary bladder is unremarkable Stomach/Bowel: There is a moderate hiatal hernia with a small amount of low-attenuation adjacent free fluid, possibly redistributed given the absence of concerning features such as thickening or edematous change within the herniated contents. Distal stomach and duodenum are unremarkable. Bland appearance of the proximal small bowel. Clustered loops of more distal small bowel with edematous mural thickening, increased mesenteric vascularity and fluid within the leaflets seen in the right lower quadrant. Some intramural fatty infiltration is noted the cecum and proximal appendix. Small amount of fluid in the right pericolic gutter is favored to be distributed rather than arising from the appendix proper. No significant colonic thickening or dilatation. Similarly, fluid seen in the left pericolic gutter adjacent the descending colon is favored to be redistributed as well. Vascular/Lymphatic: Atherosclerotic calcifications within the abdominal aorta and branch vessels. No aneurysm or ectasia. No enlarged abdominopelvic  lymph nodes.  Reproductive: Uterus is surgically absent. No concerning adnexal lesions. Other: Low-attenuation free fluid centered upon the thickened loops of small bowel in the mid abdomen. Additional free fluid layering in the subphrenic spaces and pericolic gutters as well as the deep pelvis are favored to be reactive as is the trace fluid about the hiatal hernia. No free air. No organized abscess or collection. No bowel containing hernias. Musculoskeletal: No acute osseous abnormality or suspicious osseous lesion. IMPRESSION: 1. Clustered loops of distal small bowel with edematous mural thickening, increased mesenteric vascularity and fluid within the leaflets seen in the right lower quadrant, consistent with enteritis, which could be infectious or inflammatory in etiology. No evidence of perforation or abscess formation. 2. Moderate hiatal hernia 3. Small volume low-attenuation adjacent free fluid, is likely redistributed throughout the abdomen and hiatal hernia sac given the absence of concerning features such as thickening or edematous change within the herniated contents. No free air. No organized abscess or collection. 4. Cortical scarring of both kidneys. 5. Aortic Atherosclerosis (ICD10-I70.0). Electronically Signed   By: Lovena Le M.D.   On: 03/24/2020 03:03    Pertinent labs & imaging results that were available during my care of the patient were reviewed by me and considered in my medical decision making (see chart for details).  Medications Ordered in ED Medications  alum & mag hydroxide-simeth (MAALOX/MYLANTA) 200-200-20 MG/5ML suspension 30 mL (30 mLs Oral Not Given 03/24/20 0207)    And  lidocaine (XYLOCAINE) 2 % viscous mouth solution 15 mL (15 mLs Oral Not Given 03/24/20 0208)  hyoscyamine (LEVSIN SL) SL tablet 0.25 mg (0.25 mg Sublingual Given 03/24/20 0208)  sodium chloride 0.9 % bolus 1,000 mL (0 mLs Intravenous Stopped 03/24/20 0454)  ondansetron (ZOFRAN) injection 4 mg (4 mg Intravenous Given  03/24/20 0220)  iohexol (OMNIPAQUE) 300 MG/ML solution 100 mL (100 mLs Intravenous Contrast Given 03/24/20 0237)                                                                                                                                    Procedures Procedures  (including critical care time)  Medical Decision Making / ED Course I have reviewed the nursing notes for this encounter and the patient's prior records (if available in EHR or on provided paperwork).   Tariana C Merlino was evaluated in Emergency Department on 03/24/2020 for the symptoms described in the history of present illness. She was evaluated in the context of the global COVID-19 pandemic, which necessitated consideration that the patient might be at risk for infection with the SARS-CoV-2 virus that causes COVID-19. Institutional protocols and algorithms that pertain to the evaluation of patients at risk for COVID-19 are in a state of rapid change based on information released by regulatory bodies including the CDC and federal and state organizations. These policies and algorithms were followed during the patient's care in the ED.  64 y.o. female presents  with vomiting, diarrhea, and abd discomfort for 2 days. No known possible suspicious food intake.Marland Kitchen adequate oral tolerance. Rest of history as above.  Patient appears well, not in distress, and with no signs of toxicity or dehydration. Abdomen with mild discomfort, but not peritonitic.  Rest of the exam as above  Labs notable for leukocytosis.  No anemia. No significant electrolyte derangements or renal sufficiency. No evidence of bili obstruction or pancreatitis.  Given the leukocytosis, CT of the abdomen was obtained and revealed evidence of enteritis.  Most likely viral.  Recommended continued supportive management.  Patient given a prescription for ciprofloxacin to cover for possible bacterial process if symptoms do not improve in 3 to 4 days.  Able to tolerate oral intake  in the ED.  Discussed symptomatic treatment with the patient and they will follow closely with their PCP.   Chest pain was from GI process. Not concerning for cardiac etiology.  EKG without acute ischemic changes or evidence of pericarditis.  Troponin negative x2.  Doubt dissection or esophageal perforation.  Doubt PE. Chest x-ray without evidence suggestive of pneumonia, pneumothorax, pneumomediastinum.  No abnormal contour of the mediastinum to suggest dissection. No evidence of acute injuries.        Final Clinical Impression(s) / ED Diagnoses Final diagnoses:  Enteritis   The patient appears reasonably screened and/or stabilized for discharge and I doubt any other medical condition or other [facility] requiring further screening, evaluation, or treatment in the ED at this time prior to discharge. Safe for discharge with strict return precautions.  Disposition: Discharge  Condition: Good  I have discussed the results, Dx and Tx plan with the patient/family who expressed understanding and agree(s) with the plan. Discharge instructions discussed at length. The patient/family was given strict return precautions who verbalized understanding of the instructions. No further questions at time of discharge.    ED Discharge Orders         Ordered    ciprofloxacin (CIPRO) 500 MG tablet  2 times daily        03/24/20 0532    ondansetron (ZOFRAN ODT) 4 MG disintegrating tablet  Every 8 hours PRN        03/24/20 0532           Follow Up: [facility], [place] [address] [phone]  Call  to schedule an appointment for close follow up      This chart was dictated using voice recognition software.  Despite best efforts to proofread,  errors can occur which can change the documentation meaning.     Fatima Blank, MD 03/24/20 [phone]

## 2021-05-26 ENCOUNTER — Encounter (HOSPITAL_COMMUNITY): Payer: Self-pay

## 2021-05-26 ENCOUNTER — Ambulatory Visit (HOSPITAL_COMMUNITY)
Admission: EM | Admit: 2021-05-26 | Discharge: 2021-05-26 | Disposition: A | Discharge status: Home / Self Care | Payer: 59 | Attending: Nurse Practitioner | Admitting: Nurse Practitioner

## 2021-05-26 DIAGNOSIS — R590 Localized enlarged lymph nodes: Secondary | ICD-10-CM | POA: Diagnosis present

## 2021-05-26 DIAGNOSIS — I10 Essential (primary) hypertension: Secondary | ICD-10-CM | POA: Diagnosis present

## 2021-05-26 DIAGNOSIS — Z760 Encounter for issue of repeat prescription: Secondary | ICD-10-CM | POA: Diagnosis present

## 2021-05-26 HISTORY — DX: Fibromyalgia: M79.7

## 2021-05-26 LAB — BASIC METABOLIC PANEL
Anion gap: 7 (ref 5–15)
BUN: 15 mg/dL (ref 8–23)
CO2: 23 mmol/L (ref 22–32)
Calcium: 9.4 mg/dL (ref 8.9–10.3)
Chloride: 109 mmol/L (ref 98–111)
Creatinine, Ser: 0.86 mg/dL (ref 0.44–1.00)
GFR, Estimated: >60 mL/min (ref >60)
Glucose, Bld: 99 mg/dL (ref 70–99)
Potassium: 5.1 mmol/L (ref 3.5–5.1)
Sodium: 139 mmol/L (ref 135–145)

## 2021-05-26 LAB — CBC WITH DIFFERENTIAL/PLATELET
Abs Immature Granulocytes: 0.02 10*3/uL (ref 0.00–0.07)
Basophils Absolute: 0 10*3/uL (ref 0.0–0.1)
Basophils Relative: 1 %
Eosinophils Absolute: 0.1 10*3/uL (ref 0.0–0.5)
Eosinophils Relative: 1 %
HCT: 31.9 % — ABNORMAL LOW (ref 36.0–46.0)
Hemoglobin: 10 g/dL — ABNORMAL LOW (ref 12.0–15.0)
Immature Granulocytes: 0 %
Lymphocytes Relative: 25 %
Lymphs Abs: 1.6 10*3/uL (ref 0.7–4.0)
MCH: 26.9 pg (ref 26.0–34.0)
MCHC: 31.3 g/dL (ref 30.0–36.0)
MCV: 85.8 fL (ref 80.0–100.0)
Monocytes Absolute: 0.8 10*3/uL (ref 0.1–1.0)
Monocytes Relative: 13 %
Neutro Abs: 3.8 10*3/uL (ref 1.7–7.7)
Neutrophils Relative %: 60 %
Platelets: 230 10*3/uL (ref 150–400)
RBC: 3.72 MIL/uL — ABNORMAL LOW (ref 3.87–5.11)
RDW: 13.4 % (ref 11.5–15.5)
WBC: 6.3 10*3/uL (ref 4.0–10.5)
nRBC: 0 % (ref 0.0–0.2)

## 2021-05-26 MED ORDER — LISINOPRIL-HYDROCHLOROTHIAZIDE 20-25 MG PO TABS: 1.0000 | ORAL_TABLET | Freq: Every day | ORAL | 3 refills | Status: DC

## 2021-05-26 NOTE — ED Provider Notes (Signed)
?[facility] ? ? ? ?CSN: [id number] ?Arrival date & time: 05/26/21  1753 ? ? ?  ? ?History   ?Chief Complaint ?Chief Complaint  ?Patient presents with  ? Medication Refill  ? ? ?HPI ?[name] is a 66 y.o. female.  ? ?Patient presents for refill of blood pressure medication.  She reports 3 days ago, she ran out of lisinopril-hydrochlorothiazide 20-25.  She reports prior to this, she was taking it daily and her blood pressure is well controlled.  She denies any current chest pain, shortness of breath, dizziness, lightheadedness, swelling in her legs, vision changes.  She reports she is looking for a new primary care provider and is requesting a refill today. ? ?She also has a swollen lymph node in the left side of her neck that she is worried about.  She denies any recent fever, cough, congestion.  She does report she recently traveled to [place] and back and is under a lot of stress. ? ? ?Past Medical History:  ?Diagnosis Date  ? Anemia   ? history  ? Anxiety   ? Arthritis   ? mild RA, secondary to Sjogren's   ? Complication of anesthesia   ? Depression   ? history of, treated with med. & therapy  ? Fibromyalgia   ? GERD (gastroesophageal reflux disease)   ? Heart murmur 2010  ? told by Dr. Birdie Riddle - seen perhaps on ECHO?  ? Hiatal hernia   ? History of kidney stones   ? passed spontaneously  ? Hyperlipidemia   ? Hypertension   ? PONV (postoperative nausea and vomiting)   ? mild nausea post op  ? Rheumatoid arthritis(714.0)   ? Sjogren's syndrome ([place])   ? ? ?Patient Active Problem List  ? Diagnosis Date Noted  ? Status post surgery 11/02/2017  ? Cervical radiculopathy 08/25/2016  ? Cervicalgia 03/22/2016  ? Chronic left-sided low back pain without sciatica 03/22/2016  ? Acute midline low back pain without sciatica 02/29/2016  ? Cough 07/18/2011  ? GERD (gastroesophageal reflux disease) 06/07/2011  ? Sinusitis acute 06/07/2011  ? Epigastric pain 04/13/2011  ? Conjunctivitis 04/05/2011  ?  Costochondritis 03/30/2011  ? LYMPHADENOPATHY 03/24/2010  ? CARDIAC MURMUR 01/29/2010  ? CHEST PAIN UNSPECIFIED 01/29/2010  ? SHINGLES 10/05/2009  ? DYSPHAGIA 07/27/2009  ? FATTY LIVER DISEASE 07/21/2009  ? HYPERLIPIDEMIA 07/17/2009  ? ANXIETY 07/17/2009  ? HYPERTENSION 07/17/2009  ? HIATAL HERNIA 07/17/2009  ? [place] SYNDROME 07/17/2009  ? ARTHRITIS, RHEUMATOID 07/17/2009  ? ANOSMIA 07/17/2009  ? COMPRESSION FRACTURE, SPINE 07/17/2009  ? ? ?Past Surgical History:  ?Procedure Laterality Date  ? ANTERIOR CERVICAL DECOMP/DISCECTOMY FUSION N/A 11/02/2017  ? Procedure: ANTERIOR CERVICAL DECOMPRESSION FUSION, CERVICAL 6-7 WITH INSTRUMENTATION AND ALLOGRAFT;  Surgeon: Phylliss Bob, MD;  Location: [facility];  Service: Orthopedics;  Laterality: N/A;  ? OOPHORECTOMY    ? 1 ovary remain, removed adhesions on bowel  ? REDUCTION MAMMAPLASTY Bilateral 11/2009  ? TONSILLECTOMY    ? born without tonsils   ? VESICOVAGINAL FISTULA CLOSURE W/ TAH    ? ? ?OB History   ?No obstetric history on file. ?  ? ? ? ?Home Medications   ? ?Prior to Admission medications   ?Medication Sig Start Date End Date Taking? Authorizing Provider  ?diphenhydrAMINE (BENADRYL) 25 MG tablet Take 25 mg by mouth daily as needed for allergies or sleep.    [provider]  ?esomeprazole (NEXIUM) 40 MG capsule Take 40 mg by mouth daily. 08/28/17  [provider]  ?lisinopril-hydrochlorothiazide (ZESTORETIC) 20-25 MG tablet Take 1 tablet by mouth daily. 05/26/21   Eulogio Bear, NP  ? ? ?Family History ?Family History  ?Problem Relation Age of Onset  ? Coronary artery disease Mother   ? Hypertension Mother   ? Diabetes Mother   ? Heart attack Father   ? Hypertension Father   ? Stroke Maternal Grandmother   ? Heart attack Paternal Grandmother   ? Breast cancer Maternal Aunt   ? ? ?Social History ?Social History  ? ?Tobacco Use  ? Smoking status: Never  ? Smokeless tobacco: Never  ?Substance Use Topics  ? Alcohol use: No  ? Drug use: Never   ? ? ? ?Allergies   ?Amitriptyline, Latex, and Lipitor [atorvastatin] ? ? ?Review of Systems ?Review of Systems ?Per HPI ? ?Physical Exam ?Triage Vital Signs ?ED Triage Vitals [05/26/21 1911]  ?Enc Vitals Group  ?   BP (!) 183/81  ?   Pulse Rate 73  ?   Resp 18  ?   Temp 97.9 ?F (36.6 ?C)  ?   Temp Source Oral  ?   SpO2 98 %  ?   Weight   ?   Height   ?   Head Circumference   ?   Peak Flow   ?   Pain Score 0  ?   Pain Loc   ?   Pain Edu?   ?   Excl. in [place]?   ? ?No data found. ? ?Updated Vital Signs ?BP (!) 183/81 (BP Location: Right Arm) Comment: talking throughout reading  Pulse 73   Temp 97.9 ?F (36.6 ?C) (Oral)   Resp 18   SpO2 98%  ? ?Visual Acuity ?Right Eye Distance:   ?Left Eye Distance:   ?Bilateral Distance:   ? ?Right Eye Near:   ?Left Eye Near:    ?Bilateral Near:    ? ?Physical Exam ?Vitals and nursing note reviewed.  ?Constitutional:   ?   General: She is not in acute distress. ?   Appearance: Normal appearance. She is not toxic-appearing.  ?HENT:  ?   Nose: Nose normal. No congestion.  ?   Mouth/Throat:  ?   Mouth: Mucous membranes are moist.  ?   Pharynx: Oropharynx is clear.  ?Eyes:  ?   General: No scleral icterus. ?   Extraocular Movements: Extraocular movements intact.  ?Cardiovascular:  ?   Rate and Rhythm: Normal rate and regular rhythm.  ?Pulmonary:  ?   Effort: Pulmonary effort is normal. No respiratory distress.  ?   Breath sounds: Normal breath sounds. No wheezing, rhonchi or rales.  ?Musculoskeletal:  ?   Right lower leg: No edema.  ?   Left lower leg: No edema.  ?Lymphadenopathy:  ?   Cervical: Cervical adenopathy present.  ?Skin: ?   General: Skin is warm and dry.  ?   Capillary Refill: Capillary refill takes less than 2 seconds.  ?   Coloration: Skin is not jaundiced or pale.  ?   Findings: No erythema.  ?Neurological:  ?   Mental Status: She is alert and oriented to person, place, and time.  ?   Motor: No weakness.  ?   Gait: Gait normal.  ?Psychiatric:     ?   Behavior: Behavior is  cooperative.  ? ? ? ?UC Treatments / Results  ?Labs ?(all labs ordered are listed, but only abnormal results are displayed) ?Labs Reviewed  ?CBC WITH DIFFERENTIAL/PLATELET  ?BASIC  METABOLIC PANEL  ? ? ?EKG ? ? ?Radiology ?No results found. ? ?Procedures ?Procedures (including critical care time) ? ?Medications Ordered in UC ?Medications - No data to display ? ?Initial Impression / Assessment and Plan / UC Course  ?I have reviewed the triage vital signs and the nursing notes. ? ?Pertinent labs & imaging results that were available during my care of the patient were reviewed by me and considered in my medical decision making (see chart for details). ? ?  ?We will give refill of lisinopril-hydrochlorothiazide 20-25 today for blood pressure.  Check kidney function with electrolytes today and will notify with abnormal results.  Given cervical lymphadenopathy, this is likely reactive.  However, given her concern, we will check blood counts.  I explained to her that this is likely reactive.  She can continue warm compresses and continue to avoid touching it.  If this persists or worsens, return to urgent care.  Encouraged establishing care with primary care provider -she declines primary care assistance because she would like to research on her own. ?Final Clinical Impressions(s) / UC Diagnoses  ? ?Final diagnoses:  ?Essential hypertension  ?Lymphadenopathy, cervical  ?Medication refill  ? ? ? ?Discharge Instructions   ? ?  ?- Please resume the lisinopril-hydrochlorothiazide for blood pressure. ?-We will call you tomorrow if any of the blood work comes back abnormal ?-The swelling in your neck is likely reactive lymphadenopathy which can be present for multiple reasons including acute viral illness ? ? ? ?ED Prescriptions   ? ? Medication Sig Dispense Auth. Provider  ? lisinopril-hydrochlorothiazide (ZESTORETIC) 20-25 MG tablet Take 1 tablet by mouth daily. 90 tablet Eulogio Bear, NP  ? ?  ? ?PDMP not reviewed this  encounter. ?  ?Eulogio Bear, NP ?05/26/21 2048 ? ?

## 2021-05-26 NOTE — Discharge Instructions (Addendum)
-   Please resume the lisinopril-hydrochlorothiazide for blood pressure. ?-We will call you tomorrow if any of the blood work comes back abnormal ?-The swelling in your neck is likely reactive lymphadenopathy which can be present for multiple reasons including acute viral illness ?

## 2021-05-26 NOTE — ED Triage Notes (Signed)
Pt reports that she is in between PCP right now and needs a BP med refill.  ?

## 2021-07-01 ENCOUNTER — Encounter: Payer: Self-pay | Admitting: Physician Assistant

## 2021-07-01 ENCOUNTER — Ambulatory Visit: Payer: 59 | Admitting: Physician Assistant

## 2021-07-01 VITALS — BP 132/82 | HR 87 | Temp 97.9°F | Ht 64.0 in | Wt 187.4 lb

## 2021-07-01 DIAGNOSIS — R252 Cramp and spasm: Secondary | ICD-10-CM

## 2021-07-01 DIAGNOSIS — I700 Atherosclerosis of aorta: Secondary | ICD-10-CM

## 2021-07-01 DIAGNOSIS — D649 Anemia, unspecified: Secondary | ICD-10-CM

## 2021-07-01 DIAGNOSIS — R5383 Other fatigue: Secondary | ICD-10-CM

## 2021-07-01 DIAGNOSIS — Z131 Encounter for screening for diabetes mellitus: Secondary | ICD-10-CM | POA: Diagnosis not present

## 2021-07-01 DIAGNOSIS — R079 Chest pain, unspecified: Secondary | ICD-10-CM

## 2021-07-01 DIAGNOSIS — M3500 Sicca syndrome, unspecified: Secondary | ICD-10-CM

## 2021-07-01 LAB — COMPREHENSIVE METABOLIC PANEL
ALT: 38 U/L — ABNORMAL HIGH (ref 0–35)
AST: 55 U/L — ABNORMAL HIGH (ref 0–37)
Albumin: 3.8 g/dL (ref 3.5–5.2)
Alkaline Phosphatase: 76 U/L (ref 39–117)
BUN: 15 mg/dL (ref 6–23)
CO2: 28 mEq/L (ref 19–32)
Calcium: 9.8 mg/dL (ref 8.4–10.5)
Chloride: 100 mEq/L (ref 96–112)
Creatinine, Ser: 0.81 mg/dL (ref 0.40–1.20)
GFR: 76.94 mL/min (ref >60.00)
Glucose, Bld: 76 mg/dL (ref 70–99)
Potassium: 4 mEq/L (ref 3.5–5.1)
Sodium: 134 mEq/L — ABNORMAL LOW (ref 135–145)
Total Bilirubin: 0.3 mg/dL (ref 0.2–1.2)
Total Protein: 9.3 g/dL — ABNORMAL HIGH (ref 6.0–8.3)

## 2021-07-01 LAB — CBC WITH DIFFERENTIAL/PLATELET
Basophils Absolute: 0 10*3/uL (ref 0.0–0.1)
Basophils Relative: 0.8 % (ref 0.0–3.0)
Eosinophils Absolute: 0 10*3/uL (ref 0.0–0.7)
Eosinophils Relative: 0.8 % (ref 0.0–5.0)
HCT: 33.8 % — ABNORMAL LOW (ref 36.0–46.0)
Hemoglobin: 10.9 g/dL — ABNORMAL LOW (ref 12.0–15.0)
Lymphocytes Relative: 19 % (ref 12.0–46.0)
Lymphs Abs: 1.1 10*3/uL (ref 0.7–4.0)
MCHC: 32.1 g/dL (ref 30.0–36.0)
MCV: 83 fl (ref 78.0–100.0)
Monocytes Absolute: 1 10*3/uL (ref 0.1–1.0)
Monocytes Relative: 17.1 % — ABNORMAL HIGH (ref 3.0–12.0)
Neutro Abs: 3.8 10*3/uL (ref 1.4–7.7)
Neutrophils Relative %: 62.3 % (ref 43.0–77.0)
Platelets: 238 10*3/uL (ref 150.0–400.0)
RBC: 4.07 Mil/uL (ref 3.87–5.11)
RDW: 14 % (ref 11.5–15.5)
WBC: 6 10*3/uL (ref 4.0–10.5)

## 2021-07-01 LAB — HEMOGLOBIN A1C: Hgb A1c MFr Bld: 6.2 % (ref 4.6–6.5)

## 2021-07-01 LAB — TSH: TSH: 1.62 u[IU]/mL (ref 0.35–5.50)

## 2021-07-01 LAB — LIPID PANEL
Cholesterol: 189 mg/dL (ref 0–200)
HDL: 57.4 mg/dL (ref >39.00)
LDL Cholesterol: 118 mg/dL — ABNORMAL HIGH (ref 0–99)
NonHDL: 132.07
Total CHOL/HDL Ratio: 3
Triglycerides: 69 mg/dL (ref 0.0–149.0)
VLDL: 13.8 mg/dL (ref 0.0–40.0)

## 2021-07-01 LAB — MAGNESIUM: Magnesium: 2.1 mg/dL (ref 1.5–2.5)

## 2021-07-01 NOTE — Patient Instructions (Signed)
Welcome to [facility] at [facility]! It was a pleasure meeting you today.  As discussed, Please schedule a 3 month follow up visit today.  PLEASE NOTE:  If you had any LAB tests please let us know if you have not heard back within a few days. You may see your results on MyChart before we have a chance to review them but we will give you a call once they are reviewed by us. If we ordered any REFERRALS today, please let us know if you have not heard from their office within the next two weeks. Let us know through MyChart if you are needing REFILLS, or have your pharmacy send us the request. You can also use MyChart to communicate with me or any office staff.  Please try these tips to maintain a healthy lifestyle:  Eat most of your calories during the day when you are active. Eliminate processed foods including packaged sweets (pies, cakes, cookies), reduce intake of potatoes, white bread, white pasta, and white rice. Look for whole grain options, oat flour or almond flour.  Each meal should contain half fruits/vegetables, one quarter protein, and one quarter carbs (no bigger than a computer mouse).  Cut down on sweet beverages. This includes juice, soda, and sweet tea. Also watch fruit intake, though this is a healthier sweet option, it still contains natural sugar! Limit to 3 servings daily.  Drink at least 1 glass of water with each meal and aim for at least 8 glasses (64 ounces) per day.  Exercise at least 150 minutes every week to the best of your ability.    Take Care,  Estrellita Lasky, PA-C    

## 2021-07-01 NOTE — Progress Notes (Unsigned)
Subjective:    Patient ID: [name], female    DOB: 08-09-1955, 66 y.o.   MRN: [id number]  Chief Complaint  Patient presents with   [facility] pt coming in to establish care with PCP; needs annual CPE and will schedule CPE today and come back for fasting labs; pt states needs heart checked has leaky valve and slightly enlarged left ventricle, Fibromyalgia; pt thinks she may have blockage, her legs get cramps and feels pain all the way up groin, legs feeling like dead weight; pt wants referral for Colonoscopy; pt states she just knows something is off with her and how she feels    HPI 66 y.o. patient presents today for new patient establishment with me.  Patient was previously established with [facility]. Milk, tumeric, honey, [place]   Current Care Team: Previously saw rheum, needs new referral  Acute Concerns: Wants to get her heart checked - not sure if nerves or cardiac Chest pain / discomfort sometimes waking her up at night - last happened on 06/26/21 - left chest pain, went into her back  -Walking / treadmill daily - doesn't feel any symptoms with walking, just heaviness in legs  Cramps / drawing up in her feet and legs at night Sometimes shooting pain going up back of left leg into buttock and groin  Doesn't sleep well  Feeling tired, feeling like legs are heavy  Just feels like "something is off" about 6 months ago   Chronic Concerns: See PMH listed below, as well as A/P for details on issues we specifically discussed during today's visit.      Past Medical History:  Diagnosis Date   Anemia    history   Anxiety    Arthritis    mild RA, secondary to Sjogren's    Complication of anesthesia    Depression    history of, treated with med. & therapy   Fibromyalgia    GERD (gastroesophageal reflux disease)    Heart murmur 2010   told by Dr. Birdie Riddle - seen perhaps on ECHO?   Hiatal hernia    History of kidney stones    passed  spontaneously   Hyperlipidemia    Hypertension    PONV (postoperative nausea and vomiting)    mild nausea post op   Rheumatoid arthritis(714.0)    Sjogren's syndrome ([facility])     Past Surgical History:  Procedure Laterality Date   ANTERIOR CERVICAL DECOMP/DISCECTOMY FUSION N/A 11/02/2017   Procedure: ANTERIOR CERVICAL DECOMPRESSION FUSION, CERVICAL 6-7 WITH INSTRUMENTATION AND ALLOGRAFT;  Surgeon: Phylliss Bob, MD;  Location: [facility];  Service: Orthopedics;  Laterality: N/A;   OOPHORECTOMY     1 ovary remain, removed adhesions on bowel   REDUCTION MAMMAPLASTY Bilateral 11/2009   TONSILLECTOMY     born without tonsils    VESICOVAGINAL FISTULA CLOSURE W/ TAH      Family History  Problem Relation Age of Onset   Coronary artery disease Mother    Hypertension Mother    Diabetes Mother    Heart attack Father    Hypertension Father    Stroke Maternal Grandmother    Heart attack Paternal Grandmother    Breast cancer Maternal Aunt     Social History   Tobacco Use   Smoking status: Never   Smokeless tobacco: Never  Substance Use Topics   Alcohol use: No   Drug use: Never     Allergies  Allergen Reactions   Amitriptyline Swelling  Leg swelling    Latex Other (See Comments)    Burns skin   Lipitor [Atorvastatin] Itching    Review of Systems NEGATIVE UNLESS OTHERWISE INDICATED IN HPI      Objective:     BP 132/82 (BP Location: Left Arm)   Pulse 87   Temp 97.9 F (36.6 C) (Temporal)   Ht '5\' 4"'$  (1.626 m)   Wt 187 lb 6.4 oz (85 kg)   SpO2 99%   BMI 32.17 kg/m   Wt Readings from Last 3 Encounters:  07/01/21 187 lb 6.4 oz (85 kg)  11/02/17 181 lb (82.1 kg)  11/01/17 181 lb (82.1 kg)    BP Readings from Last 3 Encounters:  07/01/21 132/82  05/26/21 (!) 183/81  03/24/20 132/64     Physical Exam Vitals and nursing note reviewed.  Constitutional:      Appearance: Normal appearance. She is normal weight. She is not toxic-appearing.  HENT:     Head:  Normocephalic and atraumatic.     Right Ear: Tympanic membrane, ear canal and external ear normal.     Left Ear: Tympanic membrane, ear canal and external ear normal.     Nose: Nose normal.     Mouth/Throat:     Mouth: Mucous membranes are moist.  Eyes:     Extraocular Movements: Extraocular movements intact.     Conjunctiva/sclera: Conjunctivae normal.     Pupils: Pupils are equal, round, and reactive to light.  Cardiovascular:     Rate and Rhythm: Normal rate and regular rhythm.     Pulses: Normal pulses.     Heart sounds: Normal heart sounds.  Pulmonary:     Effort: Pulmonary effort is normal.     Breath sounds: Normal breath sounds.  Abdominal:     General: Abdomen is flat. Bowel sounds are normal.     Palpations: Abdomen is soft.  Musculoskeletal:        General: Normal range of motion.     Cervical back: Normal range of motion and neck supple.  Skin:    General: Skin is warm and dry.  Neurological:     General: No focal deficit present.     Mental Status: She is alert and oriented to person, place, and time.  Psychiatric:        Mood and Affect: Mood normal.        Behavior: Behavior normal.        Thought Content: Thought content normal.        Judgment: Judgment normal.        Assessment & Plan:   Problem List Items Addressed This Visit   None    No orders of the defined types were placed in this encounter.    No follow-ups on file.  This note was prepared with assistance of Systems analyst. Occasional wrong-word or sound-a-like substitutions may have occurred due to the inherent limitations of voice recognition software.  Time Spent: *** minutes of total time was spent on the date of the encounter performing the following actions: chart review prior to seeing the patient, obtaining history, performing a medically necessary exam, counseling on the treatment plan, placing orders, and documenting in our EHR.       Lycan Davee M Joy Reiger,  PA-C

## 2021-07-02 LAB — IRON,TIBC AND FERRITIN PANEL
%SAT: 13 % (calc) — ABNORMAL LOW (ref 16–45)
Ferritin: 47 ng/mL (ref 16–288)
Iron: 54 ug/dL (ref 45–160)
TIBC: 428 mcg/dL (calc) (ref 250–450)

## 2021-07-06 ENCOUNTER — Ambulatory Visit (HOSPITAL_BASED_OUTPATIENT_CLINIC_OR_DEPARTMENT_OTHER): Payer: 59 | Admitting: Nurse Practitioner

## 2021-07-06 ENCOUNTER — Ambulatory Visit: Payer: 59 | Admitting: Internal Medicine

## 2021-07-06 ENCOUNTER — Encounter: Payer: Self-pay | Admitting: Internal Medicine

## 2021-07-06 VITALS — BP 120/68 | HR 89 | Ht 64.0 in | Wt 186.0 lb

## 2021-07-06 DIAGNOSIS — R079 Chest pain, unspecified: Secondary | ICD-10-CM

## 2021-07-06 DIAGNOSIS — R0602 Shortness of breath: Secondary | ICD-10-CM | POA: Diagnosis not present

## 2021-07-06 DIAGNOSIS — R0789 Other chest pain: Secondary | ICD-10-CM

## 2021-07-06 MED ORDER — METOPROLOL TARTRATE 100 MG PO TABS: 100.0000 mg | ORAL_TABLET | Freq: Once | ORAL | 0 refills | Status: DC

## 2021-07-06 NOTE — Progress Notes (Signed)
Cardiology Office Note   Date:  07/06/2021   ID:  [name], [name] May 25, 1955, MRN [id number]  [facility]P:  [facility]  Cardiologist:   Dorris Carnes, MD   Patient referred for chest presure and SOB      History of Present Illness: [name] is a 66 y.o. female with a history of HTN, GERD, murmur, HL, RA.   Seen in ED in May 2023  BP 180s/  Restarted on lisinopril / HCTZ    (run out)  Pt has had episodes of CP   One bad spell while in bed watching TV   Pain in mid chest.  Radiated to back   St up coughing   Went away A few days ago had chest pressure   Dull   Lasted a minute   Doing mental work at the time      Pt does get some SOB with walking   Feels tired, exhausted all the time    Legs feel heavy at times     In ER visit in 2022 found aortic calcifications in CT     Current Meds  Medication Sig   Ibuprofen 200 MG CAPS Take by mouth as needed (pain).   lisinopril-hydrochlorothiazide (ZESTORETIC) 20-25 MG tablet Take 1 tablet by mouth daily.   pantoprazole (PROTONIX) 40 MG tablet as needed for heartburn.   [DISCONTINUED] esomeprazole (NEXIUM) 40 MG capsule Take 40 mg by mouth daily.     Allergies:   Amitriptyline, Latex, and Lipitor [atorvastatin]   Past Medical History:  Diagnosis Date   Anemia    history   Anxiety    Arthritis    mild RA, secondary to Sjogren's    Complication of anesthesia    Depression    history of, treated with med. & therapy   Fibromyalgia    GERD (gastroesophageal reflux disease)    Heart murmur 2010   told by Dr. Birdie Riddle - seen perhaps on ECHO?   Hiatal hernia    History of kidney stones    passed spontaneously   Hyperlipidemia    Hypertension    PONV (postoperative nausea and vomiting)    mild nausea post op   Rheumatoid arthritis(714.0)    Sjogren's syndrome (HCC)     Past Surgical History:  Procedure Laterality Date   ANTERIOR CERVICAL DECOMP/DISCECTOMY FUSION N/A 11/02/2017   Procedure: ANTERIOR CERVICAL  DECOMPRESSION FUSION, CERVICAL 6-7 WITH INSTRUMENTATION AND ALLOGRAFT;  Surgeon: Phylliss Bob, MD;  Location: [facility];  Service: Orthopedics;  Laterality: N/A;   OOPHORECTOMY     1 ovary remain, removed adhesions on bowel   REDUCTION MAMMAPLASTY Bilateral 11/2009   TONSILLECTOMY     born without tonsils    VESICOVAGINAL FISTULA CLOSURE W/ TAH       Social History:  The patient  reports that she has never smoked. She has never used smokeless tobacco. She reports that she does not drink alcohol and does not use drugs.   Family History:  The patient's family history includes Breast cancer in her maternal aunt; CVA in her father; Coronary artery disease in her mother; Diabetes in her mother; Heart attack in her father and paternal grandmother; Hypertension in her father and mother; Stroke in her maternal grandmother.    ROS:  Please see the history of present illness. All other systems are reviewed and  Negative to the above problem except as noted.    PHYSICAL EXAM: VS:  BP 120/68   Pulse 89  Ht '5\' 4"'$  (1.626 m)   Wt 186 lb (84.4 kg)   SpO2 99%   BMI 31.93 kg/m   GEN:  Obese 66 yo in no acute distress  HEENT: normal  Neck: no JVD, carotid bruits Cardiac: RRR  Gr I/VI systolic murmur LSB  No LE edema  Respiratory:  clear to auscultation bilaterally, GI: soft, nontender, nondistended, + BS  No hepatomegaly  MS: no deformity Moving all extremities   Skin: warm and dry, no rash Neuro:  Strength and sensation are intact Psych: euthymic mood, full affect   EKG:  EKG is not ordered today.  On 07/01/21  SR 68 bpm  T wave inv V1, V2    Lipid Panel    Component Value Date/Time   CHOL 189 07/01/2021 1339   TRIG 69.0 07/01/2021 1339   HDL 57.40 07/01/2021 1339   CHOLHDL 3 07/01/2021 1339   VLDL 13.8 07/01/2021 1339   LDLCALC 118 (H) 07/01/2021 1339   LDLDIRECT 162.7 03/24/2010 0921      Wt Readings from Last 3 Encounters:  07/06/21 186 lb (84.4 kg)  07/01/21 187 lb 6.4 oz (85  kg)  11/02/17 181 lb (82.1 kg)      ASSESSMENT AND PLAN:  1  Chest  pain/ pressure /discomfort   Episode in bed very  atypcial   Does not sound cardiac     CT of abdomen in 2022 shows moderate hiatal hernia. This may be the cause with reflux   Other spells of CP also atypcial    With DOE and coronary calcifications on CT will get CT coronary angiogram   2  DOE This is more concerning than CP    Will plan on CT angiogram    Also set up for echo to evaluate systolic and diastolic properties of heart  3  Atheroscleriss   Noted on coronaries and aorta     4  Lipids   Will need Rx for lipoids   Pt wants to wait until after tests back     F/U based on test results   Current medicines are reviewed at length with the patient today.  The patient does not have concerns regarding medicines.  Signed, Dorris Carnes, MD  07/06/2021 3:34 PM    [address], [place], [place]  [address] Phone: [phone]; Fax: [phone]

## 2021-07-06 NOTE — Patient Instructions (Addendum)
Medication Instructions:  Your physician recommends that you continue on your current medications as directed. Please refer to the Current Medication list given to you today. *If you need a refill on your cardiac medications before your next appointment, please call your pharmacy*   Lab Work: None ordered If you have labs (blood work) drawn today and your tests are completely normal, you will receive your results only by: [place] (if you have MyChart) OR A paper copy in the mail If you have any lab test that is abnormal or we need to change your treatment, we will call you to review the results.   Testing/Procedures: Your physician has requested that you have an echocardiogram. Echocardiography is a painless test that uses sound waves to create images of your heart. It provides your doctor with information about the size and shape of your heart and how well your heart's chambers and valves are working. This procedure takes approximately one hour. There are no restrictions for this procedure.  See instructions below for CT coronary    Follow-Up: At [facility], you and your health needs are our priority.  As part of our continuing mission to provide you with exceptional heart care, we have created designated Provider Care Teams.  These Care Teams include your primary Cardiologist (physician) and Advanced Practice Providers (APPs -  Physician Assistants and Nurse Practitioners) who all work together to provide you with the care you need, when you need it.  We recommend signing up for the patient portal called "MyChart".  Sign up information is provided on this After Visit Summary.  MyChart is used to connect with patients for Virtual Visits (Telemedicine).  Patients are able to view lab/test results, encounter notes, upcoming appointments, etc.  Non-urgent messages can be sent to your provider as well.   To learn more about what you can do with MyChart, go to [email or web address].     Your next appointment:   TBD  Pending your Test Results  The format for your next appointment:   In Person  Provider:   Dr Dorris Carnes    Other Instructions   Your cardiac CT will be scheduled at one of the below locations:   [facility] [address] [place], [place] [address] [id number]   If scheduled at [facility], please arrive at the [facility] and Children's Entrance (Entrance C2) of [facility] 30 minutes prior to test start time. You can use the FREE valet parking offered at entrance C (encouraged to control the heart rate for the test)  Proceed to the [facility] Radiology Department (first floor) to check-in and test prep.  All radiology patients and guests should use entrance C2 at [facility], accessed from [facility], even though the hospital's physical address listed is [address].     Please follow these instructions carefully (unless otherwise directed):  Hold all erectile dysfunction medications at least 3 days (72 hrs) prior to test.  On the Night Before the Test: Be sure to Drink plenty of water. Do not consume any caffeinated/decaffeinated beverages or chocolate 12 hours prior to your test. Do not take any antihistamines 12 hours prior to your test.  On the Day of the Test: Drink plenty of water until 1 hour prior to the test. Do not eat any food 4 hours prior to the test. You may take your regular medications prior to the test.  Take metoprolol (Lopressor) two hours prior to test. HOLD  Furosemide/Hydrochlorothiazide morning of the test. FEMALES- please wear underwire-free bra if available, avoid dresses & tight clothing   *For Clinical Staff only. Please instruct patient the following:* Heart Rate Medication Recommendations for Cardiac CT  Resting HR < 50 bpm  No medication  Resting HR 50-60 bpm and BP >110/50 mmHG   Consider Metoprolol tartrate 25 mg PO 90-120 min prior to scan   Resting HR 60-65 bpm and BP >110/50 mmHG  Metoprolol tartrate 50 mg PO 90-120 minutes prior to scan   Resting HR > 65 bpm and BP >110/50 mmHG  Metoprolol tartrate 100 mg PO 90-120 minutes prior to scan  Consider Ivabradine 10-15 mg PO or a calcium channel blocker for resting HR >60 bpm and contraindication to metoprolol tartrate  Consider Ivabradine 10-15 mg PO in combination with metoprolol tartrate for HR >80 bpm         After the Test: Drink plenty of water. After receiving IV contrast, you may experience a mild flushed feeling. This is normal. On occasion, you may experience a mild rash up to 24 hours after the test. This is not dangerous. If this occurs, you can take Benadryl 25 mg and increase your fluid intake. If you experience trouble breathing, this can be serious. If it is severe call 911 IMMEDIATELY. If it is mild, please call our office. If you take any of these medications: Glipizide/Metformin, Avandament, Glucavance, please do not take 48 hours after completing test unless otherwise instructed.  We will call to schedule your test 2-4 weeks out understanding that some insurance companies will need an authorization prior to the service being performed.   For non-scheduling related questions, please contact the cardiac imaging nurse navigator should you have any questions/concerns: Marchia Bond, Cardiac Imaging Nurse Navigator Gordy Clement, Cardiac Imaging Nurse Navigator [facility] Direct Office Dial: [phone]   For scheduling needs, including cancellations and rescheduling, please call [place], [phone].   Important Information About Sugar

## 2021-07-08 ENCOUNTER — Ambulatory Visit: Payer: Self-pay | Admitting: Family

## 2021-07-13 ENCOUNTER — Telehealth (HOSPITAL_COMMUNITY): Payer: Self-pay | Admitting: Emergency Medicine

## 2021-07-14 ENCOUNTER — Ambulatory Visit (HOSPITAL_COMMUNITY): Admission: RE | Admit: 2021-07-14 | Payer: 59 | Source: Ambulatory Visit

## 2021-07-21 ENCOUNTER — Ambulatory Visit (HOSPITAL_COMMUNITY): Payer: 59 | Attending: Cardiology

## 2021-07-21 DIAGNOSIS — R0789 Other chest pain: Secondary | ICD-10-CM | POA: Diagnosis present

## 2021-07-21 DIAGNOSIS — R0602 Shortness of breath: Secondary | ICD-10-CM | POA: Insufficient documentation

## 2021-07-21 LAB — ECHOCARDIOGRAM COMPLETE
Area-P 1/2: 4.49 cm2
S' Lateral: 1.9 cm

## 2021-07-30 ENCOUNTER — Telehealth: Payer: Self-pay | Admitting: Internal Medicine

## 2021-07-30 NOTE — Telephone Encounter (Signed)
Patient was returning call. Please advise ?

## 2021-08-02 ENCOUNTER — Telehealth (HOSPITAL_COMMUNITY): Payer: Self-pay | Admitting: *Deleted

## 2021-08-02 NOTE — Telephone Encounter (Signed)
Reaching out to patient to offer assistance regarding upcoming cardiac imaging study; pt verbalizes understanding of appt date/time, parking situation and where to check in, pre-test NPO status and medications ordered, and verified current allergies; name and call back number provided for further questions should they arise  Nazaria Riesen RN Navigator Cardiac Imaging [facility] [phone] office [phone] cell  Patient to take 100mg metoprolol tartrate two hours prior to her cardiac CT scan. She is aware to arrive at 8:30am. 

## 2021-08-03 ENCOUNTER — Telehealth: Payer: Self-pay | Admitting: Physician Assistant

## 2021-08-03 ENCOUNTER — Other Ambulatory Visit: Payer: Self-pay | Admitting: Cardiovascular Disease

## 2021-08-03 ENCOUNTER — Ambulatory Visit (HOSPITAL_COMMUNITY)
Admission: RE | Admit: 2021-08-03 | Discharge: 2021-08-03 | Disposition: A | Discharge status: Home / Self Care | Payer: 59 | Source: Ambulatory Visit | Attending: Cardiovascular Disease | Admitting: Cardiovascular Disease

## 2021-08-03 ENCOUNTER — Ambulatory Visit (HOSPITAL_COMMUNITY)
Admission: RE | Admit: 2021-08-03 | Discharge: 2021-08-03 | Disposition: A | Discharge status: Home / Self Care | Payer: 59 | Source: Ambulatory Visit | Attending: Internal Medicine | Admitting: Internal Medicine

## 2021-08-03 DIAGNOSIS — R931 Abnormal findings on diagnostic imaging of heart and coronary circulation: Secondary | ICD-10-CM

## 2021-08-03 DIAGNOSIS — R0602 Shortness of breath: Secondary | ICD-10-CM | POA: Diagnosis present

## 2021-08-03 DIAGNOSIS — I2510 Atherosclerotic heart disease of native coronary artery without angina pectoris: Secondary | ICD-10-CM

## 2021-08-03 DIAGNOSIS — R0789 Other chest pain: Secondary | ICD-10-CM | POA: Diagnosis present

## 2021-08-03 DIAGNOSIS — R079 Chest pain, unspecified: Secondary | ICD-10-CM | POA: Diagnosis present

## 2021-08-03 MED ORDER — NITROGLYCERIN 0.4 MG SL SUBL
SUBLINGUAL_TABLET | SUBLINGUAL | Status: AC
  Filled 2021-08-03: qty 2

## 2021-08-03 MED ORDER — IOHEXOL 350 MG/ML SOLN
100.0000 mL | Freq: Once | INTRAVENOUS | Status: AC | PRN
Start: 2021-08-03 — End: 2021-08-03
  Administered 2021-08-03: 100 mL via INTRAVENOUS

## 2021-08-03 MED ORDER — NITROGLYCERIN 0.4 MG SL SUBL
0.8000 mg | SUBLINGUAL_TABLET | Freq: Once | SUBLINGUAL | Status: AC
Start: 2021-08-03 — End: 2021-08-03
  Administered 2021-08-03: 0.8 mg via SUBLINGUAL

## 2021-08-03 NOTE — Telephone Encounter (Signed)
States she would like to discuss CT results from Cardiology with [place]ward[facility] assistant.   Please follow up in regard.

## 2021-08-03 NOTE — Telephone Encounter (Signed)
Patient needs to schedule an appt with Alyssa to discuss imaging results

## 2021-08-04 NOTE — Telephone Encounter (Signed)
Vml for patient to call back and sch fu with alyssa for imaging review -

## 2021-08-05 ENCOUNTER — Telehealth: Payer: Self-pay

## 2021-08-05 NOTE — Telephone Encounter (Signed)
-----   Message from Fay Records, MD sent at 08/05/2021 12:32 PM EDT ----- Regarding: FW: Tried to call pt.  Left VM that someone would call.   Pt has CAD on CT.   Some distal narrowings that appear tight.   Given her symptoms I would recommend a left heart Cath to define further.   May need intervention.     I am happy to review with her when she is available.    May need preprocedue labs. ----- Message ----- From: Interface, Rad Results In Sent: 08/03/2021   9:56 AM EDT To: Fay Records, MD

## 2021-08-05 NOTE — Telephone Encounter (Signed)
Per Dr Harrington Challenger:   [name] to call pt.  Left VM that someone would call.   Pt has CAD on CT.   Some distal narrowings that appear tight.   Given her symptoms I would recommend a left heart Cath to define further. May need intervention.  I am happy to review with her when she is available. May need preprocedue labs.    I spoke with the pt and advised her Dr Harrington Challenger' recommendations based on her Cardiac CT results and she agreed and verbalized understanding... she says she ha had a cath before.   I made her an appt for updated H&P. Labs, and consent/ orders for 08/09/21 with Melina Copa PA and to answer any of her questions.   Her Cath is on for 08/12/21 (date is per pt request) with Dr Tamala Julian for 08/12/21 at 9 am.... 7 am arrival.   Letter in Epic sent to her My Chart. Labs will be ordered day of appt with Dayna.

## 2021-08-08 ENCOUNTER — Encounter: Payer: Self-pay | Admitting: Physician Assistant

## 2021-08-08 NOTE — H&P (View-Only) (Signed)
Cardiology Office Note    Date:  08/09/2021   ID:  [name], DOB 1955/09/08, MRN [id number]  PCP:  Fredirick Lathe, PA-C  Cardiologist:  Dorris Carnes, MD  Electrophysiologist:  None   Chief Complaint: discuss cath  History of Present Illness:   [name] is a 66 y.o. female with history of HTN, GERD, HLD, RA, anxiety, anemia, fibromyalgia, heart murmur, Sjogren's syndrome, hiatal hernia, aortic atherosclerosis and CAD demonstrated by recent CTA, abnormal LFTs on labs who is seen for follow-up to discuss heart cath.  She established care with Dr. Harrington Challenger in 06/2021 for episodes of atypical chest pain and DOE. She recalls having a remote cath in 2020 with an [place] team without intervention at that time, done to evaluate valve disease (?). At the visit with Dr. Harrington Challenger, her recent chest pain was felt atypical but DOE was considered more concerning. 2D echo 07/21/21 EF 60-65% mild LVH, G1DD, mild calcification of AV, no significant valve disease. Coronary CTA was performed 08/03/21 with full results below, CAC 245 (91%ile), concerning for stenosis in mid LAD/D1, as well as moderate-large hiatal hernia and borderline enlarged right axillary lymph node is identified measuring 1.1 cm. FFR positive in small D1 and distal LAD. Dr. Harrington Challenger recommended to proceed with cath and she is brought in to discuss today.  She is seen for follow-up and was well prepared for today's visit with excellent questions. She hopes to get back to the gym once her heart status is evaluated, as she had to stop previously due to the worsening dyspnea on exertion. She continues to get episodes of fleeting focal chest pains that are short in duration, I.e. 2 minutes. These are not associated with exertion. Regarding LFTs, she does recall being told in the past that her numbers were fluctuating in the setting of Sjogren's. Abd CT 03/2020 without significant liver abnormality.   Labwork independently reviewed: 06/2021 Mg 2.1,  A1C 6.2, TSH wnl, LDL 118, trig 69, Na 134, K 4.0, Cr 0.81, AST/ALT 55/39, TProt 9.3, albumin 3.8, Hgb 10.9 (prev 10 in 05/2021, 13 in 03/2020), plt 238   Cardiology Studies:   Studies reviewed are outlined and summarized above. Reports included below if pertinent.   Cor CT 08/03/21  TECHNIQUE: The patient was scanned on a Siemens Force 562 slice scanner. Gantry rotation speed was 250 msecs. Collimation was .6 mm. A 100 kV prospective scan was triggered in the ascending thoracic aorta at 140 HU's Full mA was used between 35% and 75% of the R-R interval. Average HR during the scan was 63 bpm. The 3D data set was interpreted on a dedicated work station using MPR, MIP and VRT modes. A total of 80cc of contrast was used.   FINDINGS: Non-cardiac: See separate report from [facility] Radiology. No significant findings on limited lung and soft tissue windows.   Calcium Score: Calcium noted in LM, LAD, LCX   LM: 14.6   LAD 190   LCX 40.9   RCA 0   Total 245 which is 91 st percentile   Coronary Arteries: Left  dominant with no anomalies   LM: 1-25% calcified plaque   LAD: 50-69% mixed plaque in mid vessel beginning at D2 take off 1-24% calcified plaque distally   D1: 50-69% calcified plaque   D2: Normal see above ostium involved with LAD lesion   Circumflex: 1-24% proximal / mid vessel calcified plaque   OM1: Normal   OM2: Normal   PDA: Normal  PLB: Normal   RCA: Small non dominant normal   IMPRESSION: 1 calcium score 245 which is 91 st percentile for age/sex   2.  CAD RADS 3 CAD concerning in mid LAD/D1 Study sent for FFR   3.  Normal ascending thoracic aorta 3.2 cm   4.  Left dominant coronary arteries   5.  Significant hiatal hernia may be cause of patients symptoms   [name]   Electronically Signed: By: [name] M.D. On: 08/03/2021 09:53  2D Echo 07/21/21    1. Left ventricular ejection fraction, by estimation, is 60 to 65%. The  left  ventricle has normal function. The left ventricle has no regional  wall motion abnormalities. There is mild left ventricular hypertrophy.  Left ventricular diastolic parameters  are consistent with Grade II diastolic dysfunction (pseudonormalization).   2. Right ventricular systolic function is normal. The right ventricular  size is normal. Tricuspid regurgitation signal is inadequate for assessing  PA pressure.   3. The mitral valve is normal in structure. Trivial mitral valve  regurgitation. No evidence of mitral stenosis.   4. The aortic valve is tricuspid. There is mild calcification of the  aortic valve. Aortic valve regurgitation is not visualized. No aortic  stenosis is present.   5. The inferior vena cava is normal in size with greater than 50%  respiratory variability, suggesting right atrial pressure of 3 mmHg.     TECHNIQUE: The best systolic and diastolic phases of patients gated cardiac CTA sent to Heart Flow for analysis   FINDINGS: FFR CT normal in non dominant RCA and dominant LCX   FFR CT borderline in mid LAD 0.86 positive in distal LAD 0.73   FFR CT positive in D1 small vessel 0.72   FFR CT borderline in D2 0.85   IMPRESSION: FFR positive in small D1 and distal LAD can consider medical Rx or cath depending on symptoms   [name]     Electronically Signed   By: [name] M.D.   On: 08/03/2021 10:12  COMPARISON:  CT 10/20/2008   FINDINGS: Vascular: No acute abnormality.   Mediastinum/nodes: Borderline enlarged right axillary lymph node is identified measuring 1.1 cm. No mediastinal mass or adenopathy identified.   Lungs/pleura: No pleural effusion or airspace consolidation. No suspicious lung nodules.   Upper abdomen: Moderate to large size hiatal hernia. No acute findings.   Musculoskeletal: No acute or suspicious findings.   IMPRESSION: 1. No active cardiopulmonary abnormalities. 2. Moderate to large size hiatal hernia.      Electronically Signed   By: Kerby Moors M.D.   On: 08/03/2021 11:18    Past Medical History:  Diagnosis Date   Anemia    history   Anxiety    Aortic atherosclerosis (HCC)    Arthritis    mild RA, secondary to Sjogren's    CAD (coronary artery disease)    Complication of anesthesia    Depression    history of, treated with med. & therapy   Fibromyalgia    GERD (gastroesophageal reflux disease)    Heart murmur 2010   told by Dr. Birdie Riddle - seen perhaps on ECHO?   Hiatal hernia    History of kidney stones    passed spontaneously   Hyperlipidemia    Hypertension    PONV (postoperative nausea and vomiting)    mild nausea post op   Rheumatoid arthritis(714.0)    Sjogren's syndrome (HCC)     Past Surgical History:  Procedure Laterality  Date   ANTERIOR CERVICAL DECOMP/DISCECTOMY FUSION N/A 11/02/2017   Procedure: ANTERIOR CERVICAL DECOMPRESSION FUSION, CERVICAL 6-7 WITH INSTRUMENTATION AND ALLOGRAFT;  Surgeon: Phylliss Bob, MD;  Location: [place];  Service: Orthopedics;  Laterality: N/A;   OOPHORECTOMY     1 ovary remain, removed adhesions on bowel   REDUCTION MAMMAPLASTY Bilateral 11/2009   TONSILLECTOMY     born without tonsils    VESICOVAGINAL FISTULA CLOSURE W/ TAH      Current Medications: Current Meds  Medication Sig   calcium carbonate (ALKA-SELTZER HEARTBURN) 750 MG chewable tablet Chew 2 tablets by mouth daily as needed for heartburn.   diphenhydrAMINE (BENADRYL) 12.5 MG/5ML liquid Take 12.5-25 mg by mouth 4 (four) times daily as needed for allergies.   Ibuprofen 200 MG CAPS Take 800 mg by mouth every 6 (six) hours as needed (pain).   lisinopril-hydrochlorothiazide (ZESTORETIC) 20-25 MG tablet Take 1 tablet by mouth daily.      Allergies:   Amitriptyline, Latex, and Lipitor [atorvastatin]   Social History   Socioeconomic History   Marital status: Married    Spouse name: Not on file   Number of children: 1   Years of education: Not on file   Highest  education level: Not on file  Occupational History    Comment: [employer]    Employer: [employer]  Tobacco Use   Smoking status: Never   Smokeless tobacco: Never  Substance and Sexual Activity   Alcohol use: No   Drug use: Never   Sexual activity: Not on file  Other Topics Concern   Not on file  Social History Narrative   Not on file   Social Determinants of Health   Financial Resource Strain: Not on file  Food Insecurity: Not on file  Transportation Needs: Not on file  Physical Activity: Not on file  Stress: Not on file  Social Connections: Not on file     Family History:  The patient's family history includes Breast cancer in her maternal aunt; CVA in her father; Coronary artery disease in her mother; Diabetes in her mother; Heart attack in her father and paternal grandmother; Hypertension in her father and mother; Stroke in her maternal grandmother.  ROS:   Please see the history of present illness.  All other systems are reviewed and otherwise negative.    EKG(s)/Additional Labs   EKG:  EKG is ordered today, personally reviewed, demonstrating NSR 80bpm, no acute STT changes  Recent Labs: 07/01/2021: ALT 38; BUN 15; Creatinine, Ser 0.81; Hemoglobin 10.9; Magnesium 2.1; Platelets 238.0; Potassium 4.0; Sodium 134; TSH 1.62  Recent Lipid Panel    Component Value Date/Time   CHOL 189 07/01/2021 1339   TRIG 69.0 07/01/2021 1339   HDL 57.40 07/01/2021 1339   CHOLHDL 3 07/01/2021 1339   VLDL 13.8 07/01/2021 1339   LDLCALC 118 (H) 07/01/2021 1339   LDLDIRECT 162.7 03/24/2010 0921    PHYSICAL EXAM:    VS:  BP 130/72   Pulse 80   Ht '5\' 4"'$  (1.626 m)   Wt 190 lb (86.2 kg)   SpO2 98%   BMI 32.61 kg/m   BMI: Body mass index is 32.61 kg/m.  GEN: Well nourished, well developed female in no acute distress HEENT: normocephalic, atraumatic Neck: no JVD, carotid bruits, or masses Cardiac: RRR; no murmurs, rubs, or gallops, no edema  Respiratory:  clear to  auscultation bilaterally, normal work of breathing GI: soft, nontender, nondistended, + BS MS: no deformity or atrophy Skin: warm  and dry, no rash Neuro:  Alert and Oriented x 3, Strength and sensation are intact, follows commands Psych: euthymic mood, full affect  Wt Readings from Last 3 Encounters:  08/09/21 190 lb (86.2 kg)  07/06/21 186 lb (84.4 kg)  07/01/21 187 lb 6.4 oz (85 kg)     ASSESSMENT & PLAN:   1. CAD with possible atypical angina pectoris - I agree with Dr. Harrington Challenger that CP sounds atypical and might not be cardiac in nature, but DOE seems more consistent with possible atypical angina. As per Dr. Alan Ripper recommendation, have discussed cath with patient today and she is agreeable to proceed. Will start baby ASA. Will recheck CMET/CBC today and consider starting statin if LFTs are OK. May need referral to lipid clinic if the liver function remains an issue. I will hold off rx SL NTG until we see cath results as I am not entirely sure this would be effective for the chest pain she is periodically experiencing.  Shared Decision Making/Informed Consent The risks [stroke (1 in 1000), death (1 in 1000), kidney failure [usually temporary] (1 in 500), bleeding (1 in 200), allergic reaction [possibly serious] (1 in 200)], benefits (diagnostic support and management of coronary artery disease) and alternatives of a cardiac catheterization were discussed in detail with Ms. Manka and she is willing to proceed.  2. Essential HTN - BP top normal, reports excellent control at home (I.e. 409 systolic). Continue lisinopril/HCTZ.  3. Hyperlipidemia - lipid plan outlined above. Considering adding statin if LFTs stable.   4. Hiatal hernia, axillary lymph node - incidental pickups on CT, advised to f/u PCP to review. Radiologist did not specifically mention concern for the lymph node but definitely recommend attention upon followup with PCP.  5. Heart murmur - may be related to AV calcification, but  no overt valvular pathology. Continue to follow clinically.    Disposition: F/u with me after cath. Also included information about limiting ibuprofen on paperwork. When nurse went to review AVS instructions she was on the phone with an important work call, but agreed to review the written instructions in full.    Medication Adjustments/Labs and Tests Ordered: Current medicines are reviewed at length with the patient today.  Concerns regarding medicines are outlined above. Medication changes, Labs and Tests ordered today are summarized above and listed in the Patient Instructions accessible in Encounters.   Signed, Charlie Pitter, PA-C  08/09/2021 3:22 PM    [facility] Phone: [phone]; Fax: [phone]

## 2021-08-08 NOTE — Progress Notes (Signed)
Cardiology Office Note    Date:  08/09/2021   ID:  HAILYNN SLOVACEK, DOB April 14, 1955, MRN [id number]  PCP:  Fredirick Lathe, PA-C  Cardiologist:  Dorris Carnes, MD  Electrophysiologist:  None   Chief Complaint: discuss cath  History of Present Illness:   LEQUISHA CAMMACK is a 66 y.o. female with history of HTN, GERD, HLD, RA, anxiety, anemia, fibromyalgia, heart murmur, Sjogren's syndrome, hiatal hernia, aortic atherosclerosis and CAD demonstrated by recent CTA, abnormal LFTs on labs who is seen for follow-up to discuss heart cath.  She established care with Dr. Harrington Challenger in 06/2021 for episodes of atypical chest pain and DOE. She recalls having a remote cath in 2020 with an [facility] team without intervention at that time, done to evaluate valve disease (?). At the visit with Dr. Harrington Challenger, her recent chest pain was felt atypical but DOE was considered more concerning. 2D echo 07/21/21 EF 60-65% mild LVH, G1DD, mild calcification of AV, no significant valve disease. Coronary CTA was performed 08/03/21 with full results below, CAC 245 (91%ile), concerning for stenosis in mid LAD/D1, as well as moderate-large hiatal hernia and borderline enlarged right axillary lymph node is identified measuring 1.1 cm. FFR positive in small D1 and distal LAD. Dr. Harrington Challenger recommended to proceed with cath and she is brought in to discuss today.  She is seen for follow-up and was well prepared for today's visit with excellent questions. She hopes to get back to the gym once her heart status is evaluated, as she had to stop previously due to the worsening dyspnea on exertion. She continues to get episodes of fleeting focal chest pains that are short in duration, I.e. 2 minutes. These are not associated with exertion. Regarding LFTs, she does recall being told in the past that her numbers were fluctuating in the setting of Sjogren's. Abd CT 03/2020 without significant liver abnormality.   Labwork independently reviewed: 06/2021 Mg 2.1,  A1C 6.2, TSH wnl, LDL 118, trig 69, Na 134, K 4.0, Cr 0.81, AST/ALT 55/39, TProt 9.3, albumin 3.8, Hgb 10.9 (prev 10 in 05/2021, 13 in 03/2020), plt 238   Cardiology Studies:   Studies reviewed are outlined and summarized above. Reports included below if pertinent.   Cor CT 08/03/21  TECHNIQUE: The patient was scanned on a Siemens Force 937 slice scanner. Gantry rotation speed was 250 msecs. Collimation was .6 mm. A 100 kV prospective scan was triggered in the ascending thoracic aorta at 140 HU's Full mA was used between 35% and 75% of the R-R interval. Average HR during the scan was 63 bpm. The 3D data set was interpreted on a dedicated work station using MPR, MIP and VRT modes. A total of 80cc of contrast was used.   FINDINGS: Non-cardiac: See separate report from [facility]. No significant findings on limited lung and soft tissue windows.   Calcium Score: Calcium noted in LM, LAD, LCX   LM: 14.6   LAD 190   LCX 40.9   RCA 0   Total 245 which is 91 st percentile   Coronary Arteries: Left  dominant with no anomalies   LM: 1-25% calcified plaque   LAD: 50-69% mixed plaque in mid vessel beginning at D2 take off 1-24% calcified plaque distally   D1: 50-69% calcified plaque   D2: Normal see above ostium involved with LAD lesion   Circumflex: 1-24% proximal / mid vessel calcified plaque   OM1: Normal   OM2: Normal   PDA: Normal  PLB: Normal   RCA: Small non dominant normal   IMPRESSION: 1 calcium score 245 which is 91 st percentile for age/sex   2.  CAD RADS 3 CAD concerning in mid LAD/D1 Study sent for FFR   3.  Normal ascending thoracic aorta 3.2 cm   4.  Left dominant coronary arteries   5.  Significant hiatal hernia may be cause of patients symptoms   [name]   Electronically Signed: By: [name] M.D. On: 08/03/2021 09:53  2D Echo 07/21/21    1. Left ventricular ejection fraction, by estimation, is 60 to 65%. The  left  ventricle has normal function. The left ventricle has no regional  wall motion abnormalities. There is mild left ventricular hypertrophy.  Left ventricular diastolic parameters  are consistent with Grade II diastolic dysfunction (pseudonormalization).   2. Right ventricular systolic function is normal. The right ventricular  size is normal. Tricuspid regurgitation signal is inadequate for assessing  PA pressure.   3. The mitral valve is normal in structure. Trivial mitral valve  regurgitation. No evidence of mitral stenosis.   4. The aortic valve is tricuspid. There is mild calcification of the  aortic valve. Aortic valve regurgitation is not visualized. No aortic  stenosis is present.   5. The inferior vena cava is normal in size with greater than 50%  respiratory variability, suggesting right atrial pressure of 3 mmHg.     TECHNIQUE: The best systolic and diastolic phases of patients gated cardiac CTA sent to Heart Flow for analysis   FINDINGS: FFR CT normal in non dominant RCA and dominant LCX   FFR CT borderline in mid LAD 0.86 positive in distal LAD 0.73   FFR CT positive in D1 small vessel 0.72   FFR CT borderline in D2 0.85   IMPRESSION: FFR positive in small D1 and distal LAD can consider medical Rx or cath depending on symptoms   [name]     Electronically Signed   By: [name] M.D.   On: 08/03/2021 10:12  COMPARISON:  CT 10/20/2008   FINDINGS: Vascular: No acute abnormality.   Mediastinum/nodes: Borderline enlarged right axillary lymph node is identified measuring 1.1 cm. No mediastinal mass or adenopathy identified.   Lungs/pleura: No pleural effusion or airspace consolidation. No suspicious lung nodules.   Upper abdomen: Moderate to large size hiatal hernia. No acute findings.   Musculoskeletal: No acute or suspicious findings.   IMPRESSION: 1. No active cardiopulmonary abnormalities. 2. Moderate to large size hiatal hernia.      Electronically Signed   By: Kerby Moors M.D.   On: 08/03/2021 11:18    Past Medical History:  Diagnosis Date   Anemia    history   Anxiety    Aortic atherosclerosis (HCC)    Arthritis    mild RA, secondary to Sjogren's    CAD (coronary artery disease)    Complication of anesthesia    Depression    history of, treated with med. & therapy   Fibromyalgia    GERD (gastroesophageal reflux disease)    Heart murmur 2010   told by Dr. Birdie Riddle - seen perhaps on ECHO?   Hiatal hernia    History of kidney stones    passed spontaneously   Hyperlipidemia    Hypertension    PONV (postoperative nausea and vomiting)    mild nausea post op   Rheumatoid arthritis(714.0)    Sjogren's syndrome (HCC)     Past Surgical History:  Procedure Laterality  Date   ANTERIOR CERVICAL DECOMP/DISCECTOMY FUSION N/A 11/02/2017   Procedure: ANTERIOR CERVICAL DECOMPRESSION FUSION, CERVICAL 6-7 WITH INSTRUMENTATION AND ALLOGRAFT;  Surgeon: Phylliss Bob, MD;  Location: [facility];  Service: Orthopedics;  Laterality: N/A;   OOPHORECTOMY     1 ovary remain, removed adhesions on bowel   REDUCTION MAMMAPLASTY Bilateral 11/2009   TONSILLECTOMY     born without tonsils    VESICOVAGINAL FISTULA CLOSURE W/ TAH      Current Medications: Current Meds  Medication Sig   calcium carbonate (ALKA-SELTZER HEARTBURN) 750 MG chewable tablet Chew 2 tablets by mouth daily as needed for heartburn.   diphenhydrAMINE (BENADRYL) 12.5 MG/5ML liquid Take 12.5-25 mg by mouth 4 (four) times daily as needed for allergies.   Ibuprofen 200 MG CAPS Take 800 mg by mouth every 6 (six) hours as needed (pain).   lisinopril-hydrochlorothiazide (ZESTORETIC) 20-25 MG tablet Take 1 tablet by mouth daily.      Allergies:   Amitriptyline, Latex, and Lipitor [atorvastatin]   Social History   Socioeconomic History   Marital status: Married    Spouse name: Not on file   Number of children: 1   Years of education: Not on file   Highest  education level: Not on file  Occupational History    Comment: [employer]    Employer: [employer]  Tobacco Use   Smoking status: Never   Smokeless tobacco: Never  Substance and Sexual Activity   Alcohol use: No   Drug use: Never   Sexual activity: Not on file  Other Topics Concern   Not on file  Social History Narrative   Not on file   Social Determinants of Health   Financial Resource Strain: Not on file  Food Insecurity: Not on file  Transportation Needs: Not on file  Physical Activity: Not on file  Stress: Not on file  Social Connections: Not on file     Family History:  The patient's family history includes Breast cancer in her maternal aunt; CVA in her father; Coronary artery disease in her mother; Diabetes in her mother; Heart attack in her father and paternal grandmother; Hypertension in her father and mother; Stroke in her maternal grandmother.  ROS:   Please see the history of present illness.  All other systems are reviewed and otherwise negative.    EKG(s)/Additional Labs   EKG:  EKG is ordered today, personally reviewed, demonstrating NSR 80bpm, no acute STT changes  Recent Labs: 07/01/2021: ALT 38; BUN 15; Creatinine, Ser 0.81; Hemoglobin 10.9; Magnesium 2.1; Platelets 238.0; Potassium 4.0; Sodium 134; TSH 1.62  Recent Lipid Panel    Component Value Date/Time   CHOL 189 07/01/2021 1339   TRIG 69.0 07/01/2021 1339   HDL 57.40 07/01/2021 1339   CHOLHDL 3 07/01/2021 1339   VLDL 13.8 07/01/2021 1339   LDLCALC 118 (H) 07/01/2021 1339   LDLDIRECT 162.7 03/24/2010 0921    PHYSICAL EXAM:    VS:  BP 130/72   Pulse 80   Ht '5\' 4"'$  (1.626 m)   Wt 190 lb (86.2 kg)   SpO2 98%   BMI 32.61 kg/m   BMI: Body mass index is 32.61 kg/m.  GEN: Well nourished, well developed female in no acute distress HEENT: normocephalic, atraumatic Neck: no JVD, carotid bruits, or masses Cardiac: RRR; no murmurs, rubs, or gallops, no edema  Respiratory:  clear to  auscultation bilaterally, normal work of breathing GI: soft, nontender, nondistended, + BS MS: no deformity or atrophy Skin: warm  and dry, no rash Neuro:  Alert and Oriented x 3, Strength and sensation are intact, follows commands Psych: euthymic mood, full affect  Wt Readings from Last 3 Encounters:  08/09/21 190 lb (86.2 kg)  07/06/21 186 lb (84.4 kg)  07/01/21 187 lb 6.4 oz (85 kg)     ASSESSMENT & PLAN:   1. CAD with possible atypical angina pectoris - I agree with Dr. Harrington Challenger that CP sounds atypical and might not be cardiac in nature, but DOE seems more consistent with possible atypical angina. As per Dr. Alan Ripper recommendation, have discussed cath with patient today and she is agreeable to proceed. Will start baby ASA. Will recheck CMET/CBC today and consider starting statin if LFTs are OK. May need referral to lipid clinic if the liver function remains an issue. I will hold off rx SL NTG until we see cath results as I am not entirely sure this would be effective for the chest pain she is periodically experiencing.  Shared Decision Making/Informed Consent The risks [stroke (1 in 1000), death (1 in 1000), kidney failure [usually temporary] (1 in 500), bleeding (1 in 200), allergic reaction [possibly serious] (1 in 200)], benefits (diagnostic support and management of coronary artery disease) and alternatives of a cardiac catheterization were discussed in detail with Ms. Armas and she is willing to proceed.  2. Essential HTN - BP top normal, reports excellent control at home (I.e. 025 systolic). Continue lisinopril/HCTZ.  3. Hyperlipidemia - lipid plan outlined above. Considering adding statin if LFTs stable.   4. Hiatal hernia, axillary lymph node - incidental pickups on CT, advised to f/u PCP to review. Radiologist did not specifically mention concern for the lymph node but definitely recommend attention upon followup with PCP.  5. Heart murmur - may be related to AV calcification, but  no overt valvular pathology. Continue to follow clinically.    Disposition: F/u with me after cath. Also included information about limiting ibuprofen on paperwork. When nurse went to review AVS instructions she was on the phone with an important work call, but agreed to review the written instructions in full.    Medication Adjustments/Labs and Tests Ordered: Current medicines are reviewed at length with the patient today.  Concerns regarding medicines are outlined above. Medication changes, Labs and Tests ordered today are summarized above and listed in the Patient Instructions accessible in Encounters.   Signed, Charlie Pitter, PA-C  08/09/2021 3:22 PM    [facility] Phone: [phone]; Fax: [phone]

## 2021-08-09 ENCOUNTER — Encounter: Payer: Self-pay | Admitting: Physician Assistant

## 2021-08-09 ENCOUNTER — Ambulatory Visit: Payer: 59 | Admitting: Physician Assistant

## 2021-08-09 VITALS — BP 130/72 | HR 80 | Ht 64.0 in | Wt 190.0 lb

## 2021-08-09 DIAGNOSIS — I10 Essential (primary) hypertension: Secondary | ICD-10-CM | POA: Diagnosis not present

## 2021-08-09 DIAGNOSIS — K449 Diaphragmatic hernia without obstruction or gangrene: Secondary | ICD-10-CM

## 2021-08-09 DIAGNOSIS — R011 Cardiac murmur, unspecified: Secondary | ICD-10-CM

## 2021-08-09 DIAGNOSIS — I2510 Atherosclerotic heart disease of native coronary artery without angina pectoris: Secondary | ICD-10-CM | POA: Diagnosis not present

## 2021-08-09 DIAGNOSIS — E785 Hyperlipidemia, unspecified: Secondary | ICD-10-CM | POA: Diagnosis not present

## 2021-08-09 DIAGNOSIS — I209 Angina pectoris, unspecified: Secondary | ICD-10-CM | POA: Diagnosis not present

## 2021-08-09 MED ORDER — SODIUM CHLORIDE 0.9% FLUSH: 3.0000 mL | Freq: Two times a day (BID) | INTRAVENOUS | Status: AC

## 2021-08-09 MED ORDER — ASPIRIN 81 MG PO TBEC: 81.0000 mg | DELAYED_RELEASE_TABLET | Freq: Every day | ORAL | 3 refills | Status: AC

## 2021-08-09 NOTE — Patient Instructions (Addendum)
Medication Instructions:  Your physician recommends that you continue on your current medications as directed. Please refer to the Current Medication list given to you today. *If you need a refill on your cardiac medications before your next appointment, please call your pharmacy*   Lab Work: TODAY-CMET & CBC If you have labs (blood work) drawn today and your tests are completely normal, you will receive your results only by: [place] (if you have MyChart) OR A paper copy in the mail If you have any lab test that is abnormal or we need to change your treatment, we will call you to review the results.   Testing/Procedures: NONE ORDERED   Follow-Up: At [facility], you and your health needs are our priority.  As part of our continuing mission to provide you with exceptional heart care, we have created designated Provider Care Teams.  These Care Teams include your primary Cardiologist (physician) and Advanced Practice Providers (APPs -  Physician Assistants and Nurse Practitioners) who all work together to provide you with the care you need, when you need it.  We recommend signing up for the patient portal called "MyChart".  Sign up information is provided on this After Visit Summary.  MyChart is used to connect with patients for Virtual Visits (Telemedicine).  Patients are able to view lab/test results, encounter notes, upcoming appointments, etc.  Non-urgent messages can be sent to your provider as well.   To learn more about what you can do with MyChart, go to [email or web address].    Your next appointment:   2 week(s)  The format for your next appointment:   In Person  Provider:   Melina Copa, PA-C       Other Instructions   Important Information About Sugar        Patients taking blood thinners should generally stay away from medicines like ibuprofen, Advil, Motrin, naproxen, and Aleve due to risk of stomach bleeding. You may take Tylenol as directed or talk  to your primary doctor about alternatives.

## 2021-08-10 ENCOUNTER — Telehealth: Payer: Self-pay | Admitting: Internal Medicine

## 2021-08-10 LAB — COMPREHENSIVE METABOLIC PANEL
ALT: 30 IU/L (ref 0–32)
AST: 32 IU/L (ref 0–40)
Albumin/Globulin Ratio: 0.8 — ABNORMAL LOW (ref 1.2–2.2)
Albumin: 3.9 g/dL (ref 3.9–4.9)
Alkaline Phosphatase: 96 IU/L (ref 44–121)
BUN/Creatinine Ratio: 19 (ref 12–28)
BUN: 14 mg/dL (ref 8–27)
Bilirubin Total: <0.2 mg/dL (ref 0.0–1.2)
CO2: 21 mmol/L (ref 20–29)
Calcium: 9.5 mg/dL (ref 8.7–10.3)
Chloride: 101 mmol/L (ref 96–106)
Creatinine, Ser: 0.73 mg/dL (ref 0.57–1.00)
Globulin, Total: 4.9 g/dL — ABNORMAL HIGH (ref 1.5–4.5)
Glucose: 93 mg/dL (ref 70–99)
Potassium: 4.2 mmol/L (ref 3.5–5.2)
Sodium: 136 mmol/L (ref 134–144)
Total Protein: 8.8 g/dL — ABNORMAL HIGH (ref 6.0–8.5)
eGFR: 92 mL/min/{1.73_m2} (ref >59)

## 2021-08-10 LAB — CBC
Hematocrit: 33.7 % — ABNORMAL LOW (ref 34.0–46.6)
Hemoglobin: 11 g/dL — ABNORMAL LOW (ref 11.1–15.9)
MCH: 26.3 pg — ABNORMAL LOW (ref 26.6–33.0)
MCHC: 32.6 g/dL (ref 31.5–35.7)
MCV: 80 fL (ref 79–97)
Platelets: 251 10*3/uL (ref 150–450)
RBC: 4.19 x10E6/uL (ref 3.77–5.28)
RDW: 13.6 % (ref 11.7–15.4)
WBC: 7.3 10*3/uL (ref 3.4–10.8)

## 2021-08-10 NOTE — Telephone Encounter (Signed)
-----   Message from Charlie Pitter, [place] sent at 08/10/2021  6:47 AM EDT ----- Please let pt know pre-cath labs are stable. Mild anemia remains similar to prior. LFTs normal except total protein appears chronically elevated of unclear etiology -> recommend to discuss the significance with primary care if they had not previously worked this up.  Recs: - start rosuvastatin '10mg'$  daily with follow-up fasting lipid panel/LFTs in 6 weeks - find out if she has any questions about cath instructions - Darden Dates gave her them yesterday but patient had to take an important work call at the end of the visit and said she would read everything. Please also recommend that she limit ibuprofen use to only absolutely necessary since we started baby aspirin (ibuprofen is listed on med list). OK to use Tylenol OTC as directed

## 2021-08-10 NOTE — Telephone Encounter (Signed)
Patient notified of information from [place], [place].  She does not want to start Rosuvastatin at this time.  She will research this medication and discuss at post cath follow up appointment.  Protein levels are followed by rheumatology.  She is going to see a new rheumatologist soon.  Patient only uses Ibuprofen very infrequently.   Patient aware to hold lisinopril/HCTZ the day of cath. She has no questions regarding cath instructions.

## 2021-08-10 NOTE — Telephone Encounter (Signed)
Pt is calling back for lab results and is requesting a call back.  Can leave a detail message.

## 2021-08-11 ENCOUNTER — Telehealth: Payer: Self-pay | Admitting: *Deleted

## 2021-08-11 ENCOUNTER — Other Ambulatory Visit: Payer: Self-pay

## 2021-08-11 ENCOUNTER — Telehealth: Payer: Self-pay | Admitting: Physician Assistant

## 2021-08-11 DIAGNOSIS — R748 Abnormal levels of other serum enzymes: Secondary | ICD-10-CM

## 2021-08-11 DIAGNOSIS — M3500 Sicca syndrome, unspecified: Secondary | ICD-10-CM

## 2021-08-11 NOTE — Telephone Encounter (Signed)
Call placed to patient to review procedure instructions, voicemail message, no answer.

## 2021-08-11 NOTE — Telephone Encounter (Signed)
Follow Up:Marland Kitchen    This pt is returning your call from today.

## 2021-08-11 NOTE — Telephone Encounter (Addendum)
Cardiac Catheterization scheduled at [facility] for: Thursday August 12, 2021 9 AM Arrival time and place: [facility] Main Entrance A at: 7 AM   Nothing to eat after midnight prior to procedure, clear liquids until 5 AM day of procedure.  Medication instructions: -Hold:  Lisinopril/HCT-AM of procedure  -Except hold medications usual morning medications can be taken with sips of water including aspirin 81 mg.  Confirmed patient has responsible adult to drive home post procedure and be with patient first 24 hours after arriving home  Left message for patient to call back to review procedure instructions.

## 2021-08-11 NOTE — H&P (Signed)
Atypical symptoms. Cor CTA with pattern that is low risk and could be treated with medical therapy. However, if DOE is anginal equivalent, there could be more going on than noticeable on monitor.

## 2021-08-11 NOTE — Telephone Encounter (Signed)
Spoke with patient, reviewed instructions, will hold lisinopril/HCT AM of procedure, BP 130/72 at office visit 08/09/21, home BP readings good control  (ie 415 systolic).

## 2021-08-11 NOTE — Telephone Encounter (Signed)
LVM for pt to return my call, according to past OV notes pt was asked to follow up with Rheumatology and just needing to know does she already have a provider and wanting to switch to a different provider

## 2021-08-11 NOTE — Telephone Encounter (Signed)
..  Reason for Referral Request:    Has Patient been seen by PCP for this complaint? Yes   Reason: Autoimmune diseases  Referral to which Specialty: Rheumatologist   Preferred office/ provider:   Janus Molder? [facility] Rheumatology

## 2021-08-11 NOTE — Telephone Encounter (Signed)
Patient was returning call and need clarity, on if she is suppose to still take her bp pill. Please advise

## 2021-08-11 NOTE — Telephone Encounter (Signed)
Patient returned call and advised not seen rheumatology in 4 years so needed a new referral and advised what office she wanted to go to and which provider. Placed referral for pt per last ov note needing to follow up.

## 2021-08-11 NOTE — Telephone Encounter (Signed)
Left detailed message with procedure instructions (DPR) at [phone], no need to return call unless questions.

## 2021-08-12 ENCOUNTER — Encounter (HOSPITAL_COMMUNITY)
Admission: RE | Disposition: A | Discharge status: Home / Self Care | Payer: Self-pay | Source: Ambulatory Visit | Attending: Interventional Cardiology

## 2021-08-12 ENCOUNTER — Other Ambulatory Visit: Payer: Self-pay

## 2021-08-12 ENCOUNTER — Ambulatory Visit (HOSPITAL_COMMUNITY)
Admission: RE | Admit: 2021-08-12 | Discharge: 2021-08-12 | Disposition: A | Discharge status: Home / Self Care | Payer: 59 | Source: Ambulatory Visit | Attending: Interventional Cardiology | Admitting: Interventional Cardiology

## 2021-08-12 DIAGNOSIS — E785 Hyperlipidemia, unspecified: Secondary | ICD-10-CM | POA: Insufficient documentation

## 2021-08-12 DIAGNOSIS — I10 Essential (primary) hypertension: Secondary | ICD-10-CM | POA: Insufficient documentation

## 2021-08-12 DIAGNOSIS — K449 Diaphragmatic hernia without obstruction or gangrene: Secondary | ICD-10-CM | POA: Insufficient documentation

## 2021-08-12 DIAGNOSIS — I209 Angina pectoris, unspecified: Secondary | ICD-10-CM

## 2021-08-12 DIAGNOSIS — Z79899 Other long term (current) drug therapy: Secondary | ICD-10-CM | POA: Insufficient documentation

## 2021-08-12 DIAGNOSIS — I2510 Atherosclerotic heart disease of native coronary artery without angina pectoris: Secondary | ICD-10-CM

## 2021-08-12 DIAGNOSIS — R011 Cardiac murmur, unspecified: Secondary | ICD-10-CM | POA: Diagnosis not present

## 2021-08-12 DIAGNOSIS — I25110 Atherosclerotic heart disease of native coronary artery with unstable angina pectoris: Secondary | ICD-10-CM | POA: Insufficient documentation

## 2021-08-12 HISTORY — PX: LEFT HEART CATH AND CORONARY ANGIOGRAPHY: CATH118249

## 2021-08-12 SURGERY — LEFT HEART CATH AND CORONARY ANGIOGRAPHY
Anesthesia: LOCAL

## 2021-08-12 MED ORDER — ACETAMINOPHEN 325 MG PO TABS: 650.0000 mg | ORAL_TABLET | ORAL | Status: DC | PRN

## 2021-08-12 MED ORDER — VERAPAMIL HCL 2.5 MG/ML IV SOLN
INTRAVENOUS | Status: DC | PRN
  Administered 2021-08-12: 10 mL via INTRA_ARTERIAL

## 2021-08-12 MED ORDER — SODIUM CHLORIDE 0.9% FLUSH: 3.0000 mL | INTRAVENOUS | Status: DC | PRN

## 2021-08-12 MED ORDER — MIDAZOLAM HCL 2 MG/2ML IJ SOLN
INTRAMUSCULAR | Status: DC | PRN
  Administered 2021-08-12 (×2): 1 mg via INTRAVENOUS

## 2021-08-12 MED ORDER — HEPARIN (PORCINE) IN NACL 1000-0.9 UT/500ML-% IV SOLN
INTRAVENOUS | Status: AC
  Filled 2021-08-12: qty 1000

## 2021-08-12 MED ORDER — LABETALOL HCL 5 MG/ML IV SOLN: 10.0000 mg | INTRAVENOUS | Status: DC | PRN

## 2021-08-12 MED ORDER — LIDOCAINE HCL (PF) 1 % IJ SOLN
INTRAMUSCULAR | Status: DC | PRN
  Administered 2021-08-12: 2 mL

## 2021-08-12 MED ORDER — FENTANYL CITRATE (PF) 100 MCG/2ML IJ SOLN
INTRAMUSCULAR | Status: DC | PRN
  Administered 2021-08-12 (×2): 25 ug via INTRAVENOUS

## 2021-08-12 MED ORDER — HYDRALAZINE HCL 20 MG/ML IJ SOLN: 10.0000 mg | INTRAMUSCULAR | Status: DC | PRN

## 2021-08-12 MED ORDER — ONDANSETRON HCL 4 MG/2ML IJ SOLN: 4.0000 mg | Freq: Four times a day (QID) | INTRAMUSCULAR | Status: DC | PRN

## 2021-08-12 MED ORDER — MIDAZOLAM HCL 2 MG/2ML IJ SOLN
INTRAMUSCULAR | Status: AC
  Filled 2021-08-12: qty 2

## 2021-08-12 MED ORDER — ASPIRIN 81 MG PO CHEW
81.0000 mg | CHEWABLE_TABLET | ORAL | Status: AC
  Administered 2021-08-12: 81 mg via ORAL

## 2021-08-12 MED ORDER — ASPIRIN 325 MG PO TBEC: 325.0000 mg | DELAYED_RELEASE_TABLET | Freq: Every day | ORAL | Status: DC

## 2021-08-12 MED ORDER — SODIUM CHLORIDE 0.9 % WEIGHT BASED INFUSION: 1.0000 mL/kg/h | INTRAVENOUS | Status: DC

## 2021-08-12 MED ORDER — SODIUM CHLORIDE 0.9 % IV SOLN: INTRAVENOUS | Status: DC

## 2021-08-12 MED ORDER — HEPARIN SODIUM (PORCINE) 1000 UNIT/ML IJ SOLN
INTRAMUSCULAR | Status: DC | PRN
  Administered 2021-08-12: 4500 [IU] via INTRAVENOUS

## 2021-08-12 MED ORDER — VERAPAMIL HCL 2.5 MG/ML IV SOLN
INTRAVENOUS | Status: AC
  Filled 2021-08-12: qty 2

## 2021-08-12 MED ORDER — LIDOCAINE HCL (PF) 1 % IJ SOLN
INTRAMUSCULAR | Status: AC
  Filled 2021-08-12: qty 30

## 2021-08-12 MED ORDER — FENTANYL CITRATE (PF) 100 MCG/2ML IJ SOLN
INTRAMUSCULAR | Status: AC
  Filled 2021-08-12: qty 2

## 2021-08-12 MED ORDER — ASPIRIN 81 MG PO CHEW: 81.0000 mg | CHEWABLE_TABLET | Freq: Every day | ORAL | Status: DC

## 2021-08-12 MED ORDER — IOHEXOL 350 MG/ML SOLN
INTRAVENOUS | Status: DC | PRN
  Administered 2021-08-12: 85 mL

## 2021-08-12 MED ORDER — HEPARIN SODIUM (PORCINE) 1000 UNIT/ML IJ SOLN
INTRAMUSCULAR | Status: AC
  Filled 2021-08-12: qty 10

## 2021-08-12 MED ORDER — SODIUM CHLORIDE 0.9 % IV SOLN: 250.0000 mL | INTRAVENOUS | Status: DC | PRN

## 2021-08-12 MED ORDER — SODIUM CHLORIDE 0.9% FLUSH: 3.0000 mL | Freq: Two times a day (BID) | INTRAVENOUS | Status: DC

## 2021-08-12 MED ORDER — SODIUM CHLORIDE 0.9 % WEIGHT BASED INFUSION
3.0000 mL/kg/h | INTRAVENOUS | Status: AC
  Administered 2021-08-12: 3 mL/kg/h via INTRAVENOUS

## 2021-08-12 MED ORDER — HEPARIN (PORCINE) IN NACL 1000-0.9 UT/500ML-% IV SOLN
INTRAVENOUS | Status: DC | PRN
  Administered 2021-08-12 (×2): 500 mL

## 2021-08-12 SURGICAL SUPPLY — 11 items
BAND ZEPHYR COMPRESS 30 LONG (HEMOSTASIS) ×1 IMPLANT
CATH 5FR JL3.5 JR4 ANG PIG MP (CATHETERS) ×1 IMPLANT
CATH LAUNCHER 5F EBU3.5 (CATHETERS) ×1 IMPLANT
GLIDESHEATH SLEND A-KIT 6F 22G (SHEATH) ×1 IMPLANT
GUIDEWIRE INQWIRE 1.5J.035X260 (WIRE) IMPLANT
INQWIRE 1.5J .035X260CM (WIRE) ×2
KIT HEART LEFT (KITS) ×2 IMPLANT
PACK CARDIAC CATHETERIZATION (CUSTOM PROCEDURE TRAY) ×2 IMPLANT
SHEATH PROBE COVER 6X72 (BAG) ×1 IMPLANT
TRANSDUCER W/STOPCOCK (MISCELLANEOUS) ×2 IMPLANT
TUBING CIL FLEX 10 FLL-RA (TUBING) ×2 IMPLANT

## 2021-08-12 NOTE — CV Procedure (Signed)
Mid to distal LAD 50 to 60% region of plaque that overlaps the second diagonal..  CT FFR demonstrated borderline FFR. Nondominant right coronary Coronaries otherwise, widely patent.  Medical therapy including risk factor modification, blood pressure lowering, treatment of diastolic heart failure with SGLT2 therapy.  Stenting of the mid to distal LAD would jailed a large diagonal and will more than likely cause her more trouble than treating the native vessel with medical therapy.

## 2021-08-12 NOTE — Interval H&P Note (Signed)
Cath Lab Visit (complete for each Cath Lab visit)  Clinical Evaluation Leading to the Procedure:   ACS: No.  Non-ACS:    Anginal Classification: CCS III  Anti-ischemic medical therapy: No Therapy  Non-Invasive Test Results: Low-risk stress test findings: cardiac mortality <1%/year  Prior CABG: No previous CABG      History and Physical Interval Note:  08/12/2021 9:48 AM  [name]  has presented today for surgery, with the diagnosis of unstable angina, abnormal cardiac CT.  The various methods of treatment have been discussed with the patient and family. After consideration of risks, benefits and other options for treatment, the patient has consented to  Procedure(s): LEFT HEART CATH AND CORONARY ANGIOGRAPHY (N/A) as a surgical intervention.  The patient's history has been reviewed, patient examined, no change in status, stable for surgery.  I have reviewed the patient's chart and labs.  Questions were answered to the patient's satisfaction.     [name]

## 2021-08-13 ENCOUNTER — Encounter (HOSPITAL_COMMUNITY): Payer: Self-pay | Admitting: Interventional Cardiology

## 2021-08-13 ENCOUNTER — Ambulatory Visit: Payer: 59 | Admitting: Physician Assistant

## 2021-08-16 ENCOUNTER — Telehealth: Payer: Self-pay | Admitting: Interventional Cardiology

## 2021-08-16 ENCOUNTER — Encounter: Payer: Self-pay | Admitting: Physician Assistant

## 2021-08-16 ENCOUNTER — Ambulatory Visit: Payer: 59 | Admitting: Physician Assistant

## 2021-08-16 ENCOUNTER — Other Ambulatory Visit: Payer: Self-pay | Admitting: Physician Assistant

## 2021-08-16 VITALS — BP 134/86 | HR 76 | Temp 97.7°F | Ht 64.0 in | Wt 187.8 lb

## 2021-08-16 DIAGNOSIS — N6331 Unspecified lump in axillary tail of the right breast: Secondary | ICD-10-CM

## 2021-08-16 DIAGNOSIS — Z1211 Encounter for screening for malignant neoplasm of colon: Secondary | ICD-10-CM | POA: Diagnosis not present

## 2021-08-16 MED ORDER — DOXYCYCLINE HYCLATE 100 MG PO TABS: 100.0000 mg | ORAL_TABLET | Freq: Two times a day (BID) | ORAL | 0 refills | Status: AC

## 2021-08-16 NOTE — Progress Notes (Signed)
Subjective:    Patient ID: [name], female    DOB: Sep 12, 1955, 66 y.o.   MRN: [id number]  Chief Complaint  Patient presents with   Adenopathy    Pt c/o swollen lymph nodes under right arm, had heart cath done on Thursday and pt states has felt jittery since. Pt needs Colonoscopy may be eligible for Cologuard; Pt also needs Pap and didn't think she needed to since having partial hysterectomy.     HPI Patient is in today for concern about ?lymph nodes under right arm. Wondering if she might have an infection. Not sure how long this area has been there. Not much pain. No redness. No fevers or chills. Some night sweats, but says those have been present since hysterectomy. Feeling a little jittery / ?nervous as well. Heart cath procedure done on 08/12/21, reassuring results which she is thankful for.  Hx bilateral reduction mammaplasty - Scar tissue present, but not tender.  Last mammogram was 11/22/2019, negative.  Maternal aunt with hx breast CA.   Last month had similar nodes under right side of jaw, warm compresses helped them go away.   Past Medical History:  Diagnosis Date   Anemia    history   Anxiety    Aortic atherosclerosis (HCC)    Arthritis    mild RA, secondary to Sjogren's    CAD (coronary artery disease)    Complication of anesthesia    Depression    history of, treated with med. & therapy   Fibromyalgia    GERD (gastroesophageal reflux disease)    Heart murmur 2010   told by Dr. Birdie Riddle - seen perhaps on ECHO?   Hiatal hernia    History of kidney stones    passed spontaneously   Hyperlipidemia    Hypertension    PONV (postoperative nausea and vomiting)    mild nausea post op   Rheumatoid arthritis(714.0)    Sjogren's syndrome (HCC)     Past Surgical History:  Procedure Laterality Date   ANTERIOR CERVICAL DECOMP/DISCECTOMY FUSION N/A 11/02/2017   Procedure: ANTERIOR CERVICAL DECOMPRESSION FUSION, CERVICAL 6-7 WITH INSTRUMENTATION AND ALLOGRAFT;   Surgeon: Phylliss Bob, MD;  Location: [facility];  Service: Orthopedics;  Laterality: N/A;   LEFT HEART CATH AND CORONARY ANGIOGRAPHY N/A 08/12/2021   Procedure: LEFT HEART CATH AND CORONARY ANGIOGRAPHY;  Surgeon: Belva Crome, MD;  Location: [facility];  Service: Cardiovascular;  Laterality: N/A;   OOPHORECTOMY     1 ovary remain, removed adhesions on bowel   REDUCTION MAMMAPLASTY Bilateral 11/2009   TONSILLECTOMY     born without tonsils    VESICOVAGINAL FISTULA CLOSURE W/ TAH      Family History  Problem Relation Age of Onset   Coronary artery disease Mother    Hypertension Mother    Diabetes Mother    Heart attack Father    Hypertension Father    CVA Father    Stroke Maternal Grandmother    Heart attack Paternal Grandmother    Breast cancer Maternal Aunt     Social History   Tobacco Use   Smoking status: Never   Smokeless tobacco: Never  Substance Use Topics   Alcohol use: No   Drug use: Never     Allergies  Allergen Reactions   Amitriptyline Swelling    Leg swelling    Latex Other (See Comments)    Burns skin   Lipitor [Atorvastatin] Itching    Review of Systems NEGATIVE UNLESS OTHERWISE INDICATED IN  HPI      Objective:     BP 134/86 (BP Location: Left Arm)   Pulse 76   Temp 97.7 F (36.5 C) (Temporal)   Ht '5\' 4"'$  (1.626 m)   Wt 187 lb 12.8 oz (85.2 kg)   SpO2 98%   BMI 32.24 kg/m   Wt Readings from Last 3 Encounters:  08/16/21 187 lb 12.8 oz (85.2 kg)  08/12/21 185 lb (83.9 kg)  08/09/21 190 lb (86.2 kg)    BP Readings from Last 3 Encounters:  08/16/21 134/86  08/12/21 118/69  08/09/21 130/72     Physical Exam Vitals and nursing note reviewed.  Constitutional:      Appearance: Normal appearance.  Eyes:     Extraocular Movements: Extraocular movements intact.     Pupils: Pupils are equal, round, and reactive to light.  Cardiovascular:     Rate and Rhythm: Normal rate and regular rhythm.     Pulses: Normal pulses.   Pulmonary:     Effort: Pulmonary effort is normal.     Breath sounds: Normal breath sounds.  Chest:     Chest wall: Mass (right tail of spence; two 1 cm mobile, firm, nontender masses noted) present.  Breasts:    Right: No inverted nipple, nipple discharge or tenderness.     Left: No inverted nipple, nipple discharge or tenderness.     Comments: Post-surgical scarring from prior breast reduction Abdominal:     General: Abdomen is flat. Bowel sounds are normal.     Palpations: Abdomen is soft. There is no mass.     Tenderness: There is no abdominal tenderness. There is no right CVA tenderness, left CVA tenderness, guarding or rebound.  Lymphadenopathy:     Upper Body:     Right upper body: No supraclavicular or axillary adenopathy.     Left upper body: No supraclavicular or axillary adenopathy.  Neurological:     General: No focal deficit present.     Mental Status: She is alert and oriented to person, place, and time.     Motor: No weakness.     Gait: Gait normal.  Psychiatric:        Mood and Affect: Mood normal.        Behavior: Behavior normal.        Assessment & Plan:   Problem List Items Addressed This Visit   None Visit Diagnoses     Mass of axillary tail of right breast    -  Primary   Relevant Orders   MM DIAG BREAST TOMO BILATERAL   Colon cancer screening       Relevant Orders   Cologuard        Meds ordered this encounter  Medications   doxycycline (VIBRA-TABS) 100 MG tablet    Sig: Take 1 tablet (100 mg total) by mouth 2 (two) times daily for 7 days.    Dispense:  14 tablet    Refill:  0    Order Specific Question:   Supervising Provider    Answer:   Yong Channel, STEPHEN O [[id number]]    1. Mass of axillary tail of right breast Suspicious, unsure how long this finding has been present - scheduled for stat diagnostic mammogram after today's completed visit - AVS for when /where, pt called with this information.  -On the chance this is any type of early  infection, may start on doxycycline; Pt aware of risks vs benefits and possible adverse reactions. Warm compresses also advised. -Pt to  keep me updated  2. Colon cancer screening -Pt wanted to have Cologuard; low-risk; order sent today   Malorie Bigford M Mattis Featherly, PA-C

## 2021-08-16 NOTE — Telephone Encounter (Signed)
Patient called wanting to know if it would be OK for her to go back to work tomorrow (8/1).

## 2021-08-16 NOTE — Patient Instructions (Signed)
Diagnostic mammogram scheduled for this Thursday -- arrive at 9:10 at [address] # [phone]

## 2021-08-18 NOTE — Progress Notes (Addendum)
Cardiology Office Note    Date:  08/20/2021   ID:  [name], DOB [birth year], MRN [id number]  PCP:  Fredirick Lathe, PA-C  Cardiologist:  Dorris Carnes, MD  Electrophysiologist:  None   Chief Complaint: f/u cath  History of Present Illness:   [name] is a 66 y.o. female with history of HTN, GERD, HLD, RA, anxiety, anemia, fibromyalgia, heart murmur, Sjogren's syndrome, hiatal hernia, aortic atherosclerosis, intermittently elevated LFTs on labs, and moderate CAD by recent cath who is seen for follow-up.  She established care with Dr. Harrington Challenger in 06/2021 for episodes of atypical chest pain and DOE. Of the two symptoms, the DOE was considered more concerning. 2D echo 07/21/21 EF 60-65% mild LVH, G1DD, mild calcification of AV, no significant valve disease. Coronary CTA was performed 08/03/21 with full results below, CAC 245 (91%ile), concerning for stenosis in mid LAD/D1, as well as moderate-large hiatal hernia and borderline enlarged right axillary lymph node is identified measuring 1.1 cm. FFR was positive in small D1 and distal LAD. She was encouraged to f/u PCP for the ancillary findings. Dr. Harrington Challenger had recommended to proceed with cath. Cath 08/12/21 showed 40-60% mid-to-distal LAD overlapping a large second diagonal, no significant obstructive disease otherwise, LVEDP normal. Dr. Tamala Julian recommended anti-ischemic therapy and if residual uncontrollable symptoms, consider stenting of LAD. Unrelated to the above history, I see that she remotely saw heme-onc in 2013 for hypergammaglobulinemia felt related to her rheumatologic disease at the time. Repeat SPEP 2019 did not show an M Spike either. Her Tprot has been persistently elevated for over 10 years.  She is seen for follow-up overall stable from a cardiac standpoint. Main complaint is generalized fatigue. She feels like her Sjogren's is flaring up her fibromyalgia and plans to see her rheumatologist. Primary care also arranged for her to have  a biopsy of the lymph node seen on CT. She still has occasional pings of CP and DOE but no anginal sounding CP.     Labwork independently reviewed: 08/09/21 Hgb 11.0, plt 251, K 4.2, Cr 0.73, AST/ALT OK, TProt 8.8 (advised to f/u PCP for elev protein of unclear significance) 06/2021 Mg 2.1, A1C 6.2, TSH wnl, LDL 118, trig 69, Na 134, K 4.0, Cr 0.81, AST/ALT 55/39, TProt 9.3, albumin 3.8, Hgb 10.9 (prev 10 in 05/2021, 13 in 03/2020), plt 238   Cardiology Studies:   Studies reviewed are outlined and summarized above. Reports included below if pertinent.   LHC 08/12/21 CONCLUSIONS: 40 to 60% mid to distal LAD overlapping a large second diagonal.  CT FFR was positive in the apical LAD 0.75. No significant obstructive disease otherwise in a patient with a left dominant system. LVEDP normal.  Grade 2 diastolic dysfunction on echo.   RECOMMENDATIONS:   Patient should be started on preventive and anti-ischemic therapy.  Recommend statin, beta-blocker or negative inotropic calcium channel blocker, aerobic activity, and manage LAD with stenting if uncontrollable symptoms. Based on echo the patient has grade 2 diastolic dysfunction and should be considered for SGLT2 therapy as much for symptoms could be related to diastolic heart failure although no signal towards that based upon normal EDP.   Cor CT 08/03/21   TECHNIQUE: The patient was scanned on a Siemens Force 983 slice scanner. Gantry rotation speed was 250 msecs. Collimation was .6 mm. A 100 kV prospective scan was triggered in the ascending thoracic aorta at 140 HU's Full mA was used between 35% and 75% of the R-R interval. Average  HR during the scan was 63 bpm. The 3D data set was interpreted on a dedicated work station using MPR, MIP and VRT modes. A total of 80cc of contrast was used.   FINDINGS: Non-cardiac: See separate report from [facility]. No significant findings on limited lung and soft tissue windows.   Calcium  Score: Calcium noted in LM, LAD, LCX   LM: 14.6   LAD 190   LCX 40.9   RCA 0   Total 245 which is 91 st percentile   Coronary Arteries: Left  dominant with no anomalies   LM: 1-25% calcified plaque   LAD: 50-69% mixed plaque in mid vessel beginning at D2 take off 1-24% calcified plaque distally   D1: 50-69% calcified plaque   D2: Normal see above ostium involved with LAD lesion   Circumflex: 1-24% proximal / mid vessel calcified plaque   OM1: Normal   OM2: Normal   PDA: Normal   PLB: Normal   RCA: Small non dominant normal   IMPRESSION: 1 calcium score 245 which is 91 st percentile for age/sex   2.  CAD RADS 3 CAD concerning in mid LAD/D1 Study sent for FFR   3.  Normal ascending thoracic aorta 3.2 cm   4.  Left dominant coronary arteries   5.  Significant hiatal hernia may be cause of patients symptoms   [name]   Electronically Signed: By: [name] M.D. On: 08/03/2021 09:53   2D Echo 07/21/21    1. Left ventricular ejection fraction, by estimation, is 60 to 65%. The  left ventricle has normal function. The left ventricle has no regional  wall motion abnormalities. There is mild left ventricular hypertrophy.  Left ventricular diastolic parameters  are consistent with Grade II diastolic dysfunction (pseudonormalization).   2. Right ventricular systolic function is normal. The right ventricular  size is normal. Tricuspid regurgitation signal is inadequate for assessing  PA pressure.   3. The mitral valve is normal in structure. Trivial mitral valve  regurgitation. No evidence of mitral stenosis.   4. The aortic valve is tricuspid. There is mild calcification of the  aortic valve. Aortic valve regurgitation is not visualized. No aortic  stenosis is present.   5. The inferior vena cava is normal in size with greater than 50%  respiratory variability, suggesting right atrial pressure of 3 mmHg.      TECHNIQUE: The best systolic and  diastolic phases of patients gated cardiac CTA sent to Heart Flow for analysis   FINDINGS: FFR CT normal in non dominant RCA and dominant LCX   FFR CT borderline in mid LAD 0.86 positive in distal LAD 0.73   FFR CT positive in D1 small vessel 0.72   FFR CT borderline in D2 0.85   IMPRESSION: FFR positive in small D1 and distal LAD can consider medical Rx or cath depending on symptoms   [name]     Electronically Signed   By: [name] M.D.   On: 08/03/2021 10:12   COMPARISON:  CT 10/20/2008   FINDINGS: Vascular: No acute abnormality.   Mediastinum/nodes: Borderline enlarged right axillary lymph node is identified measuring 1.1 cm. No mediastinal mass or adenopathy identified.   Lungs/pleura: No pleural effusion or airspace consolidation. No suspicious lung nodules.   Upper abdomen: Moderate to large size hiatal hernia. No acute findings.   Musculoskeletal: No acute or suspicious findings.   IMPRESSION: 1. No active cardiopulmonary abnormalities. 2. Moderate to large size hiatal hernia.  Electronically Signed   By: Kerby Moors M.D.   On: 08/03/2021 11:18    Past Medical History:  Diagnosis Date   Anemia    history   Anxiety    Aortic atherosclerosis (HCC)    Arthritis    mild RA, secondary to Sjogren's    CAD (coronary artery disease)    Complication of anesthesia    Depression    history of, treated with med. & therapy   Fibromyalgia    GERD (gastroesophageal reflux disease)    Heart murmur 2010   told by Dr. Birdie Riddle - seen perhaps on ECHO?   Hiatal hernia    History of kidney stones    passed spontaneously   Hyperlipidemia    Hypertension    PONV (postoperative nausea and vomiting)    mild nausea post op   Rheumatoid arthritis(714.0)    Sjogren's syndrome (HCC)     Past Surgical History:  Procedure Laterality Date   ANTERIOR CERVICAL DECOMP/DISCECTOMY FUSION N/A 11/02/2017   Procedure: ANTERIOR CERVICAL DECOMPRESSION  FUSION, CERVICAL 6-7 WITH INSTRUMENTATION AND ALLOGRAFT;  Surgeon: Phylliss Bob, MD;  Location: [facility];  Service: Orthopedics;  Laterality: N/A;   LEFT HEART CATH AND CORONARY ANGIOGRAPHY N/A 08/12/2021   Procedure: LEFT HEART CATH AND CORONARY ANGIOGRAPHY;  Surgeon: Belva Crome, MD;  Location: [facility];  Service: Cardiovascular;  Laterality: N/A;   OOPHORECTOMY     1 ovary remain, removed adhesions on bowel   REDUCTION MAMMAPLASTY Bilateral 11/2009   TONSILLECTOMY     born without tonsils    VESICOVAGINAL FISTULA CLOSURE W/ TAH      Current Medications: Current Meds  Medication Sig   aspirin EC 81 MG tablet Take 1 tablet (81 mg total) by mouth daily. Swallow whole.   doxycycline (VIBRA-TABS) 100 MG tablet Take 1 tablet (100 mg total) by mouth 2 (two) times daily for 7 days.   lisinopril-hydrochlorothiazide (ZESTORETIC) 20-25 MG tablet Take 1 tablet by mouth daily.     Allergies:   Amitriptyline, Latex, and Lipitor [atorvastatin]   Social History   Socioeconomic History   Marital status: Married    Spouse name: Not on file   Number of children: 1   Years of education: Not on file   Highest education level: Not on file  Occupational History    Comment: [employer]    Employer: [employer]  Tobacco Use   Smoking status: Never   Smokeless tobacco: Never  Substance and Sexual Activity   Alcohol use: No   Drug use: Never   Sexual activity: Not on file  Other Topics Concern   Not on file  Social History Narrative   Not on file   Social Determinants of Health   Financial Resource Strain: Not on file  Food Insecurity: Not on file  Transportation Needs: Not on file  Physical Activity: Not on file  Stress: Not on file  Social Connections: Not on file     Family History:  The patient's family history includes Breast cancer in her maternal aunt; CVA in her father; Coronary artery disease in her mother; Diabetes in her mother; Heart attack in her father  and paternal grandmother; Hypertension in her father and mother; Stroke in her maternal grandmother.  ROS:   Please see the history of present illness.  All other systems are reviewed and otherwise negative.    EKG(s)/Additional Labs   EKG:  EKG is not ordered today  Recent Labs: 07/01/2021: Magnesium  2.1; TSH 1.62 08/09/2021: ALT 30; BUN 14; Creatinine, Ser 0.73; Hemoglobin 11.0; Platelets 251; Potassium 4.2; Sodium 136  Recent Lipid Panel    Component Value Date/Time   CHOL 189 07/01/2021 1339   TRIG 69.0 07/01/2021 1339   HDL 57.40 07/01/2021 1339   CHOLHDL 3 07/01/2021 1339   VLDL 13.8 07/01/2021 1339   LDLCALC 118 (H) 07/01/2021 1339   LDLDIRECT 162.7 03/24/2010 0921    PHYSICAL EXAM:    VS:  BP 138/70   Pulse 94   Ht '5\' 4"'$  (1.626 m)   Wt 189 lb (85.7 kg)   SpO2 98%   BMI 32.44 kg/m   BMI: Body mass index is 32.44 kg/m.  GEN: Well nourished, well developed female in no acute distress HEENT: normocephalic, atraumatic Neck: no JVD, carotid bruits, or masses Cardiac: RRR; no murmurs, rubs, or gallops, no edema  Respiratory:  clear to auscultation bilaterally, normal work of breathing GI: soft, nontender, nondistended, + BS MS: no deformity or atrophy Skin: warm and dry, no rash, right radial cath site without hematoma or ecchymosis; good pulse. Neuro:  Alert and Oriented x 3, Strength and sensation are intact, follows commands Psych: euthymic mood, full affect  Wt Readings from Last 3 Encounters:  08/20/21 189 lb (85.7 kg)  08/16/21 187 lb 12.8 oz (85.2 kg)  08/12/21 185 lb (83.9 kg)     ASSESSMENT & PLAN:   1. CAD, HLD goal LDL <70 - recent cath with moderate range disease, recommended for medical therapy. We discussed medication options. Per our discussion, will hold off beta blocker given generalized fatigue for which she has ongoing f/u with her other care team members (PCP, rheum). Will trial low dose amlodipine 2.'5mg'$  daily. Continue ASA. Refer to lipid  clinic for management of HLD given h/o statin allergy with itching. Do not suspect her chest pain is anginal in nature given the description so held off SL NTG so as not to confuse her - do not think it would be useful for fleeting neuropathic type pain. She mentions she may need lymph node biopsy. From cardiac standpoint if clearance is requested she is OK to have this procedure from my perspective and may hold ASA 5-7 days beforehand if requested.  2. Diastolic dysfunction without heart failure - normal LVEDP by cath. No clinical heart failure on exam. Could consider SGLT2i in the future but per shared decision making, plan one med change at a time.  3. Essential HTN - she feels her BP is up slightly today because of her fibromyalgia. Will follow with addition of amlodipine. Patient will bring BP log to next visit.  4. Heart murmur - not as pronounced on exam today. Prior mild calcification of AV noted but otherwise no significant valvular disease.     Disposition: Refer to pharmD lipid clinic. Will also arrange f/u with me in 3-4 months.  Medication Adjustments/Labs and Tests Ordered: Current medicines are reviewed at length with the patient today.  Concerns regarding medicines are outlined above. Medication changes, Labs and Tests ordered today are summarized above and listed in the Patient Instructions accessible in Encounters.   Signed, Charlie Pitter, PA-C  08/20/2021 3:47 PM    [place] Phone: [phone]; Fax: [phone]

## 2021-08-19 ENCOUNTER — Ambulatory Visit
Admission: RE | Admit: 2021-08-19 | Discharge: 2021-08-19 | Disposition: A | Discharge status: Home / Self Care | Payer: 59 | Source: Ambulatory Visit | Attending: Physician Assistant | Admitting: Physician Assistant

## 2021-08-19 ENCOUNTER — Other Ambulatory Visit: Payer: Self-pay | Admitting: Physician Assistant

## 2021-08-19 ENCOUNTER — Encounter: Payer: Self-pay | Admitting: Physician Assistant

## 2021-08-19 DIAGNOSIS — N6331 Unspecified lump in axillary tail of the right breast: Secondary | ICD-10-CM

## 2021-08-20 ENCOUNTER — Encounter: Payer: Self-pay | Admitting: Physician Assistant

## 2021-08-20 ENCOUNTER — Ambulatory Visit: Payer: 59 | Admitting: Physician Assistant

## 2021-08-20 VITALS — BP 138/70 | HR 94 | Ht 64.0 in | Wt 189.0 lb

## 2021-08-20 DIAGNOSIS — R011 Cardiac murmur, unspecified: Secondary | ICD-10-CM

## 2021-08-20 DIAGNOSIS — I2510 Atherosclerotic heart disease of native coronary artery without angina pectoris: Secondary | ICD-10-CM | POA: Diagnosis not present

## 2021-08-20 DIAGNOSIS — I10 Essential (primary) hypertension: Secondary | ICD-10-CM

## 2021-08-20 DIAGNOSIS — E785 Hyperlipidemia, unspecified: Secondary | ICD-10-CM

## 2021-08-20 DIAGNOSIS — I5189 Other ill-defined heart diseases: Secondary | ICD-10-CM | POA: Diagnosis not present

## 2021-08-20 MED ORDER — AMLODIPINE BESYLATE 2.5 MG PO TABS: 2.5000 mg | ORAL_TABLET | Freq: Every day | ORAL | 3 refills | Status: DC

## 2021-08-20 NOTE — Patient Instructions (Addendum)
Medication Instructions:  START Amlodipine 2.'5mg'$  Take 1 tablet once a day  *If you need a refill on your cardiac medications before your next appointment, please call your pharmacy*   Lab Work: None Ordered    Testing/Procedures: None Ordered   Follow-Up: At [facility], you and your health needs are our priority.  As part of our continuing mission to provide you with exceptional heart care, we have created designated Provider Care Teams.  These Care Teams include your primary Cardiologist (physician) and Advanced Practice Providers (APPs -  Physician Assistants and Nurse Practitioners) who all work together to provide you with the care you need, when you need it.  We recommend signing up for the patient portal called "MyChart".  Sign up information is provided on this After Visit Summary.  MyChart is used to connect with patients for Virtual Visits (Telemedicine).  Patients are able to view lab/test results, encounter notes, upcoming appointments, etc.  Non-urgent messages can be sent to your provider as well.   To learn more about what you can do with MyChart, go to [email or web address].    Your next appointment:   3-4 month(s)  The format for your next appointment:   In Person  Provider:   Melina Copa, PA-C       You have been referred to [facility]  Other Instructions   Important Information About Sugar

## 2021-08-25 ENCOUNTER — Ambulatory Visit
Admission: RE | Admit: 2021-08-25 | Discharge: 2021-08-25 | Disposition: A | Discharge status: Home / Self Care | Payer: 59 | Source: Ambulatory Visit | Attending: Physician Assistant | Admitting: Physician Assistant

## 2021-08-25 ENCOUNTER — Telehealth: Payer: Self-pay | Admitting: Physician Assistant

## 2021-08-25 DIAGNOSIS — N6331 Unspecified lump in axillary tail of the right breast: Secondary | ICD-10-CM

## 2021-08-25 DIAGNOSIS — Z0279 Encounter for issue of other medical certificate: Secondary | ICD-10-CM

## 2021-08-25 NOTE — Telephone Encounter (Signed)
..  Type of form received:medical accommodation request   Additional comments:   Received by: patient  Form should be Faxed ZP:[phone]  Form should be mailed to:    Is patient requesting call for pickup: yes    Form placed:  [facility] folder  Attach charge sheet.  Yes   Individual made aware of 3-5 business day turn around (Y/N)?   Yes   Note: patient brought two copies just in case a mistake is made, office has two.

## 2021-08-27 ENCOUNTER — Ambulatory Visit: Payer: 59 | Admitting: Physician Assistant

## 2021-08-30 ENCOUNTER — Other Ambulatory Visit: Payer: Self-pay | Admitting: Physician Assistant

## 2021-08-30 DIAGNOSIS — J479 Bronchiectasis, uncomplicated: Secondary | ICD-10-CM

## 2021-08-30 DIAGNOSIS — M3500 Sicca syndrome, unspecified: Secondary | ICD-10-CM

## 2021-08-30 DIAGNOSIS — R5383 Other fatigue: Secondary | ICD-10-CM

## 2021-08-30 NOTE — Telephone Encounter (Signed)
Please see patient note and advise

## 2021-08-31 NOTE — Telephone Encounter (Signed)
Called pt and lvm regarding accomodation paperwork, have questions so we can complete paperwork. Advised to return my call today or tomorrow. Advised in the office daily from 7:30 am-3:30 pm

## 2021-09-01 ENCOUNTER — Telehealth: Payer: Self-pay | Admitting: Physician Assistant

## 2021-09-01 NOTE — Telephone Encounter (Signed)
Pt advised of referral for sleep study

## 2021-09-01 NOTE — Telephone Encounter (Signed)
Patient states: -She was returning [place] call to her in regards to a form that she had questions about  Patient requests: - Kela send her a Mychart message with any questions in the case she misses her call again.

## 2021-09-02 NOTE — Telephone Encounter (Signed)
Patient paperwork completed w/ patient 08/22/21 and faxed 09/02/21

## 2021-09-02 NOTE — Telephone Encounter (Signed)
Pt paperwork was completed 09/01/21 with patient and faxed 09/02/21.

## 2021-09-09 ENCOUNTER — Other Ambulatory Visit: Payer: Self-pay | Admitting: Physician Assistant

## 2021-09-09 DIAGNOSIS — J479 Bronchiectasis, uncomplicated: Secondary | ICD-10-CM

## 2021-09-09 DIAGNOSIS — R5383 Other fatigue: Secondary | ICD-10-CM

## 2021-09-09 NOTE — Telephone Encounter (Signed)
Patient would like accomodation forms to be sent to her home address.

## 2021-09-14 NOTE — Telephone Encounter (Signed)
Mailed copy of accomodation forms to patient per her request. Called pt to advise and apologize for delay in mailing. Pt verbalized understanding and was ok with delay just wanted a copy for her records without having to come in the office to pick up.

## 2021-09-17 NOTE — Telephone Encounter (Signed)
Caller States: -Needs clarification on two sections of the accommodation paperwork received. -pt will be given temporary accommodations in the meantime.  #1 of 2 Communiting Restrictions: Caller states: -how can they accomplish commuting restrictions, specifically for the days when pt is set to come in (twice a week) -What is the recommendation to restrict the commute to and from the office?  #2 of 2 Leaving the Office at [facility] (page 3 of BOA from Question 4) Caller States: -Accommodation listed is when in office, the patient should leave before dusk. -What is the intention for this request, specifically - how should they quantify dusk. -Because the length of days change, requesting something that can universally determined.

## 2021-09-21 NOTE — Telephone Encounter (Signed)
Returned Rob's call at [facility] for patient accomodation paperwork and clarifications and advised pt will need to work form home per Alyssa 3-4 days per week, also when commuting to the office patient needs to be home and not driving after dark. This cause increased stress and anxiety for patient and causes flare up and other issues. Rob verbalized understanding and stated no there information was needed at this time.

## 2021-09-24 LAB — COLOGUARD: COLOGUARD: NEGATIVE

## 2021-09-28 ENCOUNTER — Encounter: Payer: Self-pay | Admitting: Physician Assistant

## 2021-09-28 ENCOUNTER — Encounter (INDEPENDENT_AMBULATORY_CARE_PROVIDER_SITE_OTHER): Payer: Self-pay

## 2021-09-28 ENCOUNTER — Ambulatory Visit (INDEPENDENT_AMBULATORY_CARE_PROVIDER_SITE_OTHER): Payer: 59 | Admitting: Physician Assistant

## 2021-09-28 VITALS — BP 138/84 | HR 88 | Temp 97.3°F | Ht 64.0 in | Wt 191.2 lb

## 2021-09-28 DIAGNOSIS — M545 Low back pain, unspecified: Secondary | ICD-10-CM

## 2021-09-28 DIAGNOSIS — M05752 Rheumatoid arthritis with rheumatoid factor of left hip without organ or systems involvement: Secondary | ICD-10-CM | POA: Diagnosis not present

## 2021-09-28 DIAGNOSIS — M05751 Rheumatoid arthritis with rheumatoid factor of right hip without organ or systems involvement: Secondary | ICD-10-CM | POA: Diagnosis not present

## 2021-09-28 DIAGNOSIS — Z23 Encounter for immunization: Secondary | ICD-10-CM | POA: Diagnosis not present

## 2021-09-28 DIAGNOSIS — M3500 Sicca syndrome, unspecified: Secondary | ICD-10-CM | POA: Diagnosis not present

## 2021-09-28 DIAGNOSIS — G8929 Other chronic pain: Secondary | ICD-10-CM

## 2021-09-28 NOTE — Patient Instructions (Signed)
I think consult / evaluations with sports medicine and physical medicine could be beneficial to what you're looking for in terms of treatment for your pain. Water aerobics / therapy strongly recommended. Good diet / water intake habits.   Consider vitamin infusions. I'll look into this more as well.   Always great to see you!

## 2021-09-28 NOTE — Progress Notes (Signed)
Subjective:    Patient ID: [name], female    DOB: 1955-08-14, 66 y.o.   MRN: [id number]  Chief Complaint  Patient presents with   Follow-up    Pt in for f/u today and wants to discuss vitamin through IV infusion, sleep study scheduled on 9/21 and will do lung/pulmonary test the same time, was advised needing to see pain management; and see Ortho for back; pt needs Pap due to still having Cervix; Cologuard came back negative    HPI Patient is in today for follow-up. She has had consult with rheumatologist and cardiology completed. Sleep study is set up. PFT test is set up.   Pain management for sjogren's and RA- recommended by rheumatology, pt not sure if she wants to proceed with this referral or not. Hips and low back cause the most issues. Breathing deep makes spasms worse in back -Psychiatry previously had her on Trintellix & she said this did help for a short time -Also tried gabapentin, but didn't like it - made her mouth more dry  -Heating pads usually helps some; deep breaths; water therapy  -Tylenol Arthritis   Past Medical History:  Diagnosis Date   Anemia    history   Anxiety    Aortic atherosclerosis (HCC)    Arthritis    mild RA, secondary to Sjogren's    CAD (coronary artery disease)    Complication of anesthesia    Depression    history of, treated with med. & therapy   Fibromyalgia    GERD (gastroesophageal reflux disease)    Heart murmur 2010   told by Dr. Birdie Riddle - seen perhaps on ECHO?   Hiatal hernia    History of kidney stones    passed spontaneously   Hyperlipidemia    Hypertension    PONV (postoperative nausea and vomiting)    mild nausea post op   Rheumatoid arthritis(714.0)    Sjogren's syndrome (HCC)     Past Surgical History:  Procedure Laterality Date   ANTERIOR CERVICAL DECOMP/DISCECTOMY FUSION N/A 11/02/2017   Procedure: ANTERIOR CERVICAL DECOMPRESSION FUSION, CERVICAL 6-7 WITH INSTRUMENTATION AND ALLOGRAFT;  Surgeon: Phylliss Bob, MD;  Location: [facility];  Service: Orthopedics;  Laterality: N/A;   LEFT HEART CATH AND CORONARY ANGIOGRAPHY N/A 08/12/2021   Procedure: LEFT HEART CATH AND CORONARY ANGIOGRAPHY;  Surgeon: Belva Crome, MD;  Location: [facility];  Service: Cardiovascular;  Laterality: N/A;   OOPHORECTOMY     1 ovary remain, removed adhesions on bowel   REDUCTION MAMMAPLASTY Bilateral 11/2009   TONSILLECTOMY     born without tonsils    VESICOVAGINAL FISTULA CLOSURE W/ TAH      Family History  Problem Relation Age of Onset   Coronary artery disease Mother    Hypertension Mother    Diabetes Mother    Heart attack Father    Hypertension Father    CVA Father    Stroke Maternal Grandmother    Heart attack Paternal Grandmother    Breast cancer Maternal Aunt     Social History   Tobacco Use   Smoking status: Never   Smokeless tobacco: Never  Substance Use Topics   Alcohol use: No   Drug use: Never     Allergies  Allergen Reactions   Amitriptyline Swelling    Leg swelling    Latex Other (See Comments)    Burns skin   Lipitor [Atorvastatin] Itching    Review of Systems NEGATIVE UNLESS OTHERWISE INDICATED IN  HPI      Objective:     BP 138/84 (BP Location: Left Arm)   Pulse 88   Temp (!) 97.3 F (36.3 C) (Temporal)   Ht '5\' 4"'$  (1.626 m)   Wt 191 lb 3.2 oz (86.7 kg)   SpO2 99%   BMI 32.82 kg/m   Wt Readings from Last 3 Encounters:  09/28/21 191 lb 3.2 oz (86.7 kg)  08/20/21 189 lb (85.7 kg)  08/16/21 187 lb 12.8 oz (85.2 kg)    BP Readings from Last 3 Encounters:  09/28/21 138/84  08/20/21 138/70  08/16/21 134/86     Physical Exam Vitals and nursing note reviewed.  Constitutional:      Appearance: Normal appearance. She is normal weight. She is not toxic-appearing.  HENT:     Head: Normocephalic and atraumatic.  Eyes:     Extraocular Movements: Extraocular movements intact.     Conjunctiva/sclera: Conjunctivae normal.     Pupils: Pupils are equal,  round, and reactive to light.  Cardiovascular:     Rate and Rhythm: Normal rate and regular rhythm.     Pulses: Normal pulses.     Heart sounds: Normal heart sounds.  Pulmonary:     Effort: Pulmonary effort is normal.     Breath sounds: Normal breath sounds.  Musculoskeletal:        General: Normal range of motion.     Cervical back: Normal range of motion and neck supple.  Skin:    General: Skin is warm and dry.  Neurological:     General: No focal deficit present.     Mental Status: She is alert and oriented to person, place, and time.  Psychiatric:        Mood and Affect: Mood normal.        Behavior: Behavior normal.        Assessment & Plan:  SJOGREN'S SYNDROME -     Ambulatory referral to Sports Medicine -     Ambulatory referral to Physical Medicine Rehab  Rheumatoid arthritis involving both hips with positive rheumatoid factor ([facility]) -     Ambulatory referral to Sports Medicine -     Ambulatory referral to Physical Medicine Rehab  Chronic left-sided low back pain without sciatica -     Ambulatory referral to Sports Medicine -     Ambulatory referral to Physical Medicine Rehab  Need for immunization against influenza -     Flu Vaccine QUAD 92moIM (Fluarix, Fluzone & Alfiuria Quad PF)   Good discussion with patient today about possible treatments for her pain from rheumatoid arthritis and sjogren's.  I personally reviewed her most recent chart from rheumatology.  She is not sure if she wants to proceed with pain management at this time.  I talked with patient about consult with sports medicine and physical medicine first, as she would like to try a more holistic approach to treatment, and see what they might have to offer as far as help goes.  Water aerobics and physical therapy can certainly be helpful for her.  She does a good job with her nutrition and water intake.  She is also considering vitamin infusions with rheumatology.  Happy to hear she has been doing better  and will forward to seeing her back in about 6 months.     Return in about 6 months (around 03/29/2022) for recheck .  This note was prepared with assistance of [employer] analyst Occasional wrong-word or sound-a-like substitutions may have occurred  due to the inherent limitations of voice recognition software.  Time Spent: 35 minutes of total time was spent on the date of the encounter performing the following actions: chart review prior to seeing the patient, obtaining history, performing a medically necessary exam, counseling on the treatment plan, placing orders, and documenting in our EHR.       Marilla Boddy M Alwyn Cordner, PA-C

## 2021-09-30 ENCOUNTER — Encounter: Payer: Self-pay | Admitting: Physician Assistant

## 2021-09-30 NOTE — Progress Notes (Unsigned)
[name] D.[place] Phone: [phone]   Assessment and Plan:    1. Chronic bilateral low back pain without sciatica 2. SJOGREN'S SYNDROME 3. Rheumatoid arthritis involving both hips with positive rheumatoid factor (HCC)  - Chronic with exacerbation, initial sports medicine visit - Suspect pain in left groin primarily due to osteoarthritis with back pain as musculoskeletal compensation - X-ray obtained in clinic.  My interpretation: No acute fracture, vertebral collapse, dislocation.  Decreased joint space and acetabular spurring more prominent on right side compared to left.  Mild lumbar vertebral spurring. - With past medical history of rheumatoid arthritis and Sjogren's, I recommend that patient's start DMARD therapy to prevent progression of disease.  Recommend that she follow-up with rheumatology to further discuss - Encouraged patient to continue physical activity as well as water aerobics as this can help decrease and slow down disease process - May use NSAIDs as needed for pain flares - Start physical therapy - Start meloxicam 15 mg daily x2 weeks.  If still having pain after 2 weeks, complete 3rd-week of meloxicam. May use remaining meloxicam as needed once daily for pain control.  Do not to use additional NSAIDs while taking meloxicam.  May use Tylenol [phone] mg 2 to 3 times a day for breakthrough pain.    Pertinent previous records reviewed include primary care note 09/28/2021   Follow Up: 3 to 4 weeks for reevaluation.  Could consider intra-articular CSI based on patient's symptoms   Subjective:   I, [name], am serving as a Education administrator for Doctor [name]  Chief Complaint: bilateral hip and low back pain   HPI:   10/05/2021 Patient is a 66 year old female complaining of bilateral hip and low back pain. Patient states her groin area has been bothering her for a long time , 2018  fell on black ice , will ave numbness In her feet , and sometimes her feet become atrophied like she can walk , wants to get more mobile wants to be able to dance again , has a hard time turning in bed pain in her groin has pressure on her bladder    Relevant Historical Information: Rheumatoid arthritis, Sjogren's  Additional pertinent review of systems negative.   Current Outpatient Medications:    amLODipine (NORVASC) 2.5 MG tablet, Take 1 tablet (2.5 mg total) by mouth daily., Disp: 60 tablet, Rfl: 3   aspirin EC 81 MG tablet, Take 1 tablet (81 mg total) by mouth daily. Swallow whole., Disp: 90 tablet, Rfl: 3   lisinopril-hydrochlorothiazide (ZESTORETIC) 20-25 MG tablet, Take 1 tablet by mouth daily., Disp: 90 tablet, Rfl: 3   meloxicam (MOBIC) 15 MG tablet, Take 1 tablet (15 mg total) by mouth daily., Disp: 30 tablet, Rfl: 0  Current Facility-Administered Medications:    sodium chloride flush (NS) 0.9 % injection 3 mL, 3 mL, Intravenous, Q12H, Dunn, Dayna N, PA-C   Objective:     Vitals:   10/05/21 1544  BP: 136/86  Pulse: 83  SpO2: 100%  Weight: 191 lb (86.6 kg)  Height: '5\' 4"'$  (1.626 m)      Body mass index is 32.79 kg/m.    Physical Exam:    General: awake, alert, and oriented no acute distress, nontoxic Skin: no suspicious lesions or rashes Neuro:sensation intact distally with no dificits, normal muscle tone, no atrophy, strength 5/5 in all tested lower ext groups Psych: normal mood and affect, speech clear  Bilateral  hip: No deformity, swelling or wasting ROM Flexion 90, ext 30, IR 45, ER 45   Negative log roll with FROM Negative FABER Negative FADIR Positive piriformis test for gluteal tightness, though no radicular symptoms   Gait normal    Back - Normal skin, Spine with normal alignment and no deformity.   Moderate tenderness to lumbar vertebral   process palpation.   Bilateral lumbar paraspinous muscles are moderately tender and without spasm Straight  leg raise negative Trendelenberg negative    Electronically signed by:  [name] D.[name] 4:17 PM 10/05/21

## 2021-10-04 ENCOUNTER — Ambulatory Visit: Payer: 59

## 2021-10-05 ENCOUNTER — Ambulatory Visit: Payer: 59 | Admitting: Sports Medicine

## 2021-10-05 ENCOUNTER — Ambulatory Visit (INDEPENDENT_AMBULATORY_CARE_PROVIDER_SITE_OTHER): Payer: 59

## 2021-10-05 VITALS — BP 136/86 | HR 83 | Ht 64.0 in | Wt 191.0 lb

## 2021-10-05 DIAGNOSIS — M545 Low back pain, unspecified: Secondary | ICD-10-CM

## 2021-10-05 DIAGNOSIS — M05752 Rheumatoid arthritis with rheumatoid factor of left hip without organ or systems involvement: Secondary | ICD-10-CM

## 2021-10-05 DIAGNOSIS — M3500 Sicca syndrome, unspecified: Secondary | ICD-10-CM | POA: Diagnosis not present

## 2021-10-05 DIAGNOSIS — M05751 Rheumatoid arthritis with rheumatoid factor of right hip without organ or systems involvement: Secondary | ICD-10-CM | POA: Diagnosis not present

## 2021-10-05 DIAGNOSIS — G8929 Other chronic pain: Secondary | ICD-10-CM

## 2021-10-05 MED ORDER — MELOXICAM 15 MG PO TABS: 15.0000 mg | ORAL_TABLET | Freq: Every day | ORAL | 0 refills | Status: DC

## 2021-10-05 NOTE — Patient Instructions (Addendum)
Good to see you  PT referral  Discuss starting dmard with your RA  Back and hips HEP - Start meloxicam 15 mg daily x2 weeks.  If still having pain after 2 weeks, complete 3rd-week of meloxicam. May use remaining meloxicam as needed once daily for pain control.  Do not to use additional NSAIDs while taking meloxicam.  May use Tylenol [phone] mg 2 to 3 times a day for breakthrough pain. 3-4 week follow up

## 2021-10-06 ENCOUNTER — Telehealth: Payer: Self-pay | Admitting: Sports Medicine

## 2021-10-06 NOTE — Telephone Encounter (Signed)
Patient requesting PT referral be sent to [facility].

## 2021-10-06 NOTE — Telephone Encounter (Signed)
Faxed to requested location

## 2021-10-07 ENCOUNTER — Ambulatory Visit (INDEPENDENT_AMBULATORY_CARE_PROVIDER_SITE_OTHER): Payer: 59 | Admitting: Adult Health

## 2021-10-07 ENCOUNTER — Encounter: Payer: Self-pay | Admitting: Adult Health

## 2021-10-07 ENCOUNTER — Other Ambulatory Visit: Payer: Self-pay

## 2021-10-07 DIAGNOSIS — R400 Somnolence: Secondary | ICD-10-CM

## 2021-10-07 DIAGNOSIS — R059 Cough, unspecified: Secondary | ICD-10-CM | POA: Diagnosis not present

## 2021-10-07 DIAGNOSIS — R0683 Snoring: Secondary | ICD-10-CM

## 2021-10-07 NOTE — Assessment & Plan Note (Signed)
Daytime sleepiness, restless sleep, insomnia all discerning for underlying sleep apnea.  We discussed in detail healthy sleep regimen.  Recommended changes to her current sleep pattern. We will set up for home sleep study rule out sleep apnea.  - discussed how weight can impact sleep and risk for sleep disordered breathing - discussed options to assist with weight loss: combination of diet modification, cardiovascular and strength training exercises   - had an extensive discussion regarding the adverse health consequences related to untreated sleep disordered breathing - specifically discussed the risks for hypertension, coronary artery disease, cardiac dysrhythmias, cerebrovascular disease, and diabetes - lifestyle modification discussed   - discussed how sleep disruption can increase risk of accidents, particularly when driving - safe driving practices were discussed   Plan  Patient Instructions  Set up for home sleep study  Healthy sleep regimen  Do not drive if sleepy  Activity as tolerated.   Follow up in 2 months with PFT to review sleep study results and treatment plan

## 2021-10-07 NOTE — Patient Instructions (Signed)
Set up for home sleep study  Healthy sleep regimen  Do not drive if sleepy  Activity as tolerated.   Follow up in 2 months with PFT to review sleep study results and treatment plan

## 2021-10-07 NOTE — Progress Notes (Signed)
$'@Patient'w$  ID: [name], female    DOB: 09/09/1955, 66 y.o.   MRN: [id number]  Chief Complaint  Patient presents with   Consult    Referring provider: Allwardt, Nicholes Rough  HPI: 66 year old female seen for sleep and pulmonary consult October 07, 2021 for restless sleep , daytime sleepiness, insomnia  and Bronchiectasis  Medical history significant for Sjogrens , RA  and Fibromyalgia   TEST/EVENTS :   10/07/2021 Consult  Patient presents for sleep and pulmonary consult today.  Kindly referred by primary care provider Allwardt, Randa Evens, PA-C.  Patient complains of restless sleep, insomnia, daytime sleepiness . Typically goes to bed about 8:30 PM.  Takes up to 2-3 hours to go to sleep.  Gets up multiple times throughout the night.  Only sleeps for 2-3 hr increments. Tosses and turns throughout the night . Gets up about 5 AM.  Never had a sleep study before.  Occasionally takes a nap.  No history of stroke.   has dentures . Caffeine intake minimal .  Has grade 2 diastolic dysfunction.  Patient has tried sleep aids in the past without any significant perceived benefit and does not like the side effects.  Epworth score 2 out of 24.  Typically gets sleepy if she sits down to watch TV and in the afternoon hours.  Patient says she was diagnosed with mild bronchiectasis in the past.  She has underlying connective tissue disorder with Sjogren's and rheumatoid arthritis.  Also fibromyalgia.  Patient says she has chronic pain.  Is not on any type of maintenance regimen.  Says she has some occasional minimal cough.  She is on ACE inhibitor.  She denies any fever, chest pain, orthopnea, edema.  Patient says she has been told that she needs to get a pulmonary function test.  She says she gets winded with heavy activities but remains very active.  She works full-time.  She says she has coughed up mucus intermittently in the past and is occasionally seeing some traces of blood.  Had coronary CT,  pulmonary portion showed no acute process.     10/07/2021    4:00 PM  Results of the Epworth flowsheet  Sitting and reading 0  Watching TV 1  Sitting, inactive in a public place (e.g. a theatre or a meeting) 0  As a passenger in a car for an hour without a break 0  Lying down to rest in the afternoon when circumstances permit 1  Sitting and talking to someone 0  Sitting quietly after a lunch without alcohol 0  In a car, while stopped for a few minutes in traffic 0  Total score 2    Social history patient is married she lives with her husband at home.  Works for [employer].  Family history positive for heart disease and breast cancer.  Surgical history positive for hysterectomy.  Medical history positive for Sjogren's syndrome, hiatal hernia, hypertension, rheumatoid arthritis  Allergies  Allergen Reactions   Amitriptyline Swelling    Leg swelling    Latex Other (See Comments)    Burns skin   Lipitor [Atorvastatin] Itching    Immunization History  Administered Date(s) Administered   Influenza Split 09/18/2010   Influenza,inj,Quad PF,6+ Mos 09/28/2021   PFIZER(Purple Top)SARS-COV-2 Vaccination 04/08/2019, 04/29/2019, 01/08/2020    Past Medical History:  Diagnosis Date   Anemia    history   Anxiety    Aortic atherosclerosis (HCC)    Arthritis    mild RA, secondary  to Sjogren's    CAD (coronary artery disease)    Complication of anesthesia    Depression    history of, treated with med. & therapy   Fibromyalgia    GERD (gastroesophageal reflux disease)    Heart murmur 2010   told by Dr. Birdie Riddle - seen perhaps on ECHO?   Hiatal hernia    History of kidney stones    passed spontaneously   Hyperlipidemia    Hypertension    PONV (postoperative nausea and vomiting)    mild nausea post op   Rheumatoid arthritis(714.0)    Sjogren's syndrome (HCC)     Tobacco History: Social History   Tobacco Use  Smoking Status Never   Passive exposure: Never   Smokeless Tobacco Never   Counseling given: Not Answered   Outpatient Medications Prior to Visit  Medication Sig Dispense Refill   amLODipine (NORVASC) 2.5 MG tablet Take 1 tablet (2.5 mg total) by mouth daily. 60 tablet 3   aspirin EC 81 MG tablet Take 1 tablet (81 mg total) by mouth daily. Swallow whole. 90 tablet 3   lisinopril-hydrochlorothiazide (ZESTORETIC) 20-25 MG tablet Take 1 tablet by mouth daily. 90 tablet 3   meloxicam (MOBIC) 15 MG tablet Take 1 tablet (15 mg total) by mouth daily. 30 tablet 0   Facility-Administered Medications Prior to Visit  Medication Dose Route Frequency Provider Last Rate Last Admin   sodium chloride flush (NS) 0.9 % injection 3 mL  3 mL Intravenous Q12H Dunn, Dayna N, PA-C         Review of Systems:   Constitutional:   No  weight loss, night sweats,  Fevers, chills, fatigue, or  lassitude.  HEENT:   No headaches,  Difficulty swallowing,  Tooth/dental problems, or  Sore throat,                No sneezing, itching, ear ache, nasal congestion, post nasal drip,   CV:  No chest pain,  Orthopnea, PND, swelling in lower extremities, anasarca, dizziness, palpitations, syncope.   GI  No heartburn, indigestion, abdominal pain, nausea, vomiting, diarrhea, change in bowel habits, loss of appetite, bloody stools.   Resp: No shortness of breath with exertion or at rest.  No excess mucus, no productive cough,  No non-productive cough,  No coughing up of blood.  No change in color of mucus.  No wheezing.  No chest wall deformity  Skin: no rash or lesions.  GU: no dysuria, change in color of urine, no urgency or frequency.  No flank pain, no hematuria   MS:  No joint pain or swelling.  No decreased range of motion.  No back pain.    Physical Exam  BP 134/78 (BP Location: Left Arm, Patient Position: Sitting, Cuff Size: Normal)   Pulse 72   Temp 98 F (36.7 C) (Oral)   Ht '5\' 4"'$  (1.626 m)   Wt 194 lb 3.2 oz (88.1 kg)   SpO2 100%   BMI 33.33 kg/m    GEN: A/Ox3; pleasant , NAD, well nourished    HEENT:  [place]/AT,   NOSE-clear, THROAT-clear, no lesions, no postnasal drip or exudate noted.  Class III MP airway  NECK:  Supple w/ fair ROM; no JVD; normal carotid impulses w/o bruits; no thyromegaly or nodules palpated; no lymphadenopathy.    RESP  Clear  P & A; w/o, wheezes/ rales/ or rhonchi. no accessory muscle use, no dullness to percussion  CARD:  RRR, no m/r/g, no peripheral edema, pulses intact,  no cyanosis or clubbing.  GI:   Soft & nt; nml bowel sounds; no organomegaly or masses detected.   Musco: Warm bil, no deformities or joint swelling noted.   Neuro: alert, no focal deficits noted.    Skin: Warm, no lesions or rashes    Lab Results:      BNP No results found for: "BNP"  ProBNP No results found for: "PROBNP"  Imaging:        No data to display          No results found for: "NITRICOXIDE"      Assessment & Plan:   Daytime sleepiness Daytime sleepiness, restless sleep, insomnia all discerning for underlying sleep apnea.  We discussed in detail healthy sleep regimen.  Recommended changes to her current sleep pattern. We will set up for home sleep study rule out sleep apnea.  - discussed how weight can impact sleep and risk for sleep disordered breathing - discussed options to assist with weight loss: combination of diet modification, cardiovascular and strength training exercises   - had an extensive discussion regarding the adverse health consequences related to untreated sleep disordered breathing - specifically discussed the risks for hypertension, coronary artery disease, cardiac dysrhythmias, cerebrovascular disease, and diabetes - lifestyle modification discussed   - discussed how sleep disruption can increase risk of accidents, particularly when driving - safe driving practices were discussed   Plan  Patient Instructions  Set up for home sleep study  Healthy sleep regimen  Do not  drive if sleepy  Activity as tolerated.   Follow up in 2 months with PFT to review sleep study results and treatment plan       Cough Mild intermittent cough.  She has underlying Sjogren's and rheumatoid arthritis.  Recent CT chest in July showed no acute process noted on the pulmonary portion We will set patient up for PFTs and review on return visit. Patient is on ACE inhibitor.  Cough does not seem to be very troublesome.     Rexene Edison, NP 10/07/2021

## 2021-10-07 NOTE — Assessment & Plan Note (Signed)
Mild intermittent cough.  She has underlying Sjogren's and rheumatoid arthritis.  Recent CT chest in July showed no acute process noted on the pulmonary portion We will set patient up for PFTs and review on return visit. Patient is on ACE inhibitor.  Cough does not seem to be very troublesome.

## 2021-10-08 NOTE — Addendum Note (Signed)
Addended by: Vanessa Barbara on: 10/08/2021 05:15 PM   Modules accepted: Orders

## 2021-10-11 ENCOUNTER — Other Ambulatory Visit: Payer: Self-pay | Admitting: *Deleted

## 2021-11-02 ENCOUNTER — Ambulatory Visit: Payer: 59 | Admitting: Sports Medicine

## 2021-11-25 ENCOUNTER — Ambulatory Visit: Payer: 59 | Admitting: Sports Medicine

## 2021-11-29 ENCOUNTER — Telehealth: Payer: Self-pay | Admitting: Primary Care

## 2021-12-01 NOTE — Telephone Encounter (Signed)
Left vm for pt to call me for hst

## 2021-12-02 ENCOUNTER — Ambulatory Visit: Payer: 59 | Admitting: Adult Health

## 2021-12-07 NOTE — Progress Notes (Unsigned)
    [name] D.[place] Phone: [phone]   Assessment and Plan:     There are no diagnoses linked to this encounter.  ***   Pertinent previous records reviewed include ***   Follow Up: ***     Subjective:   I, [name], am serving as a Education administrator for [name]   Chief Complaint: bilateral hip and low back pain    HPI:    10/05/2021 Patient is a 66 year old female complaining of bilateral hip and low back pain. Patient states her groin area has been bothering her for a long time , 2018 fell on black ice , will ave numbness In her feet , and sometimes her feet become atrophied like she can walk , wants to get more mobile wants to be able to dance again , has a hard time turning in bed pain in her groin has pressure on her bladder    12/08/2021 Patient states    Relevant Historical Information: Rheumatoid arthritis, Sjogren's  Additional pertinent review of systems negative.   Current Outpatient Medications:    amLODipine (NORVASC) 2.5 MG tablet, Take 1 tablet (2.5 mg total) by mouth daily., Disp: 60 tablet, Rfl: 3   aspirin EC 81 MG tablet, Take 1 tablet (81 mg total) by mouth daily. Swallow whole., Disp: 90 tablet, Rfl: 3   lisinopril-hydrochlorothiazide (ZESTORETIC) 20-25 MG tablet, Take 1 tablet by mouth daily., Disp: 90 tablet, Rfl: 3   meloxicam (MOBIC) 15 MG tablet, Take 1 tablet (15 mg total) by mouth daily., Disp: 30 tablet, Rfl: 0  Current Facility-Administered Medications:    sodium chloride flush (NS) 0.9 % injection 3 mL, 3 mL, Intravenous, Q12H, Dunn, Dayna N, PA-C   Objective:     There were no vitals filed for this visit.    There is no height or weight on file to calculate BMI.    Physical Exam:    ***   Electronically signed by:  [name] D.[facility] 11:49 AM 12/07/21

## 2021-12-08 ENCOUNTER — Ambulatory Visit (INDEPENDENT_AMBULATORY_CARE_PROVIDER_SITE_OTHER): Payer: 59 | Admitting: Sports Medicine

## 2021-12-08 VITALS — BP 138/88 | HR 88 | Ht 64.0 in | Wt 194.0 lb

## 2021-12-08 DIAGNOSIS — M545 Low back pain, unspecified: Secondary | ICD-10-CM | POA: Diagnosis not present

## 2021-12-08 DIAGNOSIS — M05752 Rheumatoid arthritis with rheumatoid factor of left hip without organ or systems involvement: Secondary | ICD-10-CM

## 2021-12-08 DIAGNOSIS — F40240 Claustrophobia: Secondary | ICD-10-CM | POA: Diagnosis not present

## 2021-12-08 DIAGNOSIS — M05751 Rheumatoid arthritis with rheumatoid factor of right hip without organ or systems involvement: Secondary | ICD-10-CM

## 2021-12-08 DIAGNOSIS — M3500 Sicca syndrome, unspecified: Secondary | ICD-10-CM

## 2021-12-08 DIAGNOSIS — G8929 Other chronic pain: Secondary | ICD-10-CM

## 2021-12-08 NOTE — Patient Instructions (Addendum)
Good to see you  MRI lumbar  Discontinue meloxicam  Follow up 3 days after MRI to discuss results

## 2021-12-13 ENCOUNTER — Encounter: Payer: Self-pay | Admitting: Adult Health

## 2021-12-13 ENCOUNTER — Encounter: Payer: Self-pay | Admitting: *Deleted

## 2021-12-13 NOTE — Progress Notes (Unsigned)
Cardiology Office Note    Date:  12/15/2021   ID:  [name], DOB 12/20/1955, MRN [id number]  PCP:  Fredirick Lathe, PA-C  Cardiologist:  Dorris Carnes, MD  Electrophysiologist:  None   Chief Complaint: f/u CAD  History of Present Illness:   [name] is a 66 y.o. female with history of  HTN, GERD, HLD, RA, anxiety, anemia, fibromyalgia, heart murmur, Sjogren's syndrome, hiatal hernia, aortic atherosclerosis, intermittently elevated LFTs on labs, and moderate CAD by recent cath who is seen for follow-up.   She established care with Dr. Harrington Challenger in 06/2021 for episodes of atypical chest pain and DOE. Of the two symptoms, the DOE was considered more concerning. 2D echo 07/21/21 EF 60-65% mild LVH, G1DD, mild calcification of AV, no significant valve disease. Coronary CTA was performed 08/03/21 with full results below, CAC 245 (91%ile), concerning for stenosis in mid LAD/D1, as well as moderate-large hiatal hernia and borderline enlarged right axillary lymph node is identified measuring 1.1 cm. FFR was positive in small D1 and distal LAD. She was encouraged to f/u PCP for the ancillary findings. Dr. Harrington Challenger had recommended to proceed with cath. Cath 08/12/21 showed 40-60% mid-to-distal LAD overlapping a large second diagonal, no significant obstructive disease otherwise, LVEDP normal. Dr. Tamala Julian recommended anti-ischemic therapy and if residual uncontrollable symptoms, consider stenting of LAD. Unrelated to the above history, she remotely saw heme-onc in 2013 for hypergammaglobulinemia felt related to her rheumatologic disease at the time. Repeat SPEP 2019 did not show an M Spike either. Her Tprot has been persistently elevated for over 10 years. At her f/u 08/20/21, amlodipine was added and she was referred to lipid clinic for statin allergy.   She is seen back for follow-up reporting she has been largely stable. She is still having periods of intermittent atypical chest pains similar to prior visit  without specific provocation, lasting varying amounts of time. It is not exacerbated by exertion or activity. Chronic DOE has not progressed. Overall she thinks she's doing well from a heart standpoint. She denies any significant change from last visit. She stopped amlodipine as she felt terrible with this with muscle cramps and feeling dehydrated. She went on to have a lymph node biopsy that she reports is benign but has also been following with various specialists for her bronchiectasis and orthopedic issues. She does not wish to revisit any additional medication therapy at this time.    Labwork independently reviewed: 08/09/21 Hgb 11.0, plt 251, K 4.2, Cr 0.73, AST/ALT OK, TProt 8.8 (advised to f/u PCP for elev protein of unclear significance) 06/2021 Mg 2.1, A1C 6.2, TSH wnl, LDL 118, trig 69, Na 134, K 4.0, Cr 0.81, AST/ALT 55/39, TProt 9.3, albumin 3.8, Hgb 10.9 (prev 10 in 05/2021, 13 in 03/2020), plt 238   Cardiology Studies:   Studies reviewed are outlined and summarized above. Reports included below if pertinent.   LHC 08/12/21 CONCLUSIONS: 40 to 60% mid to distal LAD overlapping a large second diagonal.  CT FFR was positive in the apical LAD 0.75. No significant obstructive disease otherwise in a patient with a left dominant system. LVEDP normal.  Grade 2 diastolic dysfunction on echo.   RECOMMENDATIONS:   Patient should be started on preventive and anti-ischemic therapy.  Recommend statin, beta-blocker or negative inotropic calcium channel blocker, aerobic activity, and manage LAD with stenting if uncontrollable symptoms. Based on echo the patient has grade 2 diastolic dysfunction and should be considered for SGLT2 therapy as much for  symptoms could be related to diastolic heart failure although no signal towards that based upon normal EDP.   Cor CT 08/03/21   TECHNIQUE: The patient was scanned on a Siemens Force 540 slice scanner. Gantry rotation speed was 250 msecs. Collimation was  .6 mm. A 100 kV prospective scan was triggered in the ascending thoracic aorta at 140 HU's Full mA was used between 35% and 75% of the R-R interval. Average HR during the scan was 63 bpm. The 3D data set was interpreted on a dedicated work station using MPR, MIP and VRT modes. A total of 80cc of contrast was used.   FINDINGS: Non-cardiac: See separate report from [facility]. No significant findings on limited lung and soft tissue windows.   Calcium Score: Calcium noted in LM, LAD, LCX   LM: 14.6   LAD 190   LCX 40.9   RCA 0   Total 245 which is 91 st percentile   Coronary Arteries: Left  dominant with no anomalies   LM: 1-25% calcified plaque   LAD: 50-69% mixed plaque in mid vessel beginning at D2 take off 1-24% calcified plaque distally   D1: 50-69% calcified plaque   D2: Normal see above ostium involved with LAD lesion   Circumflex: 1-24% proximal / mid vessel calcified plaque   OM1: Normal   OM2: Normal   PDA: Normal   PLB: Normal   RCA: Small non dominant normal   IMPRESSION: 1 calcium score 245 which is 91 st percentile for age/sex   2.  CAD RADS 3 CAD concerning in mid LAD/D1 Study sent for FFR   3.  Normal ascending thoracic aorta 3.2 cm   4.  Left dominant coronary arteries   5.  Significant hiatal hernia may be cause of patients symptoms   [name]   Electronically Signed: By: [name] M.D. On: 08/03/2021 09:53   2D Echo 07/21/21    1. Left ventricular ejection fraction, by estimation, is 60 to 65%. The  left ventricle has normal function. The left ventricle has no regional  wall motion abnormalities. There is mild left ventricular hypertrophy.  Left ventricular diastolic parameters  are consistent with Grade II diastolic dysfunction (pseudonormalization).   2. Right ventricular systolic function is normal. The right ventricular  size is normal. Tricuspid regurgitation signal is inadequate for assessing  PA pressure.    3. The mitral valve is normal in structure. Trivial mitral valve  regurgitation. No evidence of mitral stenosis.   4. The aortic valve is tricuspid. There is mild calcification of the  aortic valve. Aortic valve regurgitation is not visualized. No aortic  stenosis is present.   5. The inferior vena cava is normal in size with greater than 50%  respiratory variability, suggesting right atrial pressure of 3 mmHg.      TECHNIQUE: The best systolic and diastolic phases of patients gated cardiac CTA sent to Heart Flow for analysis   FINDINGS: FFR CT normal in non dominant RCA and dominant LCX   FFR CT borderline in mid LAD 0.86 positive in distal LAD 0.73   FFR CT positive in D1 small vessel 0.72   FFR CT borderline in D2 0.85   IMPRESSION: FFR positive in small D1 and distal LAD can consider medical Rx or cath depending on symptoms   [name]     Electronically Signed   By: [name] M.D.   On: 08/03/2021 10:12   COMPARISON:  CT 10/20/2008   FINDINGS: Vascular:  No acute abnormality.   Mediastinum/nodes: Borderline enlarged right axillary lymph node is identified measuring 1.1 cm. No mediastinal mass or adenopathy identified.   Lungs/pleura: No pleural effusion or airspace consolidation. No suspicious lung nodules.   Upper abdomen: Moderate to large size hiatal hernia. No acute findings.   Musculoskeletal: No acute or suspicious findings.   IMPRESSION: 1. No active cardiopulmonary abnormalities. 2. Moderate to large size hiatal hernia.     Electronically Signed   By: Kerby Moors M.D.   On: 08/03/2021 11:18    Past Medical History:  Diagnosis Date   Anemia    history   Anxiety    Aortic atherosclerosis (HCC)    Arthritis    mild RA, secondary to Sjogren's    CAD (coronary artery disease)    Complication of anesthesia    Depression    history of, treated with med. & therapy   Fibromyalgia    GERD (gastroesophageal reflux disease)     Heart murmur 2010   told by Dr. Birdie Riddle - seen perhaps on ECHO?   Hiatal hernia    History of kidney stones    passed spontaneously   Hyperlipidemia    Hypertension    PONV (postoperative nausea and vomiting)    mild nausea post op   Rheumatoid arthritis(714.0)    Sjogren's syndrome (HCC)     Past Surgical History:  Procedure Laterality Date   ANTERIOR CERVICAL DECOMP/DISCECTOMY FUSION N/A 11/02/2017   Procedure: ANTERIOR CERVICAL DECOMPRESSION FUSION, CERVICAL 6-7 WITH INSTRUMENTATION AND ALLOGRAFT;  Surgeon: Phylliss Bob, MD;  Location: [place];  Service: Orthopedics;  Laterality: N/A;   LEFT HEART CATH AND CORONARY ANGIOGRAPHY N/A 08/12/2021   Procedure: LEFT HEART CATH AND CORONARY ANGIOGRAPHY;  Surgeon: Belva Crome, MD;  Location: [place] [facility];  Service: Cardiovascular;  Laterality: N/A;   OOPHORECTOMY     1 ovary remain, removed adhesions on bowel   REDUCTION MAMMAPLASTY Bilateral 11/2009   TONSILLECTOMY     born without tonsils    VESICOVAGINAL FISTULA CLOSURE W/ TAH      Current Medications: Current Meds  Medication Sig   aspirin EC 81 MG tablet Take 1 tablet (81 mg total) by mouth daily. Swallow whole.   lisinopril-hydrochlorothiazide (ZESTORETIC) 20-25 MG tablet Take 1 tablet by mouth daily.    Allergies:   Amitriptyline, Latex, and Lipitor [atorvastatin]   Social History   Socioeconomic History   Marital status: Married    Spouse name: Not on file   Number of children: 1   Years of education: Not on file   Highest education level: Not on file  Occupational History    Comment: [employer]    Employer: [employer]  Tobacco Use   Smoking status: Never    Passive exposure: Never   Smokeless tobacco: Never  Substance and Sexual Activity   Alcohol use: No   Drug use: Never   Sexual activity: Not on file  Other Topics Concern   Not on file  Social History Narrative   Not on file   Social Determinants of Health   Financial Resource  Strain: Not on file  Food Insecurity: Not on file  Transportation Needs: Not on file  Physical Activity: Not on file  Stress: Not on file  Social Connections: Not on file     Family History:  The patient's family history includes Breast cancer in her maternal aunt; CVA in her father; Coronary artery disease in her mother; Diabetes in her  mother; Heart attack in her father and paternal grandmother; Hypertension in her father and mother; Stroke in her maternal grandmother.  ROS:   Please see the history of present illness.  All other systems are reviewed and otherwise negative.    EKG(s)/Additional Labs   EKG:  EKG is ordered today, personally reviewed, demonstrating NSR 89bpm, minima voltage criteria for LVH, nonspecific STTW changes similar to prior.  Recent Labs: 07/01/2021: Magnesium 2.1; TSH 1.62 08/09/2021: ALT 30; BUN 14; Creatinine, Ser 0.73; Hemoglobin 11.0; Platelets 251; Potassium 4.2; Sodium 136  Recent Lipid Panel    Component Value Date/Time   CHOL 189 07/01/2021 1339   TRIG 69.0 07/01/2021 1339   HDL 57.40 07/01/2021 1339   CHOLHDL 3 07/01/2021 1339   VLDL 13.8 07/01/2021 1339   LDLCALC 118 (H) 07/01/2021 1339   LDLDIRECT 162.7 03/24/2010 0921    PHYSICAL EXAM:    VS:  BP 124/68   Pulse 96   Ht '5\' 4"'$  (1.626 m)   Wt 189 lb 6.4 oz (85.9 kg)   SpO2 98%   BMI 32.51 kg/m   BMI: Body mass index is 32.51 kg/m.  GEN: Well nourished, well developed female in no acute distress HEENT: normocephalic, atraumatic Neck: no JVD, carotid bruits, or masses Cardiac: RRR; no murmurs, rubs, or gallops, no edema  Respiratory:  clear to auscultation bilaterally, normal work of breathing GI: soft, nontender, nondistended, + BS MS: no deformity or atrophy Skin: warm and dry, no rash Neuro:  Alert and Oriented x 3, Strength and sensation are intact, follows commands Psych: euthymic mood, full affect  Wt Readings from Last 3 Encounters:  12/15/21 189 lb 6.4 oz (85.9 kg)   12/08/21 194 lb (88 kg)  10/07/21 194 lb 3.2 oz (88.1 kg)     ASSESSMENT & PLAN:   1. CAD, HLD - she continues to have episodic atypical chest pains that have not historically sounded cardiac in nature. She denies any acute change in the nature of this. EKG appears similar to prior. We added amlodipine last visit but she did not tolerate due to reported muscle cramping and feeling dehydrated. We discussed increasing medical therapy perhaps to trial beta blocker but she is not interested in adding any additional medicines at this time. For this reason she also wishes to defer pharmD lipid clinic evaluation. She will let us know if she changes her mind or if anything else changes. Continue ASA.  2. Diastolic dysfunction without heart failure - appears euvolemic on exam. BP stable.  3. Essential HTN - BP controlled, no changes in regimen made today. Took amlodipine off med list.  4. Heart murmur - not apparent on exam today. Echo showed mild calcification of her AV otherwise trivial MR, no significant valve disease. Follow clinically.     Disposition: F/u with Dr. Harrington Challenger or myself in 6 months, sooner if needed.   Medication Adjustments/Labs and Tests Ordered: Current medicines are reviewed at length with the patient today.  Concerns regarding medicines are outlined above. Medication changes, Labs and Tests ordered today are summarized above and listed in the Patient Instructions accessible in Encounters.   Signed, Charlie Pitter, PA-C  12/15/2021 4:19 PM    [place] Phone: [phone]; Fax: [phone]

## 2021-12-13 NOTE — Telephone Encounter (Signed)
Left another vm for pt to call me to set up hst.  Mailed letter today.  Will close out message.

## 2021-12-15 ENCOUNTER — Encounter: Payer: Self-pay | Admitting: Physician Assistant

## 2021-12-15 ENCOUNTER — Ambulatory Visit: Payer: 59 | Attending: Physician Assistant | Admitting: Physician Assistant

## 2021-12-15 VITALS — BP 124/68 | HR 96 | Ht 64.0 in | Wt 189.4 lb

## 2021-12-15 DIAGNOSIS — I2510 Atherosclerotic heart disease of native coronary artery without angina pectoris: Secondary | ICD-10-CM

## 2021-12-15 DIAGNOSIS — R011 Cardiac murmur, unspecified: Secondary | ICD-10-CM

## 2021-12-15 DIAGNOSIS — E785 Hyperlipidemia, unspecified: Secondary | ICD-10-CM

## 2021-12-15 DIAGNOSIS — I5189 Other ill-defined heart diseases: Secondary | ICD-10-CM | POA: Diagnosis not present

## 2021-12-15 DIAGNOSIS — I10 Essential (primary) hypertension: Secondary | ICD-10-CM

## 2021-12-15 NOTE — Patient Instructions (Signed)
Medication Instructions:  Continue to stay off amlodipine *If you need a refill on your cardiac medications before your next appointment, please call your pharmacy*   Lab Work: none If you have labs (blood work) drawn today and your tests are completely normal, you will receive your results only by: [place] (if you have MyChart) OR A paper copy in the mail If you have any lab test that is abnormal or we need to change your treatment, we will call you to review the results.   Testing/Procedures: none   Follow-Up: At [facility], you and your health needs are our priority.  As part of our continuing mission to provide you with exceptional heart care, we have created designated Provider Care Teams.  These Care Teams include your primary Cardiologist (physician) and Advanced Practice Providers (APPs -  Physician Assistants and Nurse Practitioners) who all work together to provide you with the care you need, when you need it.  We recommend signing up for the patient portal called "MyChart".  Sign up information is provided on this After Visit Summary.  MyChart is used to connect with patients for Virtual Visits (Telemedicine).  Patients are able to view lab/test results, encounter notes, upcoming appointments, etc.  Non-urgent messages can be sent to your provider as well.   To learn more about what you can do with MyChart, go to [email or web address].    Your next appointment:   6 month(s)  The format for your next appointment:   In Person  Provider:   Melina Copa, PA-C         Other Instructions    Important Information About Sugar

## 2021-12-23 ENCOUNTER — Ambulatory Visit: Payer: 59 | Admitting: Physician Assistant

## 2021-12-23 ENCOUNTER — Encounter: Payer: Self-pay | Admitting: Physician Assistant

## 2021-12-23 VITALS — BP 130/82 | HR 75 | Temp 97.8°F | Ht 64.0 in | Wt 190.0 lb

## 2021-12-23 DIAGNOSIS — R42 Dizziness and giddiness: Secondary | ICD-10-CM | POA: Diagnosis not present

## 2021-12-23 DIAGNOSIS — R519 Headache, unspecified: Secondary | ICD-10-CM

## 2021-12-23 DIAGNOSIS — R290 Tetany: Secondary | ICD-10-CM

## 2021-12-23 DIAGNOSIS — R252 Cramp and spasm: Secondary | ICD-10-CM | POA: Diagnosis not present

## 2021-12-23 DIAGNOSIS — R779 Abnormality of plasma protein, unspecified: Secondary | ICD-10-CM

## 2021-12-23 DIAGNOSIS — M05751 Rheumatoid arthritis with rheumatoid factor of right hip without organ or systems involvement: Secondary | ICD-10-CM

## 2021-12-23 DIAGNOSIS — M05752 Rheumatoid arthritis with rheumatoid factor of left hip without organ or systems involvement: Secondary | ICD-10-CM

## 2021-12-23 DIAGNOSIS — M797 Fibromyalgia: Secondary | ICD-10-CM

## 2021-12-23 LAB — COMPREHENSIVE METABOLIC PANEL
ALT: 30 U/L (ref 0–35)
AST: 34 U/L (ref 0–37)
Albumin: 3.8 g/dL (ref 3.5–5.2)
Alkaline Phosphatase: 81 U/L (ref 39–117)
BUN: 12 mg/dL (ref 6–23)
CO2: 28 mEq/L (ref 19–32)
Calcium: 9.4 mg/dL (ref 8.4–10.5)
Chloride: 102 mEq/L (ref 96–112)
Creatinine, Ser: 0.77 mg/dL (ref 0.40–1.20)
GFR: 81.49 mL/min (ref >60.00)
Glucose, Bld: 92 mg/dL (ref 70–99)
Potassium: 4.2 mEq/L (ref 3.5–5.1)
Sodium: 137 mEq/L (ref 135–145)
Total Bilirubin: 0.3 mg/dL (ref 0.2–1.2)
Total Protein: 8.7 g/dL — ABNORMAL HIGH (ref 6.0–8.3)

## 2021-12-23 LAB — CBC WITH DIFFERENTIAL/PLATELET
Basophils Absolute: 0.1 10*3/uL (ref 0.0–0.1)
Basophils Relative: 1.3 % (ref 0.0–3.0)
Eosinophils Absolute: 0 10*3/uL (ref 0.0–0.7)
Eosinophils Relative: 0.7 % (ref 0.0–5.0)
HCT: 33.1 % — ABNORMAL LOW (ref 36.0–46.0)
Hemoglobin: 10.7 g/dL — ABNORMAL LOW (ref 12.0–15.0)
Lymphocytes Relative: 18.6 % (ref 12.0–46.0)
Lymphs Abs: 1.1 10*3/uL (ref 0.7–4.0)
MCHC: 32.5 g/dL (ref 30.0–36.0)
MCV: 81.5 fl (ref 78.0–100.0)
Monocytes Absolute: 0.7 10*3/uL (ref 0.1–1.0)
Monocytes Relative: 11.9 % (ref 3.0–12.0)
Neutro Abs: 4.1 10*3/uL (ref 1.4–7.7)
Neutrophils Relative %: 67.5 % (ref 43.0–77.0)
Platelets: 263 10*3/uL (ref 150.0–400.0)
RBC: 4.06 Mil/uL (ref 3.87–5.11)
RDW: 14 % (ref 11.5–15.5)
WBC: 6 10*3/uL (ref 4.0–10.5)

## 2021-12-23 LAB — MAGNESIUM: Magnesium: 2 mg/dL (ref 1.5–2.5)

## 2021-12-23 NOTE — Progress Notes (Signed)
Subjective:    Patient ID: [name], female    DOB: Jun 16, 1955, 66 y.o.   MRN: [id number]  Chief Complaint  Patient presents with   Follow-up    HPI Patient is in today for workplace accommodations.   Monday - pain from R shoulder, down her arm, couldn't move it well; took Advil and heating pad; went to rheumatology and said pain is from fibromyalgia. Pain management was strongly advised. Not sleeping well because of pain. Needing some additional labs checked, has order from rheumatology.  Working from home about 3 days per week.   Still doing PT. Seeing Dr. Glennon Mac about her low back pain and has MRI L spine scheduled.  Sleep study scheduled in January.  Still seeing pulmonology also.   States she has been having muscle tetany symptoms - R great toe; R arm; has had to grab her calf previously for issues; a few months ago said a coworker told her that her face had tensed up on one side  Left sided headaches / sometimes throbbing. Last week said she had a moment where she "felt woozy" and had to stand against the wall.   Past Medical History:  Diagnosis Date   Anemia    history   Anxiety    Aortic atherosclerosis (HCC)    Arthritis    mild RA, secondary to Sjogren's    CAD (coronary artery disease)    Complication of anesthesia    Depression    history of, treated with med. & therapy   Fibromyalgia    GERD (gastroesophageal reflux disease)    Heart murmur 2010   told by Dr. Birdie Riddle - seen perhaps on ECHO?   Hiatal hernia    History of kidney stones    passed spontaneously   Hyperlipidemia    Hypertension    PONV (postoperative nausea and vomiting)    mild nausea post op   Rheumatoid arthritis(714.0)    Sjogren's syndrome (HCC)     Past Surgical History:  Procedure Laterality Date   ANTERIOR CERVICAL DECOMP/DISCECTOMY FUSION N/A 11/02/2017   Procedure: ANTERIOR CERVICAL DECOMPRESSION FUSION, CERVICAL 6-7 WITH INSTRUMENTATION AND ALLOGRAFT;  Surgeon:  Phylliss Bob, MD;  Location: [facility];  Service: Orthopedics;  Laterality: N/A;   LEFT HEART CATH AND CORONARY ANGIOGRAPHY N/A 08/12/2021   Procedure: LEFT HEART CATH AND CORONARY ANGIOGRAPHY;  Surgeon: Belva Crome, MD;  Location: [facility];  Service: Cardiovascular;  Laterality: N/A;   OOPHORECTOMY     1 ovary remain, removed adhesions on bowel   REDUCTION MAMMAPLASTY Bilateral 11/2009   TONSILLECTOMY     born without tonsils    VESICOVAGINAL FISTULA CLOSURE W/ TAH      Family History  Problem Relation Age of Onset   Coronary artery disease Mother    Hypertension Mother    Diabetes Mother    Heart attack Father    Hypertension Father    CVA Father    Stroke Maternal Grandmother    Heart attack Paternal Grandmother    Breast cancer Maternal Aunt     Social History   Tobacco Use   Smoking status: Never    Passive exposure: Never   Smokeless tobacco: Never  Substance Use Topics   Alcohol use: No   Drug use: Never     Allergies  Allergen Reactions   Amitriptyline Swelling    Leg swelling    Latex Other (See Comments)    Burns skin   Amlodipine  Muscle cramps, felt dehydrated (at 2.'5mg'$  dose)   Lipitor [Atorvastatin] Itching    Review of Systems NEGATIVE UNLESS OTHERWISE INDICATED IN HPI      Objective:     BP 130/82 (BP Location: Left Arm)   Pulse 75   Temp 97.8 F (36.6 C) (Temporal)   Ht '5\' 4"'$  (1.626 m)   Wt 190 lb (86.2 kg)   SpO2 95%   BMI 32.61 kg/m   Wt Readings from Last 3 Encounters:  12/23/21 190 lb (86.2 kg)  12/15/21 189 lb 6.4 oz (85.9 kg)  12/08/21 194 lb (88 kg)    BP Readings from Last 3 Encounters:  12/23/21 130/82  12/15/21 124/68  12/08/21 138/88     Physical Exam Vitals and nursing note reviewed.  Constitutional:      Appearance: Normal appearance. She is normal weight. She is not toxic-appearing.  HENT:     Head: Normocephalic and atraumatic.  Eyes:     Extraocular Movements: Extraocular movements intact.      Conjunctiva/sclera: Conjunctivae normal.     Pupils: Pupils are equal, round, and reactive to light.  Cardiovascular:     Rate and Rhythm: Normal rate and regular rhythm.     Pulses: Normal pulses.     Heart sounds: Normal heart sounds.  Pulmonary:     Effort: Pulmonary effort is normal.     Breath sounds: Normal breath sounds.  Musculoskeletal:        General: Normal range of motion.     Cervical back: Normal range of motion and neck supple.  Skin:    General: Skin is warm and dry.  Neurological:     General: No focal deficit present.     Mental Status: She is alert and oriented to person, place, and time.     Cranial Nerves: Cranial nerves 2-12 are intact. No cranial nerve deficit or facial asymmetry.     Sensory: Sensation is intact.     Motor: No tremor or pronator drift.     Comments: Able to push / pull against me in all extremities, noticeably weaker R hand / arm vs L side.  N/V is intact.   Psychiatric:        Mood and Affect: Mood normal.        Behavior: Behavior normal.        Assessment & Plan:  Left-sided headache -     MR BRAIN WO CONTRAST; Future -     CBC with Differential/Platelet -     Comprehensive metabolic panel -     PTH, Intact (ICMA) and Ionized Calcium -     Magnesium  Dizziness -     MR BRAIN WO CONTRAST; Future -     CBC with Differential/Platelet -     Comprehensive metabolic panel -     PTH, Intact (ICMA) and Ionized Calcium -     Magnesium  [place] -     CBC with Differential/Platelet -     Comprehensive metabolic panel -     PTH, Intact (ICMA) and Ionized Calcium -     Magnesium  Leg cramping -     CBC with Differential/Platelet -     Comprehensive metabolic panel -     PTH, Intact (ICMA) and Ionized Calcium -     Magnesium  Elevated blood protein -     Protein electrophoresis, serum -     Protein Electrophoresis, Urine Rflx. -     Free K+L Lt Chains,Qn,S  Rheumatoid  arthritis involving both hips with positive rheumatoid  factor (Scalp Level) -     Ambulatory referral to Pain Clinic  Fibromyalgia -     Ambulatory referral to Pain Clinic   Complete labs today, treat pending any abnormal results. Continue regular f/up with rheum and sports medicine. Referral to pain clinic for worsening pain symptoms. MRI brain for new headache symptoms in >64 yo female; will go to ED for any sudden / severe / worse change in symptoms.     Return in about 3 months (around 03/24/2022) for recheck .  This note was prepared with assistance of Systems analyst. Occasional wrong-word or sound-a-like substitutions may have occurred due to the inherent limitations of voice recognition software.  Time Spent: 34 minutes of total time was spent on the date of the encounter performing the following actions: chart review prior to seeing the patient, obtaining history, performing a medically necessary exam, counseling on the treatment plan, placing orders, and documenting in our EHR.       Sisto Granillo M Emine Lopata, PA-C

## 2021-12-24 ENCOUNTER — Telehealth: Payer: Self-pay | Admitting: Sports Medicine

## 2021-12-24 NOTE — Telephone Encounter (Signed)
Pt has MRI scheduled for 12/16, would like something called in for her anxiety/claustrophia.

## 2021-12-27 ENCOUNTER — Other Ambulatory Visit: Payer: Self-pay | Admitting: Sports Medicine

## 2021-12-27 DIAGNOSIS — F40240 Claustrophobia: Secondary | ICD-10-CM

## 2021-12-27 MED ORDER — LORAZEPAM 0.5 MG PO TABS: ORAL_TABLET | ORAL | 0 refills | Status: DC

## 2021-12-27 NOTE — Progress Notes (Signed)
Rx sent to pharmacy for claustrophobia/anxiety during MRI scan.

## 2021-12-28 NOTE — Telephone Encounter (Signed)
Pt informed RX called in and to schedule MRI f/u via VM.

## 2021-12-29 ENCOUNTER — Other Ambulatory Visit: Payer: Self-pay | Admitting: Physician Assistant

## 2021-12-29 LAB — PTH, INTACT (ICMA) AND IONIZED CALCIUM
Calcium, Ion: 5.2 mg/dL (ref 4.7–5.5)
Calcium: 9.5 mg/dL (ref 8.6–10.4)
PTH: 55 pg/mL (ref 16–77)

## 2021-12-29 LAB — PROTEIN ELECTROPHORESIS, SERUM
Albumin ELP: 3.6 g/dL — ABNORMAL LOW (ref 3.8–4.8)
Alpha 1: 0.3 g/dL (ref 0.2–0.3)
Alpha 2: 0.9 g/dL (ref 0.5–0.9)
Beta 2: 0.5 g/dL (ref 0.2–0.5)
Beta Globulin: 0.5 g/dL (ref 0.4–0.6)
Gamma Globulin: 2.7 g/dL — ABNORMAL HIGH (ref 0.8–1.7)
Total Protein: 8.6 g/dL — ABNORMAL HIGH (ref 6.1–8.1)

## 2021-12-29 LAB — PROTEIN ELECTROPHORESIS, URINE REFLEX
Albumin ELP, Urine: 100 %
Alpha-1-Globulin, U: 0 %
Alpha-2-Globulin, U: 0 %
Beta Globulin, U: 0 %
Gamma Globulin, U: 0 %
Protein, Ur: 13.6 mg/dL

## 2021-12-29 LAB — FREE K+L LT CHAINS,QN,S
Ig Kappa Free Light Chain: 62.6 mg/L — ABNORMAL HIGH (ref 3.3–19.4)
Ig Lambda Free Light Chain: 27.9 mg/L — ABNORMAL HIGH (ref 5.7–26.3)
KAPPA/LAMBDA RATIO: 2.24 — ABNORMAL HIGH (ref 0.26–1.65)

## 2021-12-31 ENCOUNTER — Other Ambulatory Visit: Payer: Self-pay | Admitting: Physician Assistant

## 2021-12-31 DIAGNOSIS — R42 Dizziness and giddiness: Secondary | ICD-10-CM

## 2021-12-31 DIAGNOSIS — R519 Headache, unspecified: Secondary | ICD-10-CM

## 2022-01-01 ENCOUNTER — Ambulatory Visit
Admission: RE | Admit: 2022-01-01 | Discharge: 2022-01-01 | Disposition: A | Discharge status: Home / Self Care | Payer: 59 | Source: Ambulatory Visit | Attending: Sports Medicine | Admitting: Sports Medicine

## 2022-01-01 DIAGNOSIS — M05751 Rheumatoid arthritis with rheumatoid factor of right hip without organ or systems involvement: Secondary | ICD-10-CM

## 2022-01-01 DIAGNOSIS — M3500 Sicca syndrome, unspecified: Secondary | ICD-10-CM

## 2022-01-01 DIAGNOSIS — M545 Low back pain, unspecified: Secondary | ICD-10-CM

## 2022-01-01 DIAGNOSIS — F40240 Claustrophobia: Secondary | ICD-10-CM

## 2022-01-04 NOTE — Progress Notes (Unsigned)
    Benito Mccreedy D.Hana Burgin Phone: [phone]   Assessment and Plan:     There are no diagnoses linked to this encounter.  ***   Pertinent previous records reviewed include ***   Follow Up: ***     Subjective:   I, [name], am serving as a Education administrator for Doctor Glennon Mac   Chief Complaint: bilateral hip and low back pain    HPI:    10/05/2021 Patient is a 66 year old female complaining of bilateral hip and low back pain. Patient states her groin area has been bothering her for a long time , 2018 fell on black ice , will ave numbness In her feet , and sometimes her feet become atrophied like she can walk , wants to get more mobile wants to be able to dance again , has a hard time turning in bed pain in her groin has pressure on her bladder    12/08/2021 Patient states Pt helps some still has low back / tailbone pain, left knee has been bothering her she received a CSI years ago and that helped a lot, water aerobic muscles were tight after, meloxicam didn't work for her , some days are good some days are bad    01/05/2022 Patient states    Relevant Historical Information: Rheumatoid arthritis, Sjogren's  Additional pertinent review of systems negative.   Current Outpatient Medications:    aspirin EC 81 MG tablet, Take 1 tablet (81 mg total) by mouth daily. Swallow whole., Disp: 90 tablet, Rfl: 3   diclofenac (VOLTAREN) 50 MG EC tablet, 1 tablet as needed Orally Twice a day for 14 days, Disp: , Rfl:    lisinopril-hydrochlorothiazide (ZESTORETIC) 20-25 MG tablet, Take 1 tablet by mouth daily., Disp: 90 tablet, Rfl: 3   LORazepam (ATIVAN) 0.5 MG tablet, 1-2 tabs 30 - 60 min prior to MRI. Do not drive with this medicine., Disp: 4 tablet, Rfl: 0  Current Facility-Administered Medications:    sodium chloride flush (NS) 0.9 % injection 3 mL, 3 mL, Intravenous, Q12H, Dunn, Dayna N, PA-C    Objective:     There were no vitals filed for this visit.    There is no height or weight on file to calculate BMI.    Physical Exam:    ***   Electronically signed by:  Benito Mccreedy D.[facility] 7:37 AM 01/04/22

## 2022-01-05 ENCOUNTER — Ambulatory Visit (INDEPENDENT_AMBULATORY_CARE_PROVIDER_SITE_OTHER): Payer: 59 | Admitting: Sports Medicine

## 2022-01-05 VITALS — BP 100/80 | HR 80 | Ht 64.0 in | Wt 188.0 lb

## 2022-01-05 DIAGNOSIS — R779 Abnormality of plasma protein, unspecified: Secondary | ICD-10-CM

## 2022-01-05 DIAGNOSIS — G8929 Other chronic pain: Secondary | ICD-10-CM

## 2022-01-05 DIAGNOSIS — R898 Other abnormal findings in specimens from other organs, systems and tissues: Secondary | ICD-10-CM | POA: Diagnosis not present

## 2022-01-05 DIAGNOSIS — M545 Low back pain, unspecified: Secondary | ICD-10-CM

## 2022-01-05 DIAGNOSIS — M3500 Sicca syndrome, unspecified: Secondary | ICD-10-CM | POA: Diagnosis not present

## 2022-01-05 DIAGNOSIS — M05751 Rheumatoid arthritis with rheumatoid factor of right hip without organ or systems involvement: Secondary | ICD-10-CM | POA: Diagnosis not present

## 2022-01-05 DIAGNOSIS — M05752 Rheumatoid arthritis with rheumatoid factor of left hip without organ or systems involvement: Secondary | ICD-10-CM

## 2022-01-05 DIAGNOSIS — D649 Anemia, unspecified: Secondary | ICD-10-CM

## 2022-01-05 NOTE — Patient Instructions (Addendum)
Good to see you  Referral to [facility]  As needed follow up

## 2022-01-11 ENCOUNTER — Ambulatory Visit: Payer: 59 | Admitting: Primary Care

## 2022-01-11 ENCOUNTER — Ambulatory Visit: Payer: 59 | Admitting: Adult Health

## 2022-01-18 ENCOUNTER — Telehealth: Payer: Self-pay | Admitting: Sports Medicine

## 2022-01-18 ENCOUNTER — Other Ambulatory Visit: Payer: Self-pay | Admitting: Sports Medicine

## 2022-01-18 DIAGNOSIS — G8929 Other chronic pain: Secondary | ICD-10-CM

## 2022-01-18 DIAGNOSIS — R898 Other abnormal findings in specimens from other organs, systems and tissues: Secondary | ICD-10-CM

## 2022-01-18 DIAGNOSIS — M3500 Sicca syndrome, unspecified: Secondary | ICD-10-CM

## 2022-01-18 DIAGNOSIS — R779 Abnormality of plasma protein, unspecified: Secondary | ICD-10-CM

## 2022-01-18 NOTE — Telephone Encounter (Signed)
Patient called in regards to the referral sent to [facility] for Oncology. She said that it will be a while before she can be seen there so she would like to referral changed to [facility] Oncology and Hematology? Can this be changed for her please?

## 2022-01-18 NOTE — Telephone Encounter (Signed)
New referral to oncology and hematology was placed

## 2022-01-26 ENCOUNTER — Encounter: Payer: Self-pay | Admitting: Hematology & Oncology

## 2022-01-26 ENCOUNTER — Inpatient Hospital Stay (HOSPITAL_BASED_OUTPATIENT_CLINIC_OR_DEPARTMENT_OTHER): Payer: 59 | Admitting: Hematology & Oncology

## 2022-01-26 ENCOUNTER — Telehealth: Payer: Self-pay | Admitting: Primary Care

## 2022-01-26 ENCOUNTER — Inpatient Hospital Stay: Payer: 59 | Attending: Hematology & Oncology

## 2022-01-26 VITALS — BP 146/72 | HR 85 | Temp 98.0°F | Resp 17 | Ht 64.0 in | Wt 187.8 lb

## 2022-01-26 DIAGNOSIS — M05711 Rheumatoid arthritis with rheumatoid factor of right shoulder without organ or systems involvement: Secondary | ICD-10-CM | POA: Diagnosis not present

## 2022-01-26 DIAGNOSIS — D509 Iron deficiency anemia, unspecified: Secondary | ICD-10-CM

## 2022-01-26 DIAGNOSIS — R937 Abnormal findings on diagnostic imaging of other parts of musculoskeletal system: Secondary | ICD-10-CM | POA: Diagnosis not present

## 2022-01-26 DIAGNOSIS — D472 Monoclonal gammopathy: Secondary | ICD-10-CM

## 2022-01-26 LAB — LACTATE DEHYDROGENASE: LDH: 133 U/L (ref 98–192)

## 2022-01-26 LAB — CMP (CANCER CENTER ONLY)
ALT: 23 U/L (ref 0–44)
AST: 30 U/L (ref 15–41)
Albumin: 3.9 g/dL (ref 3.5–5.0)
Alkaline Phosphatase: 76 U/L (ref 38–126)
Anion gap: 8 (ref 5–15)
BUN: 14 mg/dL (ref 8–23)
CO2: 26 mmol/L (ref 22–32)
Calcium: 10.1 mg/dL (ref 8.9–10.3)
Chloride: 102 mmol/L (ref 98–111)
Creatinine: 0.77 mg/dL (ref 0.44–1.00)
GFR, Estimated: >60 mL/min (ref >60)
Glucose, Bld: 65 mg/dL — ABNORMAL LOW (ref 70–99)
Potassium: 3.6 mmol/L (ref 3.5–5.1)
Sodium: 136 mmol/L (ref 135–145)
Total Bilirubin: 0.3 mg/dL (ref 0.3–1.2)
Total Protein: 9.3 g/dL — ABNORMAL HIGH (ref 6.5–8.1)

## 2022-01-26 LAB — CBC WITH DIFFERENTIAL (CANCER CENTER ONLY)
Abs Immature Granulocytes: 0.02 10*3/uL (ref 0.00–0.07)
Basophils Absolute: 0 10*3/uL (ref 0.0–0.1)
Basophils Relative: 1 %
Eosinophils Absolute: 0.1 10*3/uL (ref 0.0–0.5)
Eosinophils Relative: 1 %
HCT: 35 % — ABNORMAL LOW (ref 36.0–46.0)
Hemoglobin: 10.9 g/dL — ABNORMAL LOW (ref 12.0–15.0)
Immature Granulocytes: 0 %
Lymphocytes Relative: 19 %
Lymphs Abs: 1.2 10*3/uL (ref 0.7–4.0)
MCH: 25.6 pg — ABNORMAL LOW (ref 26.0–34.0)
MCHC: 31.1 g/dL (ref 30.0–36.0)
MCV: 82.2 fL (ref 80.0–100.0)
Monocytes Absolute: 0.9 10*3/uL (ref 0.1–1.0)
Monocytes Relative: 14 %
Neutro Abs: 3.9 10*3/uL (ref 1.7–7.7)
Neutrophils Relative %: 65 %
Platelet Count: 210 10*3/uL (ref 150–400)
RBC: 4.26 MIL/uL (ref 3.87–5.11)
RDW: 14.1 % (ref 11.5–15.5)
WBC Count: 6 10*3/uL (ref 4.0–10.5)
nRBC: 0 % (ref 0.0–0.2)

## 2022-01-26 LAB — RETICULOCYTES
Immature Retic Fract: 9.3 % (ref 2.3–15.9)
RBC.: 4.13 MIL/uL (ref 3.87–5.11)
Retic Count, Absolute: 48.3 10*3/uL (ref 19.0–186.0)
Retic Ct Pct: 1.2 % (ref 0.4–3.1)

## 2022-01-26 LAB — SAVE SMEAR(SSMR), FOR PROVIDER SLIDE REVIEW

## 2022-01-26 LAB — FERRITIN: Ferritin: 21 ng/mL (ref 11–307)

## 2022-01-26 NOTE — Progress Notes (Signed)
Referral MD  Reason for Referral: Anemia; abnormal marrow on MRI  Chief Complaint  Patient presents with   New Patient (Initial Visit)  : I may have a blood problem.  HPI: [name] is a very charming 67 year old African-American female.  She is originally from [place].  We had a good time talking about [place].  She actually has an incredibly interesting job.  She works for [employer].  She is in a very special unit of the [employer].  She has been having a lot of fatigue.  She been having some discomfort in her back.  Patient has a history of rheumatoid arthritis and Sjogren's.  She has had this for several years.  She has been on Plaquenil but cannot tolerate any Plaquenil.  I do not think she is on anything for this right now side of nonsteroidals.  She sees Dr. Glennon Mac.  She had a MRI of the lumbar spine back on 01/01/2022.  This showed diffuse marrow abnormality.  She had patchy areas that were hypointense.  She did have some mild degenerative disc disease.  Of note, there is made mention that the patchy marrow was seen on his cervical spine MRI back in 2018.  She then had some lab work done.  She does have some anemia.  Her hemoglobin was 10.7 with hematocrit of 33.1.  Her MCV is quite low at 81.5.  She is iron deficient with iron saturation of 13%.  She had an SPEP that was done.  This did not show any monoclonal protein.  However, she was referred to the [facility] because of the abnormal bone marrow on MRI.  She has had no obvious bleeding.  She is up-to-date with her screening studies.  She does not smoke.  She really does not have adult beverages.  There is no history of sickle cell in the family.  She does not have any evidence of sarcoidosis.  There is been no issues with COVID or Influenza.  She is not a vegetarian.  She has had no rashes.  There is been no leg swelling.  Overall, I would have to say that her performance status is ECOG  1.   Past Medical History:  Diagnosis Date   Anemia    history   Anxiety    Aortic atherosclerosis (HCC)    Arthritis    mild RA, secondary to Sjogren's    CAD (coronary artery disease)    Complication of anesthesia    Depression    history of, treated with med. & therapy   Fibromyalgia    GERD (gastroesophageal reflux disease)    Heart murmur 2010   told by Dr. Birdie Riddle - seen perhaps on ECHO?   Hiatal hernia    History of kidney stones    passed spontaneously   Hyperlipidemia    Hypertension    PONV (postoperative nausea and vomiting)    mild nausea post op   Rheumatoid arthritis(714.0)    Sjogren's syndrome ([place])   :   Past Surgical History:  Procedure Laterality Date   ANTERIOR CERVICAL DECOMP/DISCECTOMY FUSION N/A 11/02/2017   Procedure: ANTERIOR CERVICAL DECOMPRESSION FUSION, CERVICAL 6-7 WITH INSTRUMENTATION AND ALLOGRAFT;  Surgeon: Phylliss Bob, MD;  Location: [place];  Service: Orthopedics;  Laterality: N/A;   LEFT HEART CATH AND CORONARY ANGIOGRAPHY N/A 08/12/2021   Procedure: LEFT HEART CATH AND CORONARY ANGIOGRAPHY;  Surgeon: Belva Crome, MD;  Location: [facility] [facility];  Service: Cardiovascular;  Laterality:  N/A;   OOPHORECTOMY     1 ovary remain, removed adhesions on bowel   REDUCTION MAMMAPLASTY Bilateral 11/2009   TONSILLECTOMY     born without tonsils    VESICOVAGINAL FISTULA CLOSURE W/ TAH    :   Current Outpatient Medications:    aspirin EC 81 MG tablet, Take 1 tablet (81 mg total) by mouth daily. Swallow whole., Disp: 90 tablet, Rfl: 3   lisinopril-hydrochlorothiazide (ZESTORETIC) 20-25 MG tablet, Take 1 tablet by mouth daily., Disp: 90 tablet, Rfl: 3   diclofenac (VOLTAREN) 50 MG EC tablet, 1 tablet as needed Orally Twice a day for 14 days (Patient not taking: Reported on 01/26/2022), Disp: , Rfl:    LORazepam (ATIVAN) 0.5 MG tablet, 1-2 tabs 30 - 60 min prior to MRI. Do not drive with this medicine. (Patient not taking: Reported on 01/26/2022),  Disp: 4 tablet, Rfl: 0  Current Facility-Administered Medications:    sodium chloride flush (NS) 0.9 % injection 3 mL, 3 mL, Intravenous, Q12H, Dunn, Dayna N, PA-C:   sodium chloride flush  3 mL Intravenous Q12H  :   Allergies  Allergen Reactions   Amitriptyline Swelling    Leg swelling    Latex Other (See Comments)    Burns skin   Amlodipine     Muscle cramps, felt dehydrated (at 2.'5mg'$  dose)   Lipitor [Atorvastatin] Itching  :   Family History  Problem Relation Age of Onset   Coronary artery disease Mother    Hypertension Mother    Diabetes Mother    Heart attack Father    Hypertension Father    CVA Father    Stroke Maternal Grandmother    Heart attack Paternal Grandmother    Breast cancer Maternal Aunt   :   Social History   Socioeconomic History   Marital status: Married    Spouse name: Not on file   Number of children: 1   Years of education: Not on file   Highest education level: Not on file  Occupational History    Comment: [employer]    Employer: [employer]  Tobacco Use   Smoking status: Never    Passive exposure: Never   Smokeless tobacco: Never  Substance and Sexual Activity   Alcohol use: No   Drug use: Never   Sexual activity: Not on file  Other Topics Concern   Not on file  Social History Narrative   Not on file   Social Determinants of Health   Financial Resource Strain: Not on file  Food Insecurity: Not on file  Transportation Needs: Not on file  Physical Activity: Not on file  Stress: Not on file  Social Connections: Not on file  Intimate Partner Violence: Not on file  :  Review of Systems  Constitutional:  Positive for malaise/fatigue.  HENT: Negative.    Eyes: Negative.   Respiratory: Negative.    Cardiovascular: Negative.   Gastrointestinal: Negative.   Genitourinary: Negative.   Musculoskeletal:  Positive for back pain.  Skin: Negative.   Neurological: Negative.   Endo/Heme/Allergies: Negative.    Psychiatric/Behavioral: Negative.       Exam: Vital signs are temperature of 98.  Pulse 85.  Blood pressure 146/72.  Weight is 187 pounds.  '@IPVITALS'$ @ Physical Exam Vitals reviewed.  HENT:     Head: Normocephalic and atraumatic.  Eyes:     Pupils: Pupils are equal, round, and reactive to light.  Cardiovascular:     Rate and Rhythm: Normal rate and  regular rhythm.     Heart sounds: Normal heart sounds.  Pulmonary:     Effort: Pulmonary effort is normal.     Breath sounds: Normal breath sounds.  Abdominal:     General: Bowel sounds are normal.     Palpations: Abdomen is soft.  Musculoskeletal:        General: No tenderness or deformity. Normal range of motion.     Cervical back: Normal range of motion.  Lymphadenopathy:     Cervical: No cervical adenopathy.  Skin:    General: Skin is warm and dry.     Findings: No erythema or rash.  Neurological:     Mental Status: She is alert and oriented to person, place, and time.  Psychiatric:        Behavior: Behavior normal.        Thought Content: Thought content normal.        Judgment: Judgment normal.     Recent Labs    01/26/22 1102  WBC 6.0  HGB 10.9*  HCT 35.0*  PLT 210    Recent Labs    01/26/22 1102  NA 136  K 3.6  CL 102  CO2 26  GLUCOSE 65*  BUN 14  CREATININE 0.77  CALCIUM 10.1    Blood smear review: Mild anisocytosis and poikilocytosis.  She has no nucleated red blood cells.  I see no teardrop cells.  There is no schistocytes.  I see no target cells.  She has no rouleaux formation.  White blood cells appear normal in morphology maturation.  There are no hypersegmented polys.  She has no immature myeloid or lymphoid cells.  Platelets are adequate in number and size.  Pathology: None     Assessment and Plan: [name] is a very charming 67 year old African-American female.  She has an abnormal bone marrow on her MRI.  I think is interesting to note that this bone marrow pattern was also noted 6  years ago on a cervical spine MRI.  It is hard to say if there is a real hematologic issue here.  It is hard to say if she has myeloma.  I would think that she did, the SPEP would have shown [name] that.  However, she might have a light chain myeloma or non-secretory myeloma.  I think that a 24-hour urine would be very helpful.  I will go ahead and get this ordered.  She will bring this in in a week.  I do think a PET scan would not be a bad idea.  If there is myeloma, a PET scan should be able to show [name] bony activity.  Ultimately, we may have to think about a bone marrow biopsy.  This is invasive.  I really would like to believe that we would not have to do 1.  This would be the last test I would think of doing for her.  Again, given the fact that she has likely iron deficiency, this could be the reason for the abnormality on the MRI.  Given the fact that she has a autoimmune disorders, this could also cause abnormalities on the MRI.  However, given the fact that she does have the autoimmune abnormalities, this could increase her risk of a plasma cell disorder.  We will see what her labs show.  I will see what the PET scan shows.  We will then plan for follow-up depending on what we see with our studies.  She has a very strong faith.  I gave  her a prayer blanket.  She was very thankful for this.  It did make her feel better.

## 2022-01-26 NOTE — Telephone Encounter (Signed)
Patient called to cancel her upcoming HST appt. On 1/15.  She stated she did not want to reschedule and just wanted to cancel appt.  CB# [phone]

## 2022-01-26 NOTE — Telephone Encounter (Signed)
Cancelled pt's hst appt - she does not want to reschedule.  Nothing further needed.

## 2022-01-27 LAB — KAPPA/LAMBDA LIGHT CHAINS
Kappa free light chain: 61.2 mg/L — ABNORMAL HIGH (ref 3.3–19.4)
Kappa, lambda light chain ratio: 2.24 — ABNORMAL HIGH (ref 0.26–1.65)
Lambda free light chains: 27.3 mg/L — ABNORMAL HIGH (ref 5.7–26.3)

## 2022-01-27 LAB — IRON AND IRON BINDING CAPACITY (CC-WL,HP ONLY)
Iron: 67 ug/dL (ref 28–170)
Saturation Ratios: 15 % (ref 10.4–31.8)
TIBC: 445 ug/dL (ref 250–450)
UIBC: 378 ug/dL (ref 148–442)

## 2022-01-28 LAB — PROTEIN ELECTROPHORESIS, SERUM, WITH REFLEX
A/G Ratio: 0.7 (ref 0.7–1.7)
Albumin ELP: 3.4 g/dL (ref 2.9–4.4)
Alpha-1-Globulin: 0.2 g/dL (ref 0.0–0.4)
Alpha-2-Globulin: 0.8 g/dL (ref 0.4–1.0)
Beta Globulin: 1.2 g/dL (ref 0.7–1.3)
Gamma Globulin: 2.8 g/dL — ABNORMAL HIGH (ref 0.4–1.8)
Globulin, Total: 5 g/dL — ABNORMAL HIGH (ref 2.2–3.9)
Total Protein ELP: 8.4 g/dL (ref 6.0–8.5)

## 2022-01-28 LAB — HGB FRACTIONATION CASCADE
Hgb A2: 2.5 % (ref 1.8–3.2)
Hgb A: 97.5 % (ref 96.4–98.8)
Hgb F: 0 % (ref 0.0–2.0)
Hgb S: 0 %

## 2022-01-28 LAB — IGG, IGA, IGM
IgA: 362 mg/dL — ABNORMAL HIGH (ref 87–352)
IgG (Immunoglobin G), Serum: 3213 mg/dL — ABNORMAL HIGH (ref 586–1602)
IgM (Immunoglobulin M), Srm: 60 mg/dL (ref 26–217)

## 2022-01-30 ENCOUNTER — Other Ambulatory Visit: Payer: 59

## 2022-02-01 ENCOUNTER — Ambulatory Visit: Payer: 59 | Admitting: Physician Assistant

## 2022-02-01 ENCOUNTER — Inpatient Hospital Stay: Payer: 59

## 2022-02-01 ENCOUNTER — Encounter: Payer: Self-pay | Admitting: Physician Assistant

## 2022-02-01 DIAGNOSIS — F40240 Claustrophobia: Secondary | ICD-10-CM

## 2022-02-01 DIAGNOSIS — R937 Abnormal findings on diagnostic imaging of other parts of musculoskeletal system: Secondary | ICD-10-CM | POA: Diagnosis not present

## 2022-02-01 DIAGNOSIS — M05711 Rheumatoid arthritis with rheumatoid factor of right shoulder without organ or systems involvement: Secondary | ICD-10-CM

## 2022-02-01 DIAGNOSIS — M05751 Rheumatoid arthritis with rheumatoid factor of right hip without organ or systems involvement: Secondary | ICD-10-CM | POA: Diagnosis not present

## 2022-02-01 DIAGNOSIS — M797 Fibromyalgia: Secondary | ICD-10-CM | POA: Diagnosis not present

## 2022-02-01 DIAGNOSIS — D472 Monoclonal gammopathy: Secondary | ICD-10-CM

## 2022-02-01 DIAGNOSIS — M25562 Pain in left knee: Secondary | ICD-10-CM

## 2022-02-01 DIAGNOSIS — R4589 Other symptoms and signs involving emotional state: Secondary | ICD-10-CM

## 2022-02-01 DIAGNOSIS — M05752 Rheumatoid arthritis with rheumatoid factor of left hip without organ or systems involvement: Secondary | ICD-10-CM

## 2022-02-01 DIAGNOSIS — F418 Other specified anxiety disorders: Secondary | ICD-10-CM

## 2022-02-01 MED ORDER — PREDNISONE 50 MG PO TABS: ORAL_TABLET | ORAL | 0 refills | Status: DC

## 2022-02-01 MED ORDER — LORAZEPAM 0.5 MG PO TABS: ORAL_TABLET | ORAL | 0 refills | Status: DC

## 2022-02-01 NOTE — Progress Notes (Signed)
Subjective:    Patient ID: [name], female    DOB: 06-17-1955, 67 y.o.   MRN: [id number]  Chief Complaint  Patient presents with   Follow-up    HPI Patient is in today for patient returns for follow-up discussion follow-up recent appointment with hematology/oncology and rheumatology.  She is also experiencing worse pain in her left knee over the last week.  No known injury.  Feels swollen and tight.  Both sides of the knee are hurting.  States that she is really been struggling with the amount of pain she is in.  She is also worried about her health overall and her upcoming PET scan.  Past Medical History:  Diagnosis Date   Anemia    history   Anxiety    Aortic atherosclerosis (HCC)    Arthritis    mild RA, secondary to Sjogren's    CAD (coronary artery disease)    Complication of anesthesia    Depression    history of, treated with med. & therapy   Fibromyalgia    GERD (gastroesophageal reflux disease)    Heart murmur 2010   told by Dr. Birdie Riddle - seen perhaps on ECHO?   Hiatal hernia    History of kidney stones    passed spontaneously   Hyperlipidemia    Hypertension    PONV (postoperative nausea and vomiting)    mild nausea post op   Rheumatoid arthritis(714.0)    Sjogren's syndrome (HCC)     Past Surgical History:  Procedure Laterality Date   ANTERIOR CERVICAL DECOMP/DISCECTOMY FUSION N/A 11/02/2017   Procedure: ANTERIOR CERVICAL DECOMPRESSION FUSION, CERVICAL 6-7 WITH INSTRUMENTATION AND ALLOGRAFT;  Surgeon: Phylliss Bob, MD;  Location: [place];  Service: Orthopedics;  Laterality: N/A;   LEFT HEART CATH AND CORONARY ANGIOGRAPHY N/A 08/12/2021   Procedure: LEFT HEART CATH AND CORONARY ANGIOGRAPHY;  Surgeon: Belva Crome, MD;  Location: [facility];  Service: Cardiovascular;  Laterality: N/A;   OOPHORECTOMY     1 ovary remain, removed adhesions on bowel   REDUCTION MAMMAPLASTY Bilateral 11/2009   TONSILLECTOMY     born without tonsils     VESICOVAGINAL FISTULA CLOSURE W/ TAH      Family History  Problem Relation Age of Onset   Coronary artery disease Mother    Hypertension Mother    Diabetes Mother    Heart attack Father    Hypertension Father    CVA Father    Stroke Maternal Grandmother    Heart attack Paternal Grandmother    Breast cancer Maternal Aunt     Social History   Tobacco Use   Smoking status: Never    Passive exposure: Never   Smokeless tobacco: Never  Substance Use Topics   Alcohol use: No   Drug use: Never     Allergies  Allergen Reactions   Amitriptyline Swelling    Leg swelling    Latex Other (See Comments)    Burns skin   Amlodipine     Muscle cramps, felt dehydrated (at 2.'5mg'$  dose)   Lipitor [Atorvastatin] Itching    Review of Systems NEGATIVE UNLESS OTHERWISE INDICATED IN HPI      Objective:     There were no vitals taken for this visit.  Wt Readings from Last 3 Encounters:  01/26/22 187 lb 12.8 oz (85.2 kg)  01/05/22 188 lb (85.3 kg)  12/23/21 190 lb (86.2 kg)    BP Readings from Last 3 Encounters:  01/26/22 (!) 146/72  01/05/22  100/80  12/23/21 130/82     Physical Exam Vitals and nursing note reviewed.  Constitutional:      Appearance: Normal appearance.  Cardiovascular:     Rate and Rhythm: Regular rhythm.     Pulses: Normal pulses.  Pulmonary:     Effort: Pulmonary effort is normal.     Breath sounds: Normal breath sounds.  Musculoskeletal:     Left knee: Swelling and bony tenderness present. No effusion or erythema. Normal range of motion. Tenderness present over the medial joint line and lateral joint line.     Right lower leg: No edema.     Left lower leg: No edema.  Neurological:     Mental Status: She is alert.  Psychiatric:        Mood and Affect: Mood is anxious. Affect is tearful.        Assessment & Plan:  Acute pain of left knee  Claustrophobia -     LORazepam; Take one tab po qhs prn muscle spasm or severe anxiety. Do not drive  with this medicine.  Dispense: 10 tablet; Refill: 0  Rheumatoid arthritis involving both hips with positive rheumatoid factor (HCC)  Fibromyalgia  MGUS (monoclonal gammopathy of unknown significance)  Anxiety about health  Other orders -     predniSONE; Take one tablet po qd x 5 days with food.  Dispense: 5 tablet; Refill: 0  Always good to meet with Ms. Tuohy. We spent a good deal of time discussing her recent consults with specialists.  I reviewed her note from Dr. Marin Olp.  I agree with upcoming plan, encouraged her that more knowledge from the PET scan will be helpful and then she can make decisions from there.  Unfortunately, she never did receive a call from [facility], and I provided the phone number for her today.    She is in an acute flareup of fibro and RA pain, especially in her left knee.  Burst of prednisone to take as directed should help.  Cautioned patient about possible prednisone side effects.  She is also experiencing more anxiety recently because of her health status.  She tolerated lorazepam well when she had her MRI, I will provide 10 pills for her to take as needed for acute anxiety in the evenings.  She is aware about possible risk versus benefit and side effects from these medications.  She is also aware of possible abuse potential.  She is understanding of plan.  I look forward to meeting with her again.  I know she will keep me updated on how she is doing.    Return in about 3 months (around 05/03/2022) for recheck.  This note was prepared with assistance of Systems analyst. Occasional wrong-word or sound-a-like substitutions may have occurred due to the inherent limitations of voice recognition software.  Time Spent: 45 minutes of total time was spent on the date of the encounter performing the following actions: chart review prior to seeing the patient, obtaining history, performing a medically necessary exam, counseling on the  treatment plan, placing orders, and documenting in our EHR.       Angelika Jerrett M Makela Niehoff, PA-C

## 2022-02-01 NOTE — Patient Instructions (Signed)
Please call - [facility] Address: [address] [place] [address] Phone: [phone] Fax: [phone]

## 2022-02-02 DIAGNOSIS — M797 Fibromyalgia: Secondary | ICD-10-CM | POA: Insufficient documentation

## 2022-02-02 DIAGNOSIS — D472 Monoclonal gammopathy: Secondary | ICD-10-CM | POA: Insufficient documentation

## 2022-02-03 LAB — UPEP/UIFE/LIGHT CHAINS/TP, 24-HR UR
% BETA, Urine: 0 %
ALPHA 1 URINE: 0 %
Albumin, U: 100 %
Alpha 2, Urine: 0 %
Free Kappa Lt Chains,Ur: 11.39 mg/L (ref 1.17–86.46)
Free Kappa/Lambda Ratio: >16.51 — ABNORMAL HIGH (ref 1.83–14.26)
Free Lambda Lt Chains,Ur: <0.69 mg/L (ref 0.27–15.21)
GAMMA GLOBULIN URINE: 0 %
Total Protein, Urine-Ur/day: 60 mg/24 hr (ref 30–150)
Total Protein, Urine: 6.7 mg/dL
Total Volume: 900

## 2022-02-04 ENCOUNTER — Encounter: Payer: Self-pay | Admitting: *Deleted

## 2022-02-10 ENCOUNTER — Ambulatory Visit: Payer: 59 | Admitting: Physician Assistant

## 2022-02-10 ENCOUNTER — Ambulatory Visit: Payer: 59 | Admitting: Primary Care

## 2022-02-11 ENCOUNTER — Encounter (HOSPITAL_COMMUNITY)
Admission: RE | Admit: 2022-02-11 | Discharge: 2022-02-11 | Disposition: A | Discharge status: Home / Self Care | Payer: 59 | Source: Ambulatory Visit | Attending: Hematology & Oncology | Admitting: Hematology & Oncology

## 2022-02-11 DIAGNOSIS — D472 Monoclonal gammopathy: Secondary | ICD-10-CM | POA: Insufficient documentation

## 2022-02-11 LAB — GLUCOSE, CAPILLARY: Glucose-Capillary: 88 mg/dL (ref 70–99)

## 2022-02-11 MED ORDER — FLUDEOXYGLUCOSE F - 18 (FDG) INJECTION
8.9000 | Freq: Once | INTRAVENOUS | Status: AC
  Administered 2022-02-11: 9.29 via INTRAVENOUS

## 2022-02-15 ENCOUNTER — Telehealth: Payer: Self-pay | Admitting: *Deleted

## 2022-02-15 NOTE — Telephone Encounter (Signed)
-----  Message from Volanda Napoleon, MD sent at 02/14/2022  4:14 PM EST ----- Call and let her know that the PET scan is negative for any obvious myeloma.  There are some small lymph nodes under the arm and in the groin area that have a little bit of activity but this is not clinically significant right now.  I need to get her back to see me in 2-3 weeks.  Please set her up for an appointment.

## 2022-02-17 ENCOUNTER — Other Ambulatory Visit: Payer: Self-pay | Admitting: *Deleted

## 2022-02-17 DIAGNOSIS — M05711 Rheumatoid arthritis with rheumatoid factor of right shoulder without organ or systems involvement: Secondary | ICD-10-CM

## 2022-02-17 DIAGNOSIS — D472 Monoclonal gammopathy: Secondary | ICD-10-CM

## 2022-02-18 ENCOUNTER — Inpatient Hospital Stay: Payer: 59 | Attending: Hematology & Oncology

## 2022-02-18 ENCOUNTER — Encounter: Payer: Self-pay | Admitting: Hematology & Oncology

## 2022-02-18 ENCOUNTER — Inpatient Hospital Stay (HOSPITAL_BASED_OUTPATIENT_CLINIC_OR_DEPARTMENT_OTHER): Payer: 59 | Admitting: Hematology & Oncology

## 2022-02-18 VITALS — BP 146/75 | HR 87 | Temp 98.1°F | Resp 20 | Ht 64.0 in | Wt 187.8 lb

## 2022-02-18 DIAGNOSIS — M05711 Rheumatoid arthritis with rheumatoid factor of right shoulder without organ or systems involvement: Secondary | ICD-10-CM

## 2022-02-18 DIAGNOSIS — D509 Iron deficiency anemia, unspecified: Secondary | ICD-10-CM

## 2022-02-18 DIAGNOSIS — R768 Other specified abnormal immunological findings in serum: Secondary | ICD-10-CM | POA: Insufficient documentation

## 2022-02-18 DIAGNOSIS — D472 Monoclonal gammopathy: Secondary | ICD-10-CM

## 2022-02-18 DIAGNOSIS — D508 Other iron deficiency anemias: Secondary | ICD-10-CM | POA: Diagnosis not present

## 2022-02-18 HISTORY — DX: Iron deficiency anemia, unspecified: D50.9

## 2022-02-18 LAB — CBC WITH DIFFERENTIAL (CANCER CENTER ONLY)
Abs Immature Granulocytes: 0.05 10*3/uL (ref 0.00–0.07)
Basophils Absolute: 0 10*3/uL (ref 0.0–0.1)
Basophils Relative: 1 %
Eosinophils Absolute: 0.1 10*3/uL (ref 0.0–0.5)
Eosinophils Relative: 1 %
HCT: 36.6 % (ref 36.0–46.0)
Hemoglobin: 11.3 g/dL — ABNORMAL LOW (ref 12.0–15.0)
Immature Granulocytes: 1 %
Lymphocytes Relative: 18 %
Lymphs Abs: 1.2 10*3/uL (ref 0.7–4.0)
MCH: 25.8 pg — ABNORMAL LOW (ref 26.0–34.0)
MCHC: 30.9 g/dL (ref 30.0–36.0)
MCV: 83.6 fL (ref 80.0–100.0)
Monocytes Absolute: 0.9 10*3/uL (ref 0.1–1.0)
Monocytes Relative: 13 %
Neutro Abs: 4.7 10*3/uL (ref 1.7–7.7)
Neutrophils Relative %: 66 %
Platelet Count: 256 10*3/uL (ref 150–400)
RBC: 4.38 MIL/uL (ref 3.87–5.11)
RDW: 14.7 % (ref 11.5–15.5)
WBC Count: 7 10*3/uL (ref 4.0–10.5)
nRBC: 0 % (ref 0.0–0.2)

## 2022-02-18 LAB — CMP (CANCER CENTER ONLY)
ALT: 23 U/L (ref 0–44)
AST: 30 U/L (ref 15–41)
Albumin: 3.9 g/dL (ref 3.5–5.0)
Alkaline Phosphatase: 90 U/L (ref 38–126)
Anion gap: 7 (ref 5–15)
BUN: 11 mg/dL (ref 8–23)
CO2: 28 mmol/L (ref 22–32)
Calcium: 10.4 mg/dL — ABNORMAL HIGH (ref 8.9–10.3)
Chloride: 103 mmol/L (ref 98–111)
Creatinine: 0.87 mg/dL (ref 0.44–1.00)
GFR, Estimated: >60 mL/min (ref >60)
Glucose, Bld: 98 mg/dL (ref 70–99)
Potassium: 4.3 mmol/L (ref 3.5–5.1)
Sodium: 138 mmol/L (ref 135–145)
Total Bilirubin: 0.3 mg/dL (ref 0.3–1.2)
Total Protein: 9.4 g/dL — ABNORMAL HIGH (ref 6.5–8.1)

## 2022-02-18 NOTE — Progress Notes (Addendum)
Hematology and Oncology Follow Up Visit  [name] [id number] March 29, 1955 67 y.o. 02/18/2022   Principle Diagnosis:  Inflammatory protein elevation Iron deficiency anemia/anemia of chronic disease Rheumatoid arthritis/Sjogren's syndrome  Current Therapy:   Observation     Interim History:  [name] is back for follow-up.  This is her second office visit.  We first saw her, it was not clear to all that she had any obvious malignant hematologic problem.  However, we went ahead and did a full battery of test on her.  We did a SPEP.  There is no monoclonal spike in her blood.  She did have a elevated IgG level of 3200 mg/dL.  Her IgA level was also somewhat elevated at 362 mg/dL.  The light chains were both elevated.  The Kappa light chain is 6.1 mg/dL.  The lambda light chain was 2.7 mg/dL.  We did a 24-hour urine on her.  There was no monoclonal light chain in her urine.  We did do a PET scan on her.  The PET scan was done on 02/11/2022.  The PET scan not show any avid uptake suggestive of myeloma.  We did do iron studies on her.  Her ferritin was only 21 with an iron saturation of 15%.  As such, I think the only thing we found so far is that she has a somewhat low iron level.  She is tired of feeling poorly.  I do believe that a lot of this is from her rheumatologic problems.  However, I think a bone marrow biopsy will be the only way for [name] to definitively identify any hematologic malignancy.  She is agreeable to this.  She is still working.  She does have a lot of back issues.  I think the problem is that the MRI was read as having abnormal bone marrow.  Again this is totally unclear.  She has had no fever.  She has had no bleeding.  There has been no change in bowel or bladder habits.  Overall, I would say that her performance status is probably ECOG 1.  Medications:  Current Outpatient Medications:    aspirin EC 81 MG tablet, Take 1 tablet (81 mg total) by mouth  daily. Swallow whole., Disp: 90 tablet, Rfl: 3   lisinopril-hydrochlorothiazide (ZESTORETIC) 20-25 MG tablet, Take 1 tablet by mouth daily., Disp: 90 tablet, Rfl: 3   LORazepam (ATIVAN) 0.5 MG tablet, Take one tab po qhs prn muscle spasm or severe anxiety. Do not drive with this medicine., Disp: 10 tablet, Rfl: 0   diclofenac (VOLTAREN) 50 MG EC tablet, 1 tablet as needed Orally Twice a day for 14 days (Patient not taking: Reported on 01/26/2022), Disp: , Rfl:    predniSONE (DELTASONE) 50 MG tablet, Take one tablet po qd x 5 days with food., Disp: 5 tablet, Rfl: 0  Current Facility-Administered Medications:    sodium chloride flush (NS) 0.9 % injection 3 mL, 3 mL, Intravenous, Q12H, Dunn, Dayna N, PA-C  Allergies:  Allergies  Allergen Reactions   Amitriptyline Swelling    Leg swelling    Latex Other (See Comments)    Burns skin   Amlodipine     Muscle cramps, felt dehydrated (at 2.'5mg'$  dose)   Lipitor [Atorvastatin] Itching    Past Medical History, Surgical history, Social history, and Family History were reviewed and updated.  Review of Systems: Review of Systems  Constitutional: Negative.   HENT:  Negative.    Eyes: Negative.   Respiratory: Negative.  Cardiovascular: Negative.   Gastrointestinal: Negative.   Endocrine: Negative.   Genitourinary: Negative.    Musculoskeletal:  Positive for back pain.  Skin: Negative.   Neurological: Negative.   Hematological: Negative.   Psychiatric/Behavioral:  The patient is nervous/anxious.     Physical Exam:  height is '5\' 4"'$  (1.626 m) and weight is 187 lb 12.8 oz (85.2 kg). Her oral temperature is 98.1 F (36.7 C). Her blood pressure is 146/75 (abnormal) and her pulse is 87. Her respiration is 20 and oxygen saturation is 100%.   Wt Readings from Last 3 Encounters:  02/18/22 187 lb 12.8 oz (85.2 kg)  01/26/22 187 lb 12.8 oz (85.2 kg)  01/05/22 188 lb (85.3 kg)    Physical Exam Vitals reviewed.  HENT:     Head: Normocephalic  and atraumatic.  Eyes:     Pupils: Pupils are equal, round, and reactive to light.  Cardiovascular:     Rate and Rhythm: Normal rate and regular rhythm.     Heart sounds: Normal heart sounds.  Pulmonary:     Effort: Pulmonary effort is normal.     Breath sounds: Normal breath sounds.  Abdominal:     General: Bowel sounds are normal.     Palpations: Abdomen is soft.  Musculoskeletal:        General: No tenderness or deformity. Normal range of motion.     Cervical back: Normal range of motion.  Lymphadenopathy:     Cervical: No cervical adenopathy.  Skin:    General: Skin is warm and dry.     Findings: No erythema or rash.  Neurological:     Mental Status: She is alert and oriented to person, place, and time.  Psychiatric:        Behavior: Behavior normal.        Thought Content: Thought content normal.        Judgment: Judgment normal.      Lab Results  Component Value Date   WBC 7.0 02/18/2022   HGB 11.3 (L) 02/18/2022   HCT 36.6 02/18/2022   MCV 83.6 02/18/2022   PLT 256 02/18/2022     Chemistry      Component Value Date/Time   NA 138 02/18/2022 0922   NA 136 08/09/2021 1548   K 4.3 02/18/2022 0922   CL 103 02/18/2022 0922   CO2 28 02/18/2022 0922   BUN 11 02/18/2022 0922   BUN 14 08/09/2021 1548   CREATININE 0.87 02/18/2022 0922      Component Value Date/Time   CALCIUM 10.4 (H) 02/18/2022 0922   ALKPHOS 90 02/18/2022 0922   AST 30 02/18/2022 0922   ALT 23 02/18/2022 0922   BILITOT 0.3 02/18/2022 0922     Clinical blood smear, there is normochromic and normocytic population of red blood cells.  I see no nucleated red blood cells.  There are no teardrop cells.  There is no rouleaux formation.  Her white cells appear normal in morphology and maturation.  There is no immature myeloid or lymphoid forms.  There is no circulating plasma cells..  There are no hypersegmented polys.  Platelets are adequate in number and size.    Impression and Plan: [name]  is a very nice 67 year old African-American female.  She has rheumatologic issues.  She has back discomfort.  She has iron deficiency.  Again, I am not seeing an obvious hematologic malignancy.  Unfortunately, the only way for [name] to know is to do a bone marrow test on her.  I will have to set this up.  We will get this set up for a couple weeks.  This will tell [name] whether or not she definitively has a bone marrow problem.  I hate that she just feels rough.  Again I do not know if this might be her rheumatologic problems.  Again I want to make sure that we are aggressive with ruling out a hematologic malignancy.  Again I think that a bone marrow test is the only way for [name] to know this.  I will plan to get her back in about a month or so.  By then, I will have known the results of the bone marrow test.  We can then proceed with further recommendations.   Volanda Napoleon, MD 2/2/20242:36 PM

## 2022-02-24 ENCOUNTER — Ambulatory Visit: Payer: 59 | Admitting: Physician Assistant

## 2022-03-08 ENCOUNTER — Other Ambulatory Visit: Payer: Self-pay | Admitting: Radiology

## 2022-03-08 DIAGNOSIS — D472 Monoclonal gammopathy: Secondary | ICD-10-CM

## 2022-03-09 ENCOUNTER — Ambulatory Visit (HOSPITAL_COMMUNITY)
Admission: RE | Admit: 2022-03-09 | Discharge: 2022-03-09 | Disposition: A | Discharge status: Home / Self Care | Payer: 59 | Source: Ambulatory Visit | Attending: Hematology & Oncology | Admitting: Hematology & Oncology

## 2022-03-09 ENCOUNTER — Encounter (HOSPITAL_COMMUNITY): Payer: Self-pay

## 2022-03-09 ENCOUNTER — Other Ambulatory Visit: Payer: Self-pay

## 2022-03-09 DIAGNOSIS — K219 Gastro-esophageal reflux disease without esophagitis: Secondary | ICD-10-CM | POA: Insufficient documentation

## 2022-03-09 DIAGNOSIS — I10 Essential (primary) hypertension: Secondary | ICD-10-CM | POA: Insufficient documentation

## 2022-03-09 DIAGNOSIS — F32A Depression, unspecified: Secondary | ICD-10-CM | POA: Insufficient documentation

## 2022-03-09 DIAGNOSIS — D472 Monoclonal gammopathy: Secondary | ICD-10-CM | POA: Insufficient documentation

## 2022-03-09 DIAGNOSIS — E785 Hyperlipidemia, unspecified: Secondary | ICD-10-CM | POA: Insufficient documentation

## 2022-03-09 DIAGNOSIS — M797 Fibromyalgia: Secondary | ICD-10-CM | POA: Insufficient documentation

## 2022-03-09 DIAGNOSIS — I2510 Atherosclerotic heart disease of native coronary artery without angina pectoris: Secondary | ICD-10-CM | POA: Insufficient documentation

## 2022-03-09 DIAGNOSIS — M069 Rheumatoid arthritis, unspecified: Secondary | ICD-10-CM | POA: Insufficient documentation

## 2022-03-09 DIAGNOSIS — K449 Diaphragmatic hernia without obstruction or gangrene: Secondary | ICD-10-CM | POA: Insufficient documentation

## 2022-03-09 DIAGNOSIS — F419 Anxiety disorder, unspecified: Secondary | ICD-10-CM | POA: Diagnosis not present

## 2022-03-09 DIAGNOSIS — M3500 Sicca syndrome, unspecified: Secondary | ICD-10-CM | POA: Diagnosis not present

## 2022-03-09 HISTORY — PX: IR BONE MARROW BIOPSY & ASPIRATION: IMG5727

## 2022-03-09 LAB — CBC WITH DIFFERENTIAL/PLATELET
Abs Immature Granulocytes: 0.03 10*3/uL (ref 0.00–0.07)
Basophils Absolute: 0 10*3/uL (ref 0.0–0.1)
Basophils Relative: 0 %
Eosinophils Absolute: 0 10*3/uL (ref 0.0–0.5)
Eosinophils Relative: 1 %
HCT: 35 % — ABNORMAL LOW (ref 36.0–46.0)
Hemoglobin: 10.7 g/dL — ABNORMAL LOW (ref 12.0–15.0)
Immature Granulocytes: 1 %
Lymphocytes Relative: 17 %
Lymphs Abs: 1.2 10*3/uL (ref 0.7–4.0)
MCH: 25.7 pg — ABNORMAL LOW (ref 26.0–34.0)
MCHC: 30.6 g/dL (ref 30.0–36.0)
MCV: 83.9 fL (ref 80.0–100.0)
Monocytes Absolute: 0.9 10*3/uL (ref 0.1–1.0)
Monocytes Relative: 13 %
Neutro Abs: 4.5 10*3/uL (ref 1.7–7.7)
Neutrophils Relative %: 68 %
Platelets: 268 10*3/uL (ref 150–400)
RBC: 4.17 MIL/uL (ref 3.87–5.11)
RDW: 14.9 % (ref 11.5–15.5)
WBC: 6.7 10*3/uL (ref 4.0–10.5)
nRBC: 0 % (ref 0.0–0.2)

## 2022-03-09 MED ORDER — SODIUM CHLORIDE 0.9 % IV SOLN: INTRAVENOUS | Status: DC

## 2022-03-09 MED ORDER — LIDOCAINE HCL (PF) 1 % IJ SOLN
INTRAMUSCULAR | Status: AC
  Administered 2022-03-09: 10 mL via INTRADERMAL
  Filled 2022-03-09: qty 30

## 2022-03-09 MED ORDER — MIDAZOLAM HCL 2 MG/2ML IJ SOLN
INTRAMUSCULAR | Status: AC | PRN
  Administered 2022-03-09 (×2): 1 mg via INTRAVENOUS

## 2022-03-09 MED ORDER — HYDROCODONE-ACETAMINOPHEN 5-325 MG PO TABS: 1.0000 | ORAL_TABLET | ORAL | Status: DC | PRN

## 2022-03-09 MED ORDER — FENTANYL CITRATE (PF) 100 MCG/2ML IJ SOLN
INTRAMUSCULAR | Status: AC | PRN
  Administered 2022-03-09 (×2): 50 ug via INTRAVENOUS

## 2022-03-09 MED ORDER — FENTANYL CITRATE (PF) 100 MCG/2ML IJ SOLN
INTRAMUSCULAR | Status: AC
  Filled 2022-03-09: qty 2

## 2022-03-09 MED ORDER — MIDAZOLAM HCL 2 MG/2ML IJ SOLN
INTRAMUSCULAR | Status: AC
  Filled 2022-03-09: qty 2

## 2022-03-09 NOTE — Procedures (Signed)
  Procedure:  Bone marrow biopsy under fluoro L iliac Preprocedure diagnosis: The encounter diagnosis was MGUS (monoclonal gammopathy of unknown significance).    Postprocedure diagnosis: same EBL:    minimal Complications:   none immediate  See full dictation in [facility].  Dillard Cannon MD Main # [phone] Pager  [phone] Mobile [phone]

## 2022-03-09 NOTE — Discharge Instructions (Signed)
Discharge Instructions:   Please call Interventional Radiology clinic [phone] with any questions or concerns.  You may remove your dressing and shower tomorrow.   Moderate Conscious Sedation, Adult, Care After This sheet gives you information about how to care for yourself after your procedure. Your health care provider may also give you more specific instructions. If you have problems or questions, contact your health care provider. What can I expect after the procedure? After the procedure, it is common to have: Sleepiness for several hours. Impaired judgment for several hours. Difficulty with balance. Vomiting if you eat too soon. Follow these instructions at home: For the time period you were told by your health care provider: Rest. Do not participate in activities where you could fall or become injured. Do not drive or use machinery. Do not drink alcohol. Do not take sleeping pills or medicines that cause drowsiness. Do not make important decisions or sign legal documents. Do not take care of children on your own. Eating and drinking  Follow the diet recommended by your health care provider. Drink enough fluid to keep your urine pale yellow. If you vomit: Drink water, juice, or soup when you can drink without vomiting. Make sure you have little or no nausea before eating solid foods. General instructions Take over-the-counter and prescription medicines only as told by your health care provider. Have a responsible adult stay with you for the time you are told. It is important to have someone help care for you until you are awake and alert. Do not smoke. Keep all follow-up visits as told by your health care provider. This is important. Contact a health care provider if: You are still sleepy or having trouble with balance after 24 hours. You feel light-headed. You keep feeling nauseous or you keep vomiting. You develop a rash. You have a fever. You have redness or  swelling around the IV site. Get help right away if: You have trouble breathing. You have new-onset confusion at home. Summary After the procedure, it is common to feel sleepy, have impaired judgment, or feel nauseous if you eat too soon. Rest after you get home. Know the things you should not do after the procedure. Follow the diet recommended by your health care provider and drink enough fluid to keep your urine pale yellow. Get help right away if you have trouble breathing or new-onset confusion at home. This information is not intended to replace advice given to you by your health care provider. Make sure you discuss any questions you have with your health care provider. Document Revised: 05/03/2019 Document Reviewed: 11/29/2018 Elsevier Patient Education  2023 Elsevier Inc.   Bone Marrow Aspiration and Bone Marrow Biopsy, Adult, Care After This sheet gives you information about how to care for yourself after your procedure. Your health care provider may also give you more specific instructions. If you have problems or questions, contact your health care provider. What can I expect after the procedure? After the procedure, it is common to have: Mild pain and tenderness. Swelling. Bruising. Follow these instructions at home: Puncture site care  Follow instructions from your health care provider about how to take care of the puncture site. Make sure you: Wash your hands with soap and water before and after you change your bandage (dressing). If soap and water are not available, use hand sanitizer. Change your dressing as told by your health care provider. Check your puncture site every day for signs of infection. Check for: More redness, swelling, or pain.   Fluid or blood. Warmth. Pus or a bad smell. Activity Return to your normal activities as told by your health care provider. Ask your health care provider what activities are safe for you. Do not lift anything that is heavier  than 10 lb (4.5 kg), or the limit that you are told, until your health care provider says that it is safe. Do not drive for 24 hours if you were given a sedative during your procedure. General instructions  Take over-the-counter and prescription medicines only as told by your health care provider. Do not take baths, swim, or use a hot tub until your health care provider approves. Ask your health care provider if you may take showers. You may only be allowed to take sponge baths. If directed, put ice on the affected area. To do this: Put ice in a plastic bag. Place a towel between your skin and the bag. Leave the ice on for 20 minutes, 2-3 times a day. Keep all follow-up visits as told by your health care provider. This is important. Contact a health care provider if: Your pain is not controlled with medicine. You have a fever. You have more redness, swelling, or pain around the puncture site. You have fluid or blood coming from the puncture site. Your puncture site feels warm to the touch. You have pus or a bad smell coming from the puncture site. Summary After the procedure, it is common to have mild pain, tenderness, swelling, and bruising. Follow instructions from your health care provider about how to take care of the puncture site and what activities are safe for you. Take over-the-counter and prescription medicines only as told by your health care provider. Contact a health care provider if you have any signs of infection, such as fluid or blood coming from the puncture site. This information is not intended to replace advice given to you by your health care provider. Make sure you discuss any questions you have with your health care provider. Document Revised: 05/22/2018 Document Reviewed: 05/22/2018 Elsevier Patient Education  2023 Elsevier Inc.  

## 2022-03-09 NOTE — Consult Note (Signed)
Chief Complaint: Patient was seen in consultation today for image guided bone marrow biopsy  Referring Physician(s): Ennever,Peter R  Supervising Physician: Arne Cleveland  Patient Status: [facility] - Out-pt  History of Present Illness: [name] is a 67 y.o. female with PMH significant for anemia, anxiety, arthritis, coronary artery disease, depression, fibromyalgia, GERD, hiatal hernia, nephrolithiasis, hyperlipidemia, hypertension, inflammatory protein elevation/monoclonal myopathy, rheumatoid arthritis, Sjogren's syndrome.  Recent MRI of the L-spine on 01/03/2022 revealed diffusely abnormal bone marrow.  She presents today for image guided bone marrow biopsy for further evaluation.   Past Medical History:  Diagnosis Date   Anemia    history   Anxiety    Aortic atherosclerosis (HCC)    Arthritis    mild RA, secondary to Sjogren's    CAD (coronary artery disease)    Complication of anesthesia    Depression    history of, treated with med. & therapy   Fibromyalgia    GERD (gastroesophageal reflux disease)    Heart murmur 2010   told by Dr. Birdie Riddle - seen perhaps on ECHO?   Hiatal hernia    History of kidney stones    passed spontaneously   Hyperlipidemia    Hypertension    Iron deficiency anemia 02/18/2022   PONV (postoperative nausea and vomiting)    mild nausea post op   Rheumatoid arthritis(714.0)    Sjogren's syndrome (HCC)     Past Surgical History:  Procedure Laterality Date   ANTERIOR CERVICAL DECOMP/DISCECTOMY FUSION N/A 11/02/2017   Procedure: ANTERIOR CERVICAL DECOMPRESSION FUSION, CERVICAL 6-7 WITH INSTRUMENTATION AND ALLOGRAFT;  Surgeon: Phylliss Bob, MD;  Location: [place];  Service: Orthopedics;  Laterality: N/A;   LEFT HEART CATH AND CORONARY ANGIOGRAPHY N/A 08/12/2021   Procedure: LEFT HEART CATH AND CORONARY ANGIOGRAPHY;  Surgeon: Belva Crome, MD;  Location: [facility];  Service: Cardiovascular;  Laterality: N/A;   OOPHORECTOMY      1 ovary remain, removed adhesions on bowel   REDUCTION MAMMAPLASTY Bilateral 11/2009   TONSILLECTOMY     born without tonsils    VESICOVAGINAL FISTULA CLOSURE W/ TAH      Allergies: Amitriptyline, Latex, Amlodipine, and Lipitor [atorvastatin]  Medications: Prior to Admission medications   Medication Sig Start Date End Date Taking? Authorizing Provider  aspirin EC 81 MG tablet Take 1 tablet (81 mg total) by mouth daily. Swallow whole. 08/09/21   Dunn, Nedra Hai, PA-C  diclofenac (VOLTAREN) 50 MG EC tablet 1 tablet as needed Orally Twice a day for 14 days Patient not taking: Reported on 01/26/2022 12/22/21   [provider]  ibuprofen (ADVIL) 800 MG tablet Take 800 mg by mouth every 8 (eight) hours as needed.    [provider]  lisinopril-hydrochlorothiazide (ZESTORETIC) 20-25 MG tablet Take 1 tablet by mouth daily. 05/26/21   Eulogio Bear, NP  LORazepam (ATIVAN) 0.5 MG tablet Take one tab po qhs prn muscle spasm or severe anxiety. Do not drive with this medicine. 02/01/22   Allwardt, Randa Evens, PA-C  predniSONE (DELTASONE) 50 MG tablet Take one tablet po qd x 5 days with food. 02/01/22   Allwardt, Randa Evens, PA-C     Family History  Problem Relation Age of Onset   Coronary artery disease Mother    Hypertension Mother    Diabetes Mother    Heart attack Father    Hypertension Father    CVA Father    Stroke Maternal Grandmother    Heart attack Paternal Grandmother  Breast cancer Maternal Aunt     Social History   Socioeconomic History   Marital status: Married    Spouse name: Not on file   Number of children: 1   Years of education: Not on file   Highest education level: Not on file  Occupational History    Comment: [employer]    Employer: [employer]  Tobacco Use   Smoking status: Never    Passive exposure: Never   Smokeless tobacco: Never  Vaping Use   Vaping Use: Never used  Substance and Sexual Activity   Alcohol use: No   Drug use:  Never   Sexual activity: Not Currently  Other Topics Concern   Not on file  Social History Narrative   Not on file   Social Determinants of Health   Financial Resource Strain: Not on file  Food Insecurity: Not on file  Transportation Needs: Not on file  Physical Activity: Not on file  Stress: Not on file  Social Connections: Not on file    Review of Systems denies fever, headache, chest pain, dyspnea, cough, vomiting or bleeding.  She does have back pain. She is anxious.   Vital Signs: Blood pressure 147/79, temp 98.4, heart rate 65, respiration 20, O2 sat 97%RA      Physical Exam awake, alert.  Chest clear to auscultation bilaterally.  Heart with regular rate and rhythm.  Abdomen soft, positive bowel sounds, nontender.  No lower extremity edema.  Imaging: NM PET Image Initial (PI) Skull Base To Thigh  Result Date: 02/14/2022 CLINICAL DATA:  Initial treatment strategy for monoclonal gammopathy of undetermined significance. Evaluate for bone lesions secondary to myeloma EXAM: NUCLEAR MEDICINE PET SKULL BASE TO THIGH TECHNIQUE: 9.29 mCi F-18 FDG was injected intravenously. Full-ring PET imaging was performed from the skull base to thigh after the radiotracer. CT data was obtained and used for attenuation correction and anatomic localization. Fasting blood glucose: 88 mg/dl COMPARISON:  CT AP 03/24/2020 FINDINGS: Mediastinal blood pool activity: SUV max 3.60 Liver activity: SUV max NA NECK: No hypermetabolic lymph nodes in the neck. Incidental CT findings: None. CHEST: Morphologically benign appearing bilateral axillary and retropectoral lymph nodes exhibit mild increased uptake. For example: -Within the right axilla there is a 1.1 cm lymph node which maintains a fatty hilum and has an SUV max of 4.03. -Within the inferior right axilla there is a 0.7 cm lymph node within SUV max of 2.89. -left retropectoral lymph node measures 0.7 cm with SUV max of 2.41, image 48/4. No tracer avid  mediastinal or hilar lymph nodes. No tracer avid pulmonary nodule Incidental CT findings: Aortic atherosclerosis and coronary artery calcifications. Moderate to large hiatal hernia identified ABDOMEN/PELVIS: No abnormal hypermetabolic activity within the liver, pancreas, adrenal glands, or spleen. No hypermetabolic lymph nodes in the abdomen lymph nodes. Left external iliac lymph node measures 0.6 cm and has an SUV max of 3.11. Right external iliac lymph node measures 0.9 cm with SUV max of 3.06, image 163/4. No abnormal uptake identified within the spleen. . Incidental CT findings: No free fluid or fluid collections.Aortic atherosclerotic. Calcifications SKELETON: Mild diffuse increased radiotracer uptake throughout the imaged portions of the axial and appendicular skeleton. No tracer avid lesions of multiple myeloma identified. Incidental CT findings: On the corresponding CT images no lytic lesions identified. IMPRESSION: 1. No tracer avid lucent lesions of multiple myeloma identified. 2. Mild diffuse increased radiotracer uptake throughout the imaged portions of the axial and appendicular skeleton is noted. This  is a nonspecific finding and can be seen in the setting of underlying marrow disorder. 3. Morphologically benign appearing bilateral axillary, external iliac and retropectoral lymph nodes exhibit mild increased radiotracer uptake. The most tracer avid lymph node is in the right axilla within SUV max of 4.03 (which is just above background blood pool activity. Morphologically this lymph node maintains a normal fatty hilum. 4. Moderate to large hiatal hernia. 5. Coronary artery calcifications. Electronically Signed   By: Kerby Moors M.D.   On: 02/14/2022 15:15    Labs:  CBC: Recent Labs    12/23/21 1216 01/26/22 1102 02/18/22 0922 03/09/22 0900  WBC 6.0 6.0 7.0 6.7  HGB 10.7* 10.9* 11.3* 10.7*  HCT 33.1* 35.0* 36.6 35.0*  PLT 263.0 210 256 268    COAGS: No results for input(s): "INR",  "APTT" in the last 8760 hours.  BMP: Recent Labs    05/26/21 1942 07/01/21 1339 08/09/21 1548 12/23/21 1216 01/26/22 1102 02/18/22 0922  NA 139   < > 136 137 136 138  K 5.1   < > 4.2 4.2 3.6 4.3  CL 109   < > 101 102 102 103  CO2 23   < > 21 28 26 28  $ GLUCOSE 99   < > 93 92 65* 98  BUN 15   < > 14 12 14 11  $ CALCIUM 9.4   < > 9.5 9.5  9.4 10.1 10.4*  CREATININE 0.86   < > 0.73 0.77 0.77 0.87  GFRNONAA >60  --   --   --  >60 >60   < > = values in this interval not displayed.    LIVER FUNCTION TESTS: Recent Labs    08/09/21 1548 12/23/21 1216 01/26/22 1102 02/18/22 0922  BILITOT <0.2 0.3 0.3 0.3  AST 32 34 30 30  ALT 30 30 23 23  $ ALKPHOS 96 81 76 90  PROT 8.8* 8.6*  8.7* 9.3* 9.4*  ALBUMIN 3.9 3.8 3.9 3.9    TUMOR MARKERS: No results for input(s): "AFPTM", "CEA", "CA199", "CHROMGRNA" in the last 8760 hours.  Assessment and Plan: 67 y.o. female with PMH significant for anemia, anxiety, arthritis, coronary artery disease, depression, fibromyalgia, GERD, hiatal hernia, nephrolithiasis, hyperlipidemia, hypertension, inflammatory protein elevation/monoclonal myopathy, rheumatoid arthritis, Sjogren's syndrome.  Recent MRI of the L-spine on 01/03/2022 revealed diffusely abnormal bone marrow.  She presents today for image guided bone marrow biopsy for further evaluation.Risks and benefits of procedure was discussed with the patient/spouse including, but not limited to bleeding, infection, damage to adjacent structures or low yield requiring additional tests.  All of the questions were answered and there is agreement to proceed.  Consent signed and in chart.    Thank you for this interesting consult.  I greatly enjoyed meeting [name] and look forward to participating in their care.  A copy of this report was sent to the requesting provider on this date.  Electronically Signed: D. Rowe Robert, PA-C 03/09/2022, 9:32 AM   I spent a total of  20 minutes   in face  to face in clinical consultation, greater than 50% of which was counseling/coordinating care for image guided bone marrow biopsy

## 2022-03-14 LAB — SURGICAL PATHOLOGY

## 2022-03-17 ENCOUNTER — Encounter (HOSPITAL_COMMUNITY): Payer: Self-pay | Admitting: Hematology & Oncology

## 2022-03-21 ENCOUNTER — Encounter (HOSPITAL_COMMUNITY): Payer: Self-pay | Admitting: Hematology & Oncology

## 2022-03-21 ENCOUNTER — Telehealth: Payer: Self-pay | Admitting: *Deleted

## 2022-03-21 NOTE — Telephone Encounter (Addendum)
-----   Message from Volanda Napoleon, MD sent at 03/21/2022  3:16 PM EST ----- Message copied from Dr Shirl Harris message:   "I called and left a message on her cell phone about the bone marrow report.  The bone marrow did not show any myeloma.  She has what looks like MGUS.  This does not need to be treated and we just follow this along with her lab work.  I told her that she can always give the office a call back if she needs to"

## 2022-03-24 ENCOUNTER — Ambulatory Visit: Payer: 59 | Admitting: Physician Assistant

## 2022-03-24 ENCOUNTER — Encounter: Payer: Self-pay | Admitting: Physician Assistant

## 2022-03-24 VITALS — BP 128/76 | HR 94 | Temp 97.5°F | Ht 64.0 in | Wt 187.2 lb

## 2022-03-24 DIAGNOSIS — K449 Diaphragmatic hernia without obstruction or gangrene: Secondary | ICD-10-CM | POA: Diagnosis not present

## 2022-03-24 DIAGNOSIS — M05751 Rheumatoid arthritis with rheumatoid factor of right hip without organ or systems involvement: Secondary | ICD-10-CM

## 2022-03-24 DIAGNOSIS — M3500 Sicca syndrome, unspecified: Secondary | ICD-10-CM | POA: Diagnosis not present

## 2022-03-24 DIAGNOSIS — D472 Monoclonal gammopathy: Secondary | ICD-10-CM | POA: Diagnosis not present

## 2022-03-24 DIAGNOSIS — M05752 Rheumatoid arthritis with rheumatoid factor of left hip without organ or systems involvement: Secondary | ICD-10-CM

## 2022-03-24 NOTE — Progress Notes (Signed)
Subjective:    Patient ID: [name], female    DOB: [birth year], 67 y.o.   MRN: [id number]  Chief Complaint  Patient presents with   Follow-up    Pt in office to discuss recent labs and hernia with PCP; pt went to the cancer center and was upset about vm she received and getting results via vm and wants referral. Pt states she is very tired needing iron infusions.     HPI Patient is in today for discussion about recent PET scan and bone marrow biopsy. Also requesting some referrals. She is scheduling with pain management. See A/P.  Past Medical History:  Diagnosis Date   Anemia    history   Anxiety    Aortic atherosclerosis (HCC)    Arthritis    mild RA, secondary to Sjogren's    CAD (coronary artery disease)    Complication of anesthesia    Depression    history of, treated with med. & therapy   Fibromyalgia    GERD (gastroesophageal reflux disease)    Heart murmur 2010   told by Dr. Birdie Riddle - seen perhaps on ECHO?   Hiatal hernia    History of kidney stones    passed spontaneously   Hyperlipidemia    Hypertension    Iron deficiency anemia 02/18/2022   PONV (postoperative nausea and vomiting)    mild nausea post op   Rheumatoid arthritis(714.0)    Sjogren's syndrome (HCC)     Past Surgical History:  Procedure Laterality Date   ANTERIOR CERVICAL DECOMP/DISCECTOMY FUSION N/A 11/02/2017   Procedure: ANTERIOR CERVICAL DECOMPRESSION FUSION, CERVICAL 6-7 WITH INSTRUMENTATION AND ALLOGRAFT;  Surgeon: Phylliss Bob, MD;  Location: [facility];  Service: Orthopedics;  Laterality: N/A;   IR BONE MARROW BIOPSY & ASPIRATION  03/09/2022   LEFT HEART CATH AND CORONARY ANGIOGRAPHY N/A 08/12/2021   Procedure: LEFT HEART CATH AND CORONARY ANGIOGRAPHY;  Surgeon: Belva Crome, MD;  Location: [facility];  Service: Cardiovascular;  Laterality: N/A;   OOPHORECTOMY     1 ovary remain, removed adhesions on bowel   REDUCTION MAMMAPLASTY Bilateral 11/2009   TONSILLECTOMY      born without tonsils    VESICOVAGINAL FISTULA CLOSURE W/ TAH      Family History  Problem Relation Age of Onset   Coronary artery disease Mother    Hypertension Mother    Diabetes Mother    Heart attack Father    Hypertension Father    CVA Father    Stroke Maternal Grandmother    Heart attack Paternal Grandmother    Breast cancer Maternal Aunt     Social History   Tobacco Use   Smoking status: Never    Passive exposure: Never   Smokeless tobacco: Never  Vaping Use   Vaping Use: Never used  Substance Use Topics   Alcohol use: No   Drug use: Never     Allergies  Allergen Reactions   Amitriptyline Swelling    Leg swelling    Latex Other (See Comments)    Burns skin   Amlodipine     Muscle cramps, felt dehydrated (at 2.'5mg'$  dose)   Lipitor [Atorvastatin] Itching    Review of Systems NEGATIVE UNLESS OTHERWISE INDICATED IN HPI      Objective:     BP 128/76 (BP Location: Left Arm)   Pulse 94   Temp (!) 97.5 F (36.4 C) (Temporal)   Ht '5\' 4"'$  (1.626 m)   Wt 187 lb 3.2 oz (  84.9 kg)   SpO2 99%   BMI 32.13 kg/m   Wt Readings from Last 3 Encounters:  03/24/22 187 lb 3.2 oz (84.9 kg)  03/09/22 183 lb (83 kg)  02/18/22 187 lb 12.8 oz (85.2 kg)    BP Readings from Last 3 Encounters:  03/24/22 128/76  03/09/22 139/86  03/09/22 136/78     Physical Exam Vitals and nursing note reviewed.  Constitutional:      Appearance: Normal appearance. She is normal weight. She is not toxic-appearing.  HENT:     Head: Normocephalic and atraumatic.  Eyes:     Extraocular Movements: Extraocular movements intact.     Conjunctiva/sclera: Conjunctivae normal.     Pupils: Pupils are equal, round, and reactive to light.  Cardiovascular:     Rate and Rhythm: Normal rate and regular rhythm.     Pulses: Normal pulses.     Heart sounds: Normal heart sounds.  Pulmonary:     Effort: Pulmonary effort is normal.     Breath sounds: Normal breath sounds.  Musculoskeletal:         General: Normal range of motion.     Cervical back: Normal range of motion and neck supple.  Skin:    General: Skin is warm and dry.  Neurological:     General: No focal deficit present.     Mental Status: She is alert and oriented to person, place, and time.  Psychiatric:        Mood and Affect: Mood normal.        Behavior: Behavior normal.        Assessment & Plan:  MGUS (monoclonal gammopathy of unknown significance) Assessment & Plan: Following with hematology / oncology Biopsy and PET scan negative for malignancy    SJOGREN'S SYNDROME Assessment & Plan: Bosworth, MD  Patient requested referral - placed today   Orders: -     Ambulatory referral to Rheumatology  Large hiatal hernia Assessment & Plan: Symptomatic with GERD. Requesting referral to Dr. Doreatha Massed, sent today.   Orders: -     Ambulatory referral to General Surgery  Rheumatoid arthritis involving both hips with positive rheumatoid factor (HCC) Assessment & Plan: Significant pain. She is going to be following with pain management.       Return in about 3 months (around 06/24/2022) for CPE, fasting labs.  This note was prepared with assistance of Systems analyst. Occasional wrong-word or sound-a-like substitutions may have occurred due to the inherent limitations of voice recognition software.  Time Spent: 32 minutes of total time was spent on the date of the encounter performing the following actions: chart review prior to seeing the patient, obtaining history, performing a medically necessary exam, counseling on the treatment plan, placing orders, and documenting in our EHR.       Niki Cosman M Sanay Belmar, PA-C

## 2022-03-28 ENCOUNTER — Telehealth: Payer: Self-pay | Admitting: Physician Assistant

## 2022-03-28 NOTE — Telephone Encounter (Signed)
Pt states her job did not get the FMLA paperwork, please resend to this fax number: [phone]. Please have original for pt to pick up.

## 2022-03-29 ENCOUNTER — Ambulatory Visit: Payer: 59 | Admitting: Physician Assistant

## 2022-03-29 ENCOUNTER — Telehealth: Payer: Self-pay | Admitting: *Deleted

## 2022-03-29 NOTE — Telephone Encounter (Signed)
Called pt and lvm that FMLA paperwork was refaxed to employer and original paperwork is here for pickup. Advised call back number in case it is needed.

## 2022-03-29 NOTE — Telephone Encounter (Signed)
Per scheduling message Sheria Lang - called patient and lvm of upcoming appointment - requested call back to confirm.

## 2022-03-30 NOTE — Assessment & Plan Note (Signed)
Following with hematology / oncology Biopsy and PET scan negative for malignancy

## 2022-03-30 NOTE — Assessment & Plan Note (Signed)
Significant pain. She is going to be following with pain management.

## 2022-03-30 NOTE — Assessment & Plan Note (Signed)
Symptomatic with GERD. Requesting referral to Dr. Doreatha Massed, sent today.

## 2022-03-30 NOTE — Assessment & Plan Note (Signed)
[facility] - Marlis Edelson, MD  Patient requested referral - placed today

## 2022-04-19 ENCOUNTER — Inpatient Hospital Stay: Payer: 59

## 2022-04-19 ENCOUNTER — Inpatient Hospital Stay: Payer: 59 | Attending: Hematology & Oncology | Admitting: Hematology & Oncology

## 2022-04-19 ENCOUNTER — Encounter: Payer: Self-pay | Admitting: Hematology & Oncology

## 2022-04-19 VITALS — BP 147/65 | HR 90 | Temp 98.8°F | Resp 20 | Ht 64.0 in | Wt 188.0 lb

## 2022-04-19 DIAGNOSIS — D500 Iron deficiency anemia secondary to blood loss (chronic): Secondary | ICD-10-CM | POA: Diagnosis not present

## 2022-04-19 DIAGNOSIS — D509 Iron deficiency anemia, unspecified: Secondary | ICD-10-CM | POA: Diagnosis present

## 2022-04-19 DIAGNOSIS — R768 Other specified abnormal immunological findings in serum: Secondary | ICD-10-CM | POA: Insufficient documentation

## 2022-04-19 DIAGNOSIS — M3500 Sicca syndrome, unspecified: Secondary | ICD-10-CM | POA: Diagnosis not present

## 2022-04-19 DIAGNOSIS — D472 Monoclonal gammopathy: Secondary | ICD-10-CM | POA: Diagnosis not present

## 2022-04-19 DIAGNOSIS — K449 Diaphragmatic hernia without obstruction or gangrene: Secondary | ICD-10-CM | POA: Insufficient documentation

## 2022-04-19 DIAGNOSIS — M069 Rheumatoid arthritis, unspecified: Secondary | ICD-10-CM | POA: Diagnosis not present

## 2022-04-19 DIAGNOSIS — D638 Anemia in other chronic diseases classified elsewhere: Secondary | ICD-10-CM | POA: Diagnosis not present

## 2022-04-19 DIAGNOSIS — M549 Dorsalgia, unspecified: Secondary | ICD-10-CM | POA: Diagnosis not present

## 2022-04-19 NOTE — Progress Notes (Signed)
Hematology and Oncology Follow Up Visit  ALETTE SCHAFER [id number] [birth year] 67 y.o. 04/19/2022   Principle Diagnosis:  Inflammatory protein elevation  Iron deficiency anemia/anemia of chronic disease Rheumatoid arthritis/Sjogren's syndrome  Current Therapy:   Observation     Interim History:  Ms. Rients is back for follow-up.  The big news is that she is going to have hiatal hernia surgery.  I think she sees a Psychologist, sport and exercise this week.  She is doing well with respect to the MGUS.  Actually, we really cannot even find an abnormal protein on SPEP.  Her IgG level is elevated at 3213 mg/dL.  Her IgA is also slightly elevated at 362 mg/dL.  She does have an element of iron deficiency.  When we saw her, her ferritin was 21 with an iron saturation of 15%.  If she needs to have surgery for the hiatal hernia, I do not see a problem from my point of view.  She is having no issues with nausea or vomiting.  She does have some reflux.  She did have a bone marrow biopsy done on 03/09/2022.  The pathology report ([id number]) showed 5-6% plasma cells.  There is no monoclonal cells noted.  She did have FISH panel which was actually positive for monosomy 13 and monosomy 17.  As such, we will have to watch her labs closely.  Currently, I would have to say that her performance status is probably ECOG 1.  Medications:  Current Outpatient Medications:    aspirin EC 81 MG tablet, Take 1 tablet (81 mg total) by mouth daily. Swallow whole., Disp: 90 tablet, Rfl: 3   esomeprazole (NEXIUM) 40 MG capsule, Take 40 mg by mouth daily as needed., Disp: , Rfl:    ibuprofen (ADVIL) 800 MG tablet, Take 800 mg by mouth every 8 (eight) hours as needed., Disp: , Rfl:    lisinopril-hydrochlorothiazide (ZESTORETIC) 20-25 MG tablet, Take 1 tablet by mouth daily., Disp: 90 tablet, Rfl: 3   LORazepam (ATIVAN) 0.5 MG tablet, Take one tab po qhs prn muscle spasm or severe anxiety. Do not drive with this medicine., Disp: 10  tablet, Rfl: 0   methocarbamol (ROBAXIN) 750 MG tablet, Take 750 mg by mouth every 8 (eight) hours as needed. (Patient not taking: Reported on 04/19/2022), Disp: , Rfl:   Current Facility-Administered Medications:    sodium chloride flush (NS) 0.9 % injection 3 mL, 3 mL, Intravenous, Q12H, Dunn, Dayna N, PA-C  Allergies:  Allergies  Allergen Reactions   Amitriptyline Swelling    Leg swelling    Latex Other (See Comments)    Burns skin   Amlodipine     Muscle cramps, felt dehydrated (at 2.5mg  dose)   Lipitor [Atorvastatin] Itching    Past Medical History, Surgical history, Social history, and Family History were reviewed and updated.  Review of Systems: Review of Systems  Constitutional: Negative.   HENT:  Negative.    Eyes: Negative.   Respiratory: Negative.    Cardiovascular: Negative.   Gastrointestinal: Negative.   Endocrine: Negative.   Genitourinary: Negative.    Musculoskeletal:  Positive for back pain.  Skin: Negative.   Neurological: Negative.   Hematological: Negative.   Psychiatric/Behavioral:  The patient is nervous/anxious.     Physical Exam:  height is 5\' 4"  (1.626 m) and weight is 188 lb (85.3 kg). Her oral temperature is 98.8 F (37.1 C). Her blood pressure is 147/65 (abnormal) and her pulse is 90. Her respiration is 20 and oxygen saturation is 100%.  Wt Readings from Last 3 Encounters:  04/19/22 188 lb (85.3 kg)  03/24/22 187 lb 3.2 oz (84.9 kg)  03/09/22 183 lb (83 kg)    Physical Exam Vitals reviewed.  HENT:     Head: Normocephalic and atraumatic.  Eyes:     Pupils: Pupils are equal, round, and reactive to light.  Cardiovascular:     Rate and Rhythm: Normal rate and regular rhythm.     Heart sounds: Normal heart sounds.  Pulmonary:     Effort: Pulmonary effort is normal.     Breath sounds: Normal breath sounds.  Abdominal:     General: Bowel sounds are normal.     Palpations: Abdomen is soft.  Musculoskeletal:        General: No  tenderness or deformity. Normal range of motion.     Cervical back: Normal range of motion.  Lymphadenopathy:     Cervical: No cervical adenopathy.  Skin:    General: Skin is warm and dry.     Findings: No erythema or rash.  Neurological:     Mental Status: She is alert and oriented to person, place, and time.  Psychiatric:        Behavior: Behavior normal.        Thought Content: Thought content normal.        Judgment: Judgment normal.      Lab Results  Component Value Date   WBC 6.7 03/09/2022   HGB 10.7 (L) 03/09/2022   HCT 35.0 (L) 03/09/2022   MCV 83.9 03/09/2022   PLT 268 03/09/2022     Chemistry      Component Value Date/Time   NA 138 02/18/2022 0922   NA 136 08/09/2021 1548   K 4.3 02/18/2022 0922   CL 103 02/18/2022 0922   CO2 28 02/18/2022 0922   BUN 11 02/18/2022 0922   BUN 14 08/09/2021 1548   CREATININE 0.87 02/18/2022 0922      Component Value Date/Time   CALCIUM 10.4 (H) 02/18/2022 0922   ALKPHOS 90 02/18/2022 0922   AST 30 02/18/2022 0922   ALT 23 02/18/2022 0922   BILITOT 0.3 02/18/2022 0922     Clinical blood smear, there is normochromic and normocytic population of red blood cells.  I see no nucleated red blood cells.  There are no teardrop cells.  There is no rouleaux formation.  Her white cells appear normal in morphology and maturation.  There is no immature myeloid or lymphoid forms.  There is no circulating plasma cells..  There are no hypersegmented polys.  Platelets are adequate in number and size.    Impression and Plan: Ms. Avitabile is a very nice 67 year old African-American female.  She has rheumatologic issues.  She has back discomfort.  She has iron deficiency.  I am glad that we did the bone marrow biopsy on her.  I am little surprised by the [facility] panel.  We will have to monitor her proteins closely.  I suspect that she might be at a higher risk for the MGUS to possibly develop into myeloma.  However, I think this would take several  years.  Again I do not see a problem with her having surgery for her hiatal hernia.  I would like to see her back in June.  We will monitor her iron levels and her protein studies at that time.    Volanda Napoleon, MD 4/2/20249:24 AM

## 2022-06-13 ENCOUNTER — Other Ambulatory Visit: Payer: Self-pay | Admitting: Nurse Practitioner

## 2022-06-14 NOTE — Telephone Encounter (Signed)
Requested Prescriptions  Pending Prescriptions Disp Refills   lisinopril-hydrochlorothiazide (ZESTORETIC) 20-25 MG tablet [Pharmacy Med Name: LISINOPRIL-HCTZ 20-25 MG TAB] 90 tablet 3    Sig: TAKE 1 TABLET BY MOUTH EVERY DAY     Cardiovascular:  ACEI + Diuretic Combos Failed - 06/13/2022  2:41 AM      Failed - Last BP in normal range    BP Readings from Last 1 Encounters:  04/19/22 (!) 147/65         Failed - Valid encounter within last 6 months    Recent Outpatient Visits           7 years ago Muscle spasms of neck   [facility], Asencion Partridge, MD       Future Appointments             In 1 month Allwardt, Crist Infante, PA-C [facility]            Passed - Na in normal range and within 180 days    Sodium  Date Value Ref Range Status  02/18/2022 138 135 - 145 mmol/L Final  08/09/2021 136 134 - 144 mmol/L Final         Passed - K in normal range and within 180 days    Potassium  Date Value Ref Range Status  02/18/2022 4.3 3.5 - 5.1 mmol/L Final         Passed - Cr in normal range and within 180 days    Creatinine  Date Value Ref Range Status  02/18/2022 0.87 0.44 - 1.00 mg/dL Final         Passed - eGFR is 30 or above and within 180 days    GFR calc Af Amer  Date Value Ref Range Status  11/01/2017 >60 >60 mL/min Final    Comment:    (NOTE) The eGFR has been calculated using the CKD EPI equation. This calculation has not been validated in all clinical situations. eGFR's persistently <60 mL/min signify possible Chronic Kidney Disease.    GFR, Estimated  Date Value Ref Range Status  02/18/2022 >60 >60 mL/min Final    Comment:    (NOTE) Calculated using the CKD-EPI Creatinine Equation (2021)    GFR  Date Value Ref Range Status  12/23/2021 81.49 >60.00 mL/min Final    Comment:    Calculated using the CKD-EPI Creatinine Equation (2021)   eGFR  Date Value Ref Range Status  08/09/2021 92 >59 mL/min/1.73 Final          Passed - Patient is not pregnant

## 2022-07-05 ENCOUNTER — Ambulatory Visit: Payer: 59 | Admitting: Hematology & Oncology

## 2022-07-05 ENCOUNTER — Other Ambulatory Visit: Payer: 59

## 2022-07-08 ENCOUNTER — Encounter: Payer: 59 | Admitting: Physician Assistant

## 2022-07-08 ENCOUNTER — Ambulatory Visit
Admission: EM | Admit: 2022-07-08 | Discharge: 2022-07-08 | Disposition: A | Discharge status: Home / Self Care | Payer: 59 | Attending: Family Medicine | Admitting: Family Medicine

## 2022-07-08 DIAGNOSIS — Z1152 Encounter for screening for COVID-19: Secondary | ICD-10-CM | POA: Diagnosis not present

## 2022-07-08 DIAGNOSIS — J019 Acute sinusitis, unspecified: Secondary | ICD-10-CM | POA: Diagnosis not present

## 2022-07-08 MED ORDER — AMOXICILLIN-POT CLAVULANATE 875-125 MG PO TABS: 1.0000 | ORAL_TABLET | Freq: Two times a day (BID) | ORAL | 0 refills | Status: AC

## 2022-07-08 MED ORDER — BENZONATATE 200 MG PO CAPS: 200.0000 mg | ORAL_CAPSULE | Freq: Three times a day (TID) | ORAL | 0 refills | Status: DC | PRN

## 2022-07-08 NOTE — ED Triage Notes (Signed)
Patient with c/o cough since last Wednesday. Patient states she is coughing up green/yellow phlegm. Patient denies fevers.

## 2022-07-08 NOTE — Discharge Instructions (Signed)
I am treating him for signs infection.  Your COVID test pending and will result within 24 to 48 hours.

## 2022-07-08 NOTE — ED Provider Notes (Signed)
[facility]    CSN: [id number] Arrival date & time: 07/08/22  0906      History   Chief Complaint Chief Complaint  Patient presents with   Cough    HPI [name] is a 67 y.o. female.   HPI Patient is today concerned with a worsening productive cough.  Patient has a history significant for autoimmune disease and chronic lung disease.  Patient reports she initially began to have some sinus symptoms and productive cough for over a week.  Endorses that her cough is occasionally productive.  She reports few of her coworkers recently have tested positive for COVID and to be cautious she would like to be tested today. She has not had a fever, no bodyaches, and denies any other worrisome symptoms except the cough.  She has Sjogren Syndrome Chronic Lung Disease denies any worsening of shortness of breath or wheezing. Patient denies any other symptoms.  Past Medical History:  Diagnosis Date   Anemia    history   Anxiety    Aortic atherosclerosis (HCC)    Arthritis    mild RA, secondary to Sjogren's    CAD (coronary artery disease)    Complication of anesthesia    Depression    history of, treated with med. & therapy   Fibromyalgia    GERD (gastroesophageal reflux disease)    Heart murmur 2010   told by Dr. Beverely Low - seen perhaps on ECHO?   Hiatal hernia    History of kidney stones    passed spontaneously   Hyperlipidemia    Hypertension    Iron deficiency anemia 02/18/2022   PONV (postoperative nausea and vomiting)    mild nausea post op   Rheumatoid arthritis(714.0)    Sjogren's syndrome [facility])     Patient Active Problem List   Diagnosis Date Noted   Iron deficiency anemia 02/18/2022   MGUS (monoclonal gammopathy of unknown significance) 02/02/2022   Fibromyalgia 02/02/2022   Daytime sleepiness 10/07/2021   CAD in native artery    Angina pectoris [facility])    Status post surgery 11/02/2017   Cervical radiculopathy 08/25/2016   Cervicalgia 03/22/2016    Chronic left-sided low back pain without sciatica 03/22/2016   Acute midline low back pain without sciatica 02/29/2016   Cough 07/18/2011   GERD (gastroesophageal reflux disease) 06/07/2011   Sinusitis acute 06/07/2011   Epigastric pain 04/13/2011   Conjunctivitis 04/05/2011   Costochondritis 03/30/2011   LYMPHADENOPATHY 03/24/2010   CARDIAC MURMUR 01/29/2010   CHEST PAIN UNSPECIFIED 01/29/2010   SHINGLES 10/05/2009   DYSPHAGIA 07/27/2009   FATTY LIVER DISEASE 07/21/2009   HYPERLIPIDEMIA 07/17/2009   ANXIETY 07/17/2009   HYPERTENSION 07/17/2009   Large hiatal hernia 07/17/2009   SJOGREN'S SYNDROME 07/17/2009   Rheumatoid arthritis (HCC) 07/17/2009   ANOSMIA 07/17/2009   COMPRESSION FRACTURE, SPINE 07/17/2009    Past Surgical History:  Procedure Laterality Date   ANTERIOR CERVICAL DECOMP/DISCECTOMY FUSION N/A 11/02/2017   Procedure: ANTERIOR CERVICAL DECOMPRESSION FUSION, CERVICAL 6-7 WITH INSTRUMENTATION AND ALLOGRAFT;  Surgeon: Estill Bamberg, MD;  Location: [facility];  Service: Orthopedics;  Laterality: N/A;   IR BONE MARROW BIOPSY & ASPIRATION  03/09/2022   LEFT HEART CATH AND CORONARY ANGIOGRAPHY N/A 08/12/2021   Procedure: LEFT HEART CATH AND CORONARY ANGIOGRAPHY;  Surgeon: Lyn Records, MD;  Location: [facility];  Service: Cardiovascular;  Laterality: N/A;   OOPHORECTOMY     1 ovary remain, removed adhesions on bowel   REDUCTION MAMMAPLASTY Bilateral 11/2009  TONSILLECTOMY     born without tonsils    VESICOVAGINAL FISTULA CLOSURE W/ TAH      OB History   No obstetric history on file.      Home Medications    Prior to Admission medications   Medication Sig Start Date End Date Taking? Authorizing Provider  amoxicillin-clavulanate (AUGMENTIN) 875-125 MG tablet Take 1 tablet by mouth 2 (two) times daily for 10 days. 07/08/22 07/18/22 Yes Bing Neighbors, NP  benzonatate (TESSALON) 200 MG capsule Take 1 capsule (200 mg total) by mouth 3 (three) times daily  as needed for cough. 07/08/22  Yes Bing Neighbors, NP  aspirin EC 81 MG tablet Take 1 tablet (81 mg total) by mouth daily. Swallow whole. 08/09/21   Dunn, Tacey Ruiz, PA-C  esomeprazole (NEXIUM) 40 MG capsule Take 40 mg by mouth daily as needed. 04/13/07   [provider]  ibuprofen (ADVIL) 800 MG tablet Take 800 mg by mouth every 8 (eight) hours as needed.    [provider]  lisinopril-hydrochlorothiazide (ZESTORETIC) 20-25 MG tablet Take 1 tablet by mouth daily. 05/26/21   Valentino Nose, NP  LORazepam (ATIVAN) 0.5 MG tablet Take one tab po qhs prn muscle spasm or severe anxiety. Do not drive with this medicine. 02/01/22   Allwardt, Crist Infante, PA-C  methocarbamol (ROBAXIN) 750 MG tablet Take 750 mg by mouth every 8 (eight) hours as needed. Patient not taking: Reported on 04/19/2022 03/21/22   [provider]    Family History Family History  Problem Relation Age of Onset   Coronary artery disease Mother    Hypertension Mother    Diabetes Mother    Heart attack Father    Hypertension Father    CVA Father    Stroke Maternal Grandmother    Heart attack Paternal Grandmother    Breast cancer Maternal Aunt     Social History Social History   Tobacco Use   Smoking status: Never    Passive exposure: Never   Smokeless tobacco: Never  Vaping Use   Vaping Use: Never used  Substance Use Topics   Alcohol use: No   Drug use: Never     Allergies   Amitriptyline, Latex, Amlodipine, and Lipitor [atorvastatin]   Review of Systems Review of Systems Pertinent negatives listed in HPI  Physical Exam Triage Vital Signs ED Triage Vitals  Enc Vitals Group     BP 07/08/22 0939 135/81     Pulse Rate 07/08/22 0939 73     Resp 07/08/22 0939 18     Temp 07/08/22 0939 98.3 F (36.8 C)     Temp Source 07/08/22 0939 Oral     SpO2 07/08/22 0939 96 %     Weight --      Height --      Head Circumference --      Peak Flow --      Pain Score 07/08/22 0938 0     Pain  Loc --      Pain Edu? --      Excl. in GC? --    No data found.  Updated Vital Signs BP 135/81 (BP Location: Left Arm)   Pulse 73   Temp 98.3 F (36.8 C) (Oral)   Resp 18   SpO2 96%   Visual Acuity Right Eye Distance:   Left Eye Distance:   Bilateral Distance:    Right Eye Near:   Left Eye Near:    Bilateral Near:  Physical Exam Vitals reviewed.  Constitutional:      Appearance: Normal appearance. She is not ill-appearing.  HENT:     Head: Normocephalic and atraumatic.     Nose: Nasal tenderness, mucosal edema, congestion and rhinorrhea present.     Right Turbinates: Enlarged.     Left Turbinates: Enlarged.  Eyes:     Extraocular Movements: Extraocular movements intact.     Pupils: Pupils are equal, round, and reactive to light.  Cardiovascular:     Rate and Rhythm: Normal rate and regular rhythm.  Pulmonary:     Effort: Pulmonary effort is normal.     Breath sounds: Normal breath sounds.  Musculoskeletal:     Cervical back: Normal range of motion.  Lymphadenopathy:     Cervical: No cervical adenopathy.  Skin:    General: Skin is warm and dry.  Neurological:     General: No focal deficit present.     Mental Status: She is alert.      UC Treatments / Results  Labs (all labs ordered are listed, but only abnormal results are displayed) Labs Reviewed  SARS CORONAVIRUS 2 (TAT 6-24 HRS)    EKG   Radiology No results found.  Procedures Procedures (including critical care time)  Medications Ordered in UC Medications - No data to display  Initial Impression / Assessment and Plan / UC Course  I have reviewed the triage vital signs and the nursing notes.  Pertinent labs & imaging results that were available during my care of the patient were reviewed by me and considered in my medical decision making (see chart for details).   Treating for acute nonrecurrent sinusitis however given patient's recent exposure to COVID-19 and the fact that she is  immunosuppressed due to autoimmune disease will obtain a COVID PCR.  Will treat [place] for sinusitis with Augmentin twice daily for 10 days.  Benzonatate Perles 200 mg 3 times daily for cough as needed. Patient advised to return if symptoms do not improve. Final Clinical Impressions(s) / UC Diagnoses   Final diagnoses:  Encounter for screening for COVID-19  Acute non-recurrent sinusitis, unspecified location     Discharge Instructions      I am treating him for signs infection.  Your COVID test pending and will result within 24 to 48 hours.      ED Prescriptions     Medication Sig Dispense Auth. Provider   amoxicillin-clavulanate (AUGMENTIN) 875-125 MG tablet Take 1 tablet by mouth 2 (two) times daily for 10 days. 20 tablet Bing Neighbors, NP   benzonatate (TESSALON) 200 MG capsule Take 1 capsule (200 mg total) by mouth 3 (three) times daily as needed for cough. 20 capsule Bing Neighbors, NP      PDMP not reviewed this encounter.   Bing Neighbors, NP 07/08/22 1156

## 2022-07-09 LAB — SARS CORONAVIRUS 2 (TAT 6-24 HRS): SARS Coronavirus 2: NEGATIVE

## 2022-07-19 ENCOUNTER — Telehealth: Payer: Self-pay | Admitting: Physician Assistant

## 2022-07-19 NOTE — Telephone Encounter (Signed)
Received and will place in provider box

## 2022-07-19 NOTE — Telephone Encounter (Signed)
Patient faxed document work accomodation form , to be filled out by provider. Patient requested to send it back via [phone]   Document is located in providers tray at front office.Please advise at Mobile [phone] (mobile)

## 2022-07-20 NOTE — Telephone Encounter (Signed)
Faxed paperwork at patient request and received confirmation it was received.

## 2022-07-21 ENCOUNTER — Other Ambulatory Visit: Payer: Self-pay

## 2022-07-21 ENCOUNTER — Emergency Department (HOSPITAL_COMMUNITY)
Admission: EM | Admit: 2022-07-21 | Discharge: 2022-07-21 | Disposition: A | Discharge status: Home / Self Care | Payer: 59 | Attending: Emergency Medicine | Admitting: Emergency Medicine

## 2022-07-21 ENCOUNTER — Emergency Department (HOSPITAL_COMMUNITY): Payer: 59

## 2022-07-21 ENCOUNTER — Encounter (HOSPITAL_COMMUNITY): Payer: Self-pay

## 2022-07-21 DIAGNOSIS — R109 Unspecified abdominal pain: Secondary | ICD-10-CM | POA: Insufficient documentation

## 2022-07-21 DIAGNOSIS — Z79899 Other long term (current) drug therapy: Secondary | ICD-10-CM | POA: Diagnosis not present

## 2022-07-21 DIAGNOSIS — M791 Myalgia, unspecified site: Secondary | ICD-10-CM | POA: Diagnosis present

## 2022-07-21 DIAGNOSIS — M7918 Myalgia, other site: Secondary | ICD-10-CM

## 2022-07-21 DIAGNOSIS — Z91040 Latex allergy status: Secondary | ICD-10-CM | POA: Diagnosis not present

## 2022-07-21 DIAGNOSIS — Z7982 Long term (current) use of aspirin: Secondary | ICD-10-CM | POA: Insufficient documentation

## 2022-07-21 DIAGNOSIS — Y92410 Unspecified street and highway as the place of occurrence of the external cause: Secondary | ICD-10-CM | POA: Insufficient documentation

## 2022-07-21 LAB — BASIC METABOLIC PANEL
Anion gap: 9 (ref 5–15)
BUN: 13 mg/dL (ref 8–23)
CO2: 22 mmol/L (ref 22–32)
Calcium: 9.3 mg/dL (ref 8.9–10.3)
Chloride: 107 mmol/L (ref 98–111)
Creatinine, Ser: 0.82 mg/dL (ref 0.44–1.00)
GFR, Estimated: >60 mL/min (ref >60)
Glucose, Bld: 94 mg/dL (ref 70–99)
Potassium: 4.2 mmol/L (ref 3.5–5.1)
Sodium: 138 mmol/L (ref 135–145)
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
## 2022-07-21 NOTE — Discharge Instructions (Addendum)
Follow-up with your PCP if your symptoms have not started to improve within the next week.   You may use up to 800mg  ibuprofen every 6 hours as needed for pain. You may also use heating packs on your back to help with pain and stiffness.  Return to the ER if you develop severe headache, vomiting, changes in vision, shortness of breath, chest pain, severe abdominal pain, dizziness.

## 2022-07-22 ENCOUNTER — Encounter: Payer: 59 | Admitting: Physician Assistant

## 2022-07-25 ENCOUNTER — Ambulatory Visit: Payer: 59 | Admitting: Physician Assistant

## 2022-07-25 DIAGNOSIS — F419 Anxiety disorder, unspecified: Secondary | ICD-10-CM | POA: Diagnosis not present

## 2022-07-25 DIAGNOSIS — M542 Cervicalgia: Secondary | ICD-10-CM | POA: Diagnosis not present

## 2022-07-25 DIAGNOSIS — F40240 Claustrophobia: Secondary | ICD-10-CM

## 2022-07-25 MED ORDER — LORAZEPAM 0.5 MG PO TABS
ORAL_TABLET | ORAL | 0 refills | Status: DC
Start: 2022-07-25 — End: 2022-08-11
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
## 2022-07-27 ENCOUNTER — Encounter: Payer: Self-pay | Admitting: Physician Assistant

## 2022-07-27 DIAGNOSIS — F40240 Claustrophobia: Secondary | ICD-10-CM | POA: Insufficient documentation

## 2022-07-28 ENCOUNTER — Other Ambulatory Visit: Payer: Self-pay

## 2022-07-28 ENCOUNTER — Other Ambulatory Visit: Payer: Self-pay | Admitting: Physician Assistant

## 2022-07-28 DIAGNOSIS — M545 Low back pain, unspecified: Secondary | ICD-10-CM

## 2022-07-28 DIAGNOSIS — M542 Cervicalgia: Secondary | ICD-10-CM

## 2022-07-28 NOTE — Telephone Encounter (Signed)
Please see pt message and advise 

## 2022-07-29 ENCOUNTER — Encounter: Payer: 59 | Admitting: Physician Assistant

## 2022-07-29 ENCOUNTER — Telehealth: Payer: Self-pay | Admitting: Physician Assistant

## 2022-07-29 NOTE — Telephone Encounter (Signed)
Caller is [place], Information systems manager associate w/ [employer]. States she received med accomodation form for patient but when asked on form how many days accomodation is needed 3  and 4 days was circled but only once choice can be circled. Caller requests this be done by EOD on Monday if possible so she can continue process. She can be reached @ [phone].

## 2022-08-01 ENCOUNTER — Other Ambulatory Visit: Payer: Self-pay | Admitting: Physician Assistant

## 2022-08-02 ENCOUNTER — Ambulatory Visit (INDEPENDENT_AMBULATORY_CARE_PROVIDER_SITE_OTHER)
Admission: RE | Admit: 2022-08-02 | Discharge: 2022-08-02 | Disposition: A | Discharge status: Home / Self Care | Payer: 59 | Source: Ambulatory Visit | Attending: Physician Assistant | Admitting: Physician Assistant

## 2022-08-02 DIAGNOSIS — M545 Low back pain, unspecified: Secondary | ICD-10-CM | POA: Diagnosis not present

## 2022-08-02 DIAGNOSIS — M542 Cervicalgia: Secondary | ICD-10-CM | POA: Diagnosis not present

## 2022-08-03 ENCOUNTER — Encounter (HOSPITAL_BASED_OUTPATIENT_CLINIC_OR_DEPARTMENT_OTHER): Payer: Self-pay | Admitting: Pulmonary Disease

## 2022-08-03 ENCOUNTER — Ambulatory Visit (HOSPITAL_BASED_OUTPATIENT_CLINIC_OR_DEPARTMENT_OTHER): Payer: 59 | Admitting: Pulmonary Disease

## 2022-08-03 VITALS — BP 118/74 | HR 87 | Temp 98.0°F | Ht 64.0 in | Wt 182.8 lb

## 2022-08-03 DIAGNOSIS — J984 Other disorders of lung: Secondary | ICD-10-CM | POA: Diagnosis not present

## 2022-08-03 DIAGNOSIS — J42 Unspecified chronic bronchitis: Secondary | ICD-10-CM | POA: Diagnosis not present

## 2022-08-03 MED ORDER — ALBUTEROL SULFATE HFA 108 (90 BASE) MCG/ACT IN AERS: 2.0000 | INHALATION_SPRAY | Freq: Four times a day (QID) | RESPIRATORY_TRACT | 2 refills | Status: AC | PRN

## 2022-08-03 NOTE — Patient Instructions (Addendum)
Mild restrictive defect Normal gas exchange. Will need further imaging for further work-up --Patient scheduled for CT Chest Abdomen Pelvis on 08/12/22 at [facility]  >Please obtain copy of disc so I can review --Once we review results, will send note to your Rheumatologist  Chronic bronchitis ?bronchiectasis --START albuterol TWO puffs in the morning and evening   >Reduce to as needed once mucous production improves

## 2022-08-03 NOTE — Progress Notes (Signed)
Subjective:   PATIENT ID: Myer Haff GENDER: female DOB: 1955-09-21, MRN: [id number]  Chief Complaint  Patient presents with   Consult    Pt does have mild bronchitis, sob, occ chest discomfort, fatigue. Ongoing for roughly 2 years     Reason for Visit: New consult for bronchitis, new patient to me  Ms. [name] is a 67 year old female never smoker with Sjogren's syndrome, hiatal hernia, hypertension, OA, rheumatoid arthritis who presents for evaluation for ILD.  She was previously seen with Dr. Vassie Loll in 2013. Re-established to Pulmonary clinic for sleep evaluation however declined sleep study. She was referred to general Pulmonary clinic by her rheumatologist on 06/22/22 for abnormal PFT at [facility] on 05/11/22 for pre-op for hiatal hernia repair. "The FVC is reduced, but the FEV1-FVC ratio is increased. The lung volumes are reduced. Following administration of bronchodilators, there is no significant response. The diffusing capacity is normal. However, the diffusing capacity was not corrected for the patients hemoglobin. Conclusions The reduced volumes indicate a restrictive process. Pulmonary Function Diagnosis Moderately severe Restriction". Concerned for Sjogrens related ILD.  She reports after the PFT test she felt improvement after taking albuterol and produced a lot of mucous. She developed shortness of breath last year and had normal cath 08/12/21. Since then she continues to have shortness of breath, productive cough. Denies wheezing. Feels like she has to catch her breath multiple things.  Social History: Never smoker  I have personally reviewed patient's past medical/family/social history, allergies, current medications.  Past Medical History:  Diagnosis Date   Anemia    history   Anxiety    Aortic atherosclerosis (HCC)    Arthritis    mild RA, secondary to Sjogren's    CAD (coronary artery disease)    Complication of anesthesia    Depression    history of,  treated with med. & therapy   Fibromyalgia    GERD (gastroesophageal reflux disease)    Heart murmur 2010   told by Dr. Beverely Low - seen perhaps on ECHO?   Hiatal hernia    History of kidney stones    passed spontaneously   Hyperlipidemia    Hypertension    Iron deficiency anemia 02/18/2022   PONV (postoperative nausea and vomiting)    mild nausea post op   Rheumatoid arthritis(714.0)    Sjogren's syndrome (HCC)      Family History  Problem Relation Age of Onset   Coronary artery disease Mother    Hypertension Mother    Diabetes Mother    Heart attack Father    Hypertension Father    CVA Father    Stroke Maternal Grandmother    Heart attack Paternal Grandmother    Breast cancer Maternal Aunt      Social History   Occupational History    Comment: [employer] of [employer], production: [employer]  Tobacco Use   Smoking status: Never    Passive exposure: Never   Smokeless tobacco: Never  Vaping Use   Vaping status: Never Used  Substance and Sexual Activity   Alcohol use: No   Drug use: Never   Sexual activity: Not Currently    Allergies  Allergen Reactions   Amitriptyline Swelling    Leg swelling    Latex Other (See Comments)    Burns skin   Amlodipine     Muscle cramps, felt dehydrated (at 2.5mg  dose)   Lipitor [Atorvastatin] Itching     Outpatient Medications Prior to  Visit  Medication Sig Dispense Refill   aspirin EC 81 MG tablet Take 1 tablet (81 mg total) by mouth daily. Swallow whole. 90 tablet 3   esomeprazole (NEXIUM) 40 MG capsule Take 40 mg by mouth daily as needed.     ibuprofen (ADVIL) 800 MG tablet Take 800 mg by mouth every 8 (eight) hours as needed.     lisinopril-hydrochlorothiazide (ZESTORETIC) 20-25 MG tablet Take 1 tablet by mouth daily. 90 tablet 3   LORazepam (ATIVAN) 0.5 MG tablet Take one tab po qhs prn muscle spasm or severe anxiety. Do not drive with this medicine. 10 tablet 0   benzonatate (TESSALON) 200 MG capsule Take 1 capsule (200  mg total) by mouth 3 (three) times daily as needed for cough. 20 capsule 0   Facility-Administered Medications Prior to Visit  Medication Dose Route Frequency Provider Last Rate Last Admin   sodium chloride flush (NS) 0.9 % injection 3 mL  3 mL Intravenous Q12H Dunn, Dayna N, PA-C        Review of Systems  Constitutional:  Negative for chills, diaphoresis, fever, malaise/fatigue and weight loss.  HENT:  Negative for congestion.   Respiratory:  Positive for cough and sputum production. Negative for hemoptysis, shortness of breath and wheezing.   Cardiovascular:  Negative for chest pain, palpitations and leg swelling.     Objective:   Vitals:   08/03/22 0851  BP: 118/74  Pulse: 87  Temp: 98 F (36.7 C)  TempSrc: Oral  SpO2: 100%  Weight: 182 lb 12.8 oz (82.9 kg)  Height: 5\' 4"  (1.626 m)   SpO2: 100 %  Physical Exam: General: Well-appearing, no acute distress HENT: [place], AT Eyes: EOMI, no scleral icterus Respiratory: Clear to auscultation bilaterally.  No crackles, wheezing or rales Cardiovascular: RRR, -M/R/G, no JVD Extremities:-Edema,-tenderness Neuro: AAO x4, CNII-XII grossly intact Psych: Normal mood, normal affect  Data Reviewed:  Imaging: CT Chest 10/27/10 - Impression: Slight decrease in the left lower lobe ground-glass attenuation  with stable appearance of the ground-glass opacity in the medial  right upper lobe.  CT A/P 07/21/22 - Moderate hiatal hernia. Visualized lung bases with no GGO, septal thickening, subpleural reticulation, traction bronchiectasis or honeycombing present.  PFT: 05/11/22 [facility] Pre-FVC 2.03 (78.5%) Post-FVC 2.07 (80.3%) Pre-FEV1 1.70 (83.6%) Post-FEV1 1.73 (85%) Ratio 84  TLC 65.3% DLCO 84.4% Interpretation: Mild restrictive defect with normal DLCO  Labs: CBC    Component Value Date/Time   WBC 6.7 03/09/2022 0900   RBC 4.17 03/09/2022 0900   HGB 10.7 (L) 03/09/2022 0900   HGB 11.3 (L) 02/18/2022 0922   HGB 11.0 (L) 08/09/2021  1548   HGB 11.6 05/13/2011 0849   HCT 35.0 (L) 03/09/2022 0900   HCT 33.7 (L) 08/09/2021 1548   HCT 35.5 05/13/2011 0849   PLT 268 03/09/2022 0900   PLT 256 02/18/2022 0922   PLT 251 08/09/2021 1548   MCV 83.9 03/09/2022 0900   MCV 80 08/09/2021 1548   MCV 86 05/13/2011 0849   MCH 25.7 (L) 03/09/2022 0900   MCHC 30.6 03/09/2022 0900   RDW 14.9 03/09/2022 0900   RDW 13.6 08/09/2021 1548   RDW 12.8 05/13/2011 0849   LYMPHSABS 1.2 03/09/2022 0900   LYMPHSABS 1.2 05/13/2011 0849   MONOABS 0.9 03/09/2022 0900   EOSABS 0.0 03/09/2022 0900   EOSABS 0.1 05/13/2011 0849   BASOSABS 0.0 03/09/2022 0900   BASOSABS 0.0 05/13/2011 0849        Assessment & Plan:   Discussion:  67 year old female never smoker with Sjogren's syndrome, hiatal hernia, hypertension, OA, rheumatoid arthritis who presents for evaluation for ILD. No evidence of ILD on recent lung views on CT A/P 07/21/22. Prior PFTs demonstrate mild restrictive defect. Fortunately DLCO normal and ambulatory O2 in-office with no desaturations. Will need to review dedicated chest imaging that is scheduled at [facility]. Low suspicion for ILD at this time. Mild restriction with normal DLCO could be related to effort. Her chronic bronchitis symptoms could also affect it but no significant obstruction noted on PFTs. Will treat with SABA and consider repeat PFTs in the future.  Mild restrictive defect Normal gas exchange. Will need further imaging for further work-up --Patient scheduled for CT Chest Abdomen Pelvis on 08/12/22 at [facility]  >Please obtain copy of disc so I can review --Once we review results, will send note to your Rheumatologist  Chronic bronchitis ?bronchiectasis --START albuterol TWO puffs in the morning and evening   >Reduce to as needed once mucous production improves  Health Maintenance Immunization History  Administered Date(s) Administered   Influenza Split 09/18/2010   Influenza,inj,Quad PF,6+ Mos 09/28/2021    PFIZER(Purple Top)SARS-COV-2 Vaccination 04/08/2019, 04/29/2019, 01/08/2020   CT Lung Screen - never smoker. Not qualified  No orders of the defined types were placed in this encounter.  Meds ordered this encounter  Medications   albuterol (VENTOLIN HFA) 108 (90 Base) MCG/ACT inhaler    Sig: Inhale 2 puffs into the lungs every 6 (six) hours as needed for wheezing or shortness of breath.    Dispense:  8 g    Refill:  2    Return for in person or video to discuss results in August. Ok to book in blocked spots..  I have spent a total time of 45-minutes on the day of the appointment reviewing prior documentation, coordinating care and discussing medical diagnosis and plan with the patient/family. Imaging, labs and tests included in this note have been reviewed and interpreted independently by me.  Haruye Lainez Mechele Collin, MD [facility] 08/03/2022 9:30 AM  Office Number [phone]

## 2022-08-11 ENCOUNTER — Encounter: Payer: Self-pay | Admitting: Physician Assistant

## 2022-08-11 ENCOUNTER — Ambulatory Visit (INDEPENDENT_AMBULATORY_CARE_PROVIDER_SITE_OTHER): Payer: 59 | Admitting: Physician Assistant

## 2022-08-11 VITALS — BP 118/76 | HR 67 | Temp 97.3°F | Ht 64.0 in | Wt 182.2 lb

## 2022-08-11 DIAGNOSIS — Z0000 Encounter for general adult medical examination without abnormal findings: Secondary | ICD-10-CM | POA: Diagnosis not present

## 2022-08-11 DIAGNOSIS — Z780 Asymptomatic menopausal state: Secondary | ICD-10-CM | POA: Diagnosis not present

## 2022-08-11 DIAGNOSIS — Z23 Encounter for immunization: Secondary | ICD-10-CM | POA: Diagnosis not present
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
## 2022-08-25 ENCOUNTER — Other Ambulatory Visit: Payer: Self-pay | Admitting: *Deleted

## 2022-08-25 DIAGNOSIS — N281 Cyst of kidney, acquired: Secondary | ICD-10-CM

## 2022-08-30 ENCOUNTER — Other Ambulatory Visit: Payer: Self-pay | Admitting: Physician Assistant

## 2022-08-31 NOTE — Telephone Encounter (Signed)
Please see patient response and advise if ok to place referral to Dr Lucia Gaskins per pt request

## 2022-09-01 ENCOUNTER — Other Ambulatory Visit: Payer: Self-pay

## 2022-09-01 DIAGNOSIS — M545 Low back pain, unspecified: Secondary | ICD-10-CM

## 2022-09-01 DIAGNOSIS — M542 Cervicalgia: Secondary | ICD-10-CM

## 2022-09-12 ENCOUNTER — Encounter: Payer: Self-pay | Admitting: Hematology & Oncology

## 2022-09-12 ENCOUNTER — Inpatient Hospital Stay: Payer: 59 | Admitting: Hematology & Oncology

## 2022-09-12 ENCOUNTER — Inpatient Hospital Stay: Payer: 59 | Attending: Hematology & Oncology

## 2022-09-12 VITALS — BP 138/74 | HR 81 | Temp 98.7°F | Resp 20 | Ht 64.0 in | Wt 185.8 lb

## 2022-09-12 DIAGNOSIS — D472 Monoclonal gammopathy: Secondary | ICD-10-CM

## 2022-09-12 DIAGNOSIS — D509 Iron deficiency anemia, unspecified: Secondary | ICD-10-CM | POA: Diagnosis present

## 2022-09-12 DIAGNOSIS — D500 Iron deficiency anemia secondary to blood loss (chronic): Secondary | ICD-10-CM

## 2022-09-12 DIAGNOSIS — M3500 Sicca syndrome, unspecified: Secondary | ICD-10-CM | POA: Diagnosis not present

## 2022-09-12 DIAGNOSIS — Z79899 Other long term (current) drug therapy: Secondary | ICD-10-CM | POA: Insufficient documentation

## 2022-09-12 DIAGNOSIS — Z7982 Long term (current) use of aspirin: Secondary | ICD-10-CM | POA: Insufficient documentation

## 2022-09-12 DIAGNOSIS — M069 Rheumatoid arthritis, unspecified: Secondary | ICD-10-CM | POA: Diagnosis not present

## 2022-09-12 LAB — CMP (CANCER CENTER ONLY)
ALT: 19 U/L (ref 0–44)
AST: 26 U/L (ref 15–41)
Albumin: 3.8 g/dL (ref 3.5–5.0)
Alkaline Phosphatase: 77 U/L (ref 38–126)
Anion gap: 7 (ref 5–15)
BUN: 9 mg/dL (ref 8–23)
CO2: 25 mmol/L (ref 22–32)
Calcium: 9.4 mg/dL (ref 8.9–10.3)
Chloride: 105 mmol/L (ref 98–111)
Creatinine: 0.77 mg/dL (ref 0.44–1.00)
GFR, Estimated: >60 mL/min (ref >60)
Glucose, Bld: 98 mg/dL (ref 70–99)
Potassium: 3.4 mmol/L — ABNORMAL LOW (ref 3.5–5.1)
Sodium: 137 mmol/L (ref 135–145)
Total Bilirubin: 0.3 mg/dL (ref 0.3–1.2)
Total Protein: 8.7 g/dL — ABNORMAL HIGH (ref 6.5–8.1)

## 2022-09-12 LAB — CBC WITH DIFFERENTIAL (CANCER CENTER ONLY)
Abs Immature Granulocytes: 0.03 10*3/uL (ref 0.00–0.07)
Basophils Absolute: 0 10*3/uL (ref 0.0–0.1)
Basophils Relative: 0 %
Eosinophils Absolute: 0.1 10*3/uL (ref 0.0–0.5)
Eosinophils Relative: 1 %
HCT: 34.9 % — ABNORMAL LOW (ref 36.0–46.0)
Hemoglobin: 10.9 g/dL — ABNORMAL LOW (ref 12.0–15.0)
Immature Granulocytes: 1 %
Lymphocytes Relative: 21 %
Lymphs Abs: 1.2 10*3/uL (ref 0.7–4.0)
MCH: 26.8 pg (ref 26.0–34.0)
MCHC: 31.2 g/dL (ref 30.0–36.0)
MCV: 85.7 fL (ref 80.0–100.0)
Monocytes Absolute: 0.6 10*3/uL (ref 0.1–1.0)
Monocytes Relative: 11 %
Neutro Abs: 3.8 10*3/uL (ref 1.7–7.7)
Neutrophils Relative %: 66 %
Platelet Count: 227 10*3/uL (ref 150–400)
RBC: 4.07 MIL/uL (ref 3.87–5.11)
RDW: 14.1 % (ref 11.5–15.5)
WBC Count: 5.7 10*3/uL (ref 4.0–10.5)
nRBC: 0 % (ref 0.0–0.2)

## 2022-09-12 LAB — IRON AND IRON BINDING CAPACITY (CC-WL,HP ONLY)
Iron: 54 ug/dL (ref 28–170)
Saturation Ratios: 14 % (ref 10.4–31.8)
TIBC: 389 ug/dL (ref 250–450)
UIBC: 335 ug/dL (ref 148–442)

## 2022-09-12 LAB — FERRITIN: Ferritin: 42 ng/mL (ref 11–307)

## 2022-09-12 LAB — LACTATE DEHYDROGENASE: LDH: 132 U/L (ref 98–192)
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
## 2022-09-13 ENCOUNTER — Ambulatory Visit (HOSPITAL_BASED_OUTPATIENT_CLINIC_OR_DEPARTMENT_OTHER): Payer: 59 | Admitting: Pulmonary Disease

## 2022-09-13 LAB — IGG, IGA, IGM
IgA: 345 mg/dL (ref 87–352)
IgG (Immunoglobin G), Serum: 3278 mg/dL — ABNORMAL HIGH (ref 586–1602)
IgM (Immunoglobulin M), Srm: 63 mg/dL (ref 26–217)

## 2022-09-13 LAB — BETA 2 MICROGLOBULIN, SERUM: Beta-2 Microglobulin: 2 mg/L (ref 0.6–2.4)

## 2022-09-13 LAB — ERYTHROPOIETIN: Erythropoietin: 22.9 m[IU]/mL — ABNORMAL HIGH (ref 2.6–18.5)

## 2022-09-14 ENCOUNTER — Other Ambulatory Visit: Payer: Self-pay | Admitting: Hematology & Oncology

## 2022-09-14 ENCOUNTER — Telehealth: Payer: Self-pay | Admitting: *Deleted

## 2022-09-14 LAB — KAPPA/LAMBDA LIGHT CHAINS
Kappa free light chain: 51 mg/L — ABNORMAL HIGH (ref 3.3–19.4)
Kappa, lambda light chain ratio: 1.76 — ABNORMAL HIGH (ref 0.26–1.65)
Lambda free light chains: 29 mg/L — ABNORMAL HIGH (ref 5.7–26.3)

## 2022-09-14 LAB — PROTEIN ELECTROPHORESIS, SERUM, WITH REFLEX
A/G Ratio: 0.6 — ABNORMAL LOW (ref 0.7–1.7)
Albumin ELP: 3.3 g/dL (ref 2.9–4.4)
Alpha-1-Globulin: 0.2 g/dL (ref 0.0–0.4)
Alpha-2-Globulin: 0.8 g/dL (ref 0.4–1.0)
Beta Globulin: 1.2 g/dL (ref 0.7–1.3)
Gamma Globulin: 2.8 g/dL — ABNORMAL HIGH (ref 0.4–1.8)
Globulin, Total: 5.1 g/dL — ABNORMAL HIGH (ref 2.2–3.9)
Total Protein ELP: 8.4 g/dL (ref 6.0–8.5)

## 2022-09-14 NOTE — Telephone Encounter (Signed)
Per staff message Ronne Binning , called patient and lvm for a callback to schedule (2) doses of IV Iron - Injectafer

## 2022-09-22 ENCOUNTER — Encounter: Payer: Self-pay | Admitting: *Deleted

## 2022-09-22 ENCOUNTER — Inpatient Hospital Stay: Payer: 59 | Attending: Hematology & Oncology

## 2022-09-22 VITALS — BP 112/66 | HR 76 | Temp 98.6°F

## 2022-09-22 DIAGNOSIS — M3500 Sicca syndrome, unspecified: Secondary | ICD-10-CM | POA: Insufficient documentation

## 2022-09-22 DIAGNOSIS — D472 Monoclonal gammopathy: Secondary | ICD-10-CM | POA: Diagnosis present

## 2022-09-22 DIAGNOSIS — D509 Iron deficiency anemia, unspecified: Secondary | ICD-10-CM | POA: Diagnosis present

## 2022-09-22 DIAGNOSIS — D508 Other iron deficiency anemias: Secondary | ICD-10-CM

## 2022-09-22 DIAGNOSIS — Z79899 Other long term (current) drug therapy: Secondary | ICD-10-CM | POA: Insufficient documentation

## 2022-09-22 DIAGNOSIS — M069 Rheumatoid arthritis, unspecified: Secondary | ICD-10-CM | POA: Insufficient documentation

## 2022-09-22 DIAGNOSIS — Z7982 Long term (current) use of aspirin: Secondary | ICD-10-CM | POA: Insufficient documentation

## 2022-09-22 MED ORDER — SODIUM CHLORIDE 0.9 % IV SOLN
300.0000 mg | Freq: Once | INTRAVENOUS | Status: AC
  Administered 2022-09-22: 300 mg via INTRAVENOUS
  Filled 2022-09-22: qty 300

## 2022-09-22 MED ORDER — SODIUM CHLORIDE 0.9 % IV SOLN: Freq: Once | INTRAVENOUS | Status: AC

## 2022-09-22 NOTE — Patient Instructions (Signed)
 Iron Sucrose Injection What is this medication? IRON SUCROSE (EYE ern SOO krose) treats low levels of iron (iron deficiency anemia) in people with kidney disease. Iron is a mineral that plays an important role in making red blood cells, which carry oxygen from your lungs to the rest of your body. This medicine may be used for other purposes; ask your health care provider or pharmacist if you have questions. COMMON BRAND NAME(S): Venofer What should I tell my care team before I take this medication? They need to know if you have any of these conditions: Anemia not caused by low iron levels Heart disease High levels of iron in the blood Kidney disease Liver disease An unusual or allergic reaction to iron, other medications, foods, dyes, or preservatives Pregnant or trying to get pregnant Breastfeeding How should I use this medication? This medication is for infusion into a vein. It is given in a hospital or clinic setting. Talk to your care team about the use of this medication in children. While this medication may be prescribed for children as young as 2 years for selected conditions, precautions do apply. Overdosage: If you think you have taken too much of this medicine contact a poison control center or emergency room at once. NOTE: This medicine is only for you. Do not share this medicine with others. What if I miss a dose? Keep appointments for follow-up doses. It is important not to miss your dose. Call your care team if you are unable to keep an appointment. What may interact with this medication? Do not take this medication with any of the following: Deferoxamine Dimercaprol Other iron products This medication may also interact with the following: Chloramphenicol Deferasirox This list may not describe all possible interactions. Give your health care provider a list of all the medicines, herbs, non-prescription drugs, or dietary supplements you use. Also tell them if you smoke,  drink alcohol, or use illegal drugs. Some items may interact with your medicine. What should I watch for while using this medication? Visit your care team regularly. Tell your care team if your symptoms do not start to get better or if they get worse. You may need blood work done while you are taking this medication. You may need to follow a special diet. Talk to your care team. Foods that contain iron include: whole grains/cereals, dried fruits, beans, or peas, leafy green vegetables, and organ meats (liver, kidney). What side effects may I notice from receiving this medication? Side effects that you should report to your care team as soon as possible: Allergic reactions--skin rash, itching, hives, swelling of the face, lips, tongue, or throat Low blood pressure--dizziness, feeling faint or lightheaded, blurry vision Shortness of breath Side effects that usually do not require medical attention (report to your care team if they continue or are bothersome): Flushing Headache Joint pain Muscle pain Nausea Pain, redness, or irritation at injection site This list may not describe all possible side effects. Call your doctor for medical advice about side effects. You may report side effects to FDA at 1-800-FDA-1088. Where should I keep my medication? This medication is given in a hospital or clinic. It will not be stored at home. NOTE: This sheet is a summary. It may not cover all possible information. If you have questions about this medicine, talk to your doctor, pharmacist, or health care provider.  2024 Elsevier/Gold Standard (2022-06-10 00:00:00)

## 2022-10-09 ENCOUNTER — Encounter: Payer: Self-pay | Admitting: Physician Assistant

## 2022-10-10 ENCOUNTER — Other Ambulatory Visit: Payer: Self-pay

## 2022-10-10 MED ORDER — LISINOPRIL-HYDROCHLOROTHIAZIDE 20-25 MG PO TABS: 1.0000 | ORAL_TABLET | Freq: Every day | ORAL | 3 refills | Status: DC

## 2022-10-26 ENCOUNTER — Ambulatory Visit (HOSPITAL_BASED_OUTPATIENT_CLINIC_OR_DEPARTMENT_OTHER): Payer: 59 | Admitting: Pulmonary Disease

## 2022-10-26 ENCOUNTER — Encounter (HOSPITAL_BASED_OUTPATIENT_CLINIC_OR_DEPARTMENT_OTHER): Payer: Self-pay | Admitting: Pulmonary Disease

## 2022-10-26 VITALS — BP 106/70 | HR 87 | Resp 18 | Ht 64.0 in | Wt 187.0 lb

## 2022-10-26 DIAGNOSIS — J42 Unspecified chronic bronchitis: Secondary | ICD-10-CM | POA: Diagnosis not present

## 2022-10-26 NOTE — Progress Notes (Unsigned)
Subjective:   PATIENT ID: [name] GENDER: female DOB: 1955/05/04, MRN: [id number]  Chief Complaint  Patient presents with   Follow-up    CT results,     Reason for Visit: Follow-up  Ms. [name] is a 67 year old female never smoker with Sjogren's syndrome, hiatal hernia, hypertension, OA, rheumatoid arthritis who presents for follow-up.  She was previously seen with Dr. Vassie Loll in 2013. Re-established to Pulmonary clinic for sleep evaluation however declined sleep study. She was referred to general Pulmonary clinic by her rheumatologist on 06/22/22 for abnormal PFT at [facility] on 05/11/22 for pre-op for hiatal hernia repair. "The FVC is reduced, but the FEV1-FVC ratio is increased. The lung volumes are reduced. Following administration of bronchodilators, there is no significant response. The diffusing capacity is normal. However, the diffusing capacity was not corrected for the patients hemoglobin. Conclusions The reduced volumes indicate a restrictive process. Pulmonary Function Diagnosis Moderately severe Restriction". Concerned for Sjogrens related ILD.  She reports after the PFT test she felt improvement after taking albuterol and produced a lot of mucous. She developed shortness of breath last year and had normal cath 08/12/21. Since then she continues to have shortness of breath, productive cough. Denies wheezing. Feels like she has to catch her breath multiple things.  10/26/22 Since our last visit she has had CT CAP on 08/22/22 has been completed. Significant for hiatal hernia for which she is planning for surgery later this winter/spring. Albuterol was helpful for mucous production.  Social History: Never smoker  Past Medical History:  Diagnosis Date   Allergy    Anemia    history   Anxiety    Aortic atherosclerosis (HCC)    Arthritis    mild RA, secondary to Sjogren's    CAD (coronary artery disease)    Complication of anesthesia    Depression    history of,  treated with med. & therapy   Fibromyalgia    GERD (gastroesophageal reflux disease)    Heart murmur 2010   told by Dr. Beverely Low - seen perhaps on ECHO?   Hiatal hernia    History of kidney stones    passed spontaneously   Hyperlipidemia    Hypertension    Iron deficiency anemia 02/18/2022   PONV (postoperative nausea and vomiting)    mild nausea post op   Rheumatoid arthritis(714.0)    Sjogren's syndrome (HCC)      Family History  Problem Relation Age of Onset   Coronary artery disease Mother    Hypertension Mother    Diabetes Mother    Heart attack Father    Hypertension Father    CVA Father    Stroke Maternal Grandmother    Heart attack Paternal Grandmother    Breast cancer Maternal Aunt      Social History   Occupational History    Comment: [employer], production: [employer]  Tobacco Use   Smoking status: Never    Passive exposure: Never   Smokeless tobacco: Never   Tobacco comments:    I never smoked anything because it didn't like the smell of it in especially on people  Vaping Use   Vaping status: Never Used  Substance and Sexual Activity   Alcohol use: No   Drug use: Never   Sexual activity: Not Currently    Birth control/protection: Abstinence    Comment: To old    Allergies  Allergen Reactions   Amitriptyline Swelling  Leg swelling    Latex Other (See Comments)    Burns skin   Amlodipine     Muscle cramps, felt dehydrated (at 2.5mg  dose)   Lipitor [Atorvastatin] Itching     Outpatient Medications Prior to Visit  Medication Sig Dispense Refill   albuterol (VENTOLIN HFA) 108 (90 Base) MCG/ACT inhaler Inhale 2 puffs into the lungs every 6 (six) hours as needed for wheezing or shortness of breath. 8 g 2   aspirin EC 81 MG tablet Take 1 tablet (81 mg total) by mouth daily. Swallow whole. 90 tablet 3   cyclobenzaprine (FLEXERIL) 5 MG tablet Take 5 mg by mouth 2 (two) times daily.     esomeprazole (NEXIUM) 40 MG capsule Take 40 mg by  mouth daily as needed.     ibuprofen (ADVIL) 800 MG tablet Take 800 mg by mouth every 8 (eight) hours as needed.     lisinopril-hydrochlorothiazide (ZESTORETIC) 20-25 MG tablet Take 1 tablet by mouth daily. 90 tablet 3   Facility-Administered Medications Prior to Visit  Medication Dose Route Frequency Provider Last Rate Last Admin   sodium chloride flush (NS) 0.9 % injection 3 mL  3 mL Intravenous Q12H Dunn, Dayna N, PA-C        Review of Systems  Constitutional:  Negative for chills, diaphoresis, fever, malaise/fatigue and weight loss.  HENT:  Negative for congestion.   Respiratory:  Positive for sputum production. Negative for cough, hemoptysis, shortness of breath and wheezing.   Cardiovascular:  Negative for chest pain, palpitations and leg swelling.     Objective:   Vitals:   10/26/22 1546  BP: 106/70  Pulse: 87  Resp: 18  SpO2: 99%  Weight: 187 lb (84.8 kg)  Height: 5\' 4"  (1.626 m)   SpO2: 99 %  Physical Exam: General: Well-appearing, no acute distress HENT: [place], AT Eyes: EOMI, no scleral icterus Respiratory: Clear to auscultation bilaterally.  No crackles, wheezing or rales Cardiovascular: RRR, -M/R/G, no JVD Extremities:-Edema,-tenderness Neuro: AAO x4, CNII-XII grossly intact Psych: Normal mood, normal affect  Data Reviewed:  Imaging: CT Chest 10/27/10 - Impression: Slight decrease in the left lower lobe ground-glass attenuation  with stable appearance of the ground-glass opacity in the medial  right upper lobe.  CT A/P 07/21/22 - Moderate hiatal hernia. Visualized lung bases with no GGO, septal thickening, subpleural reticulation, traction bronchiectasis or honeycombing present. CT CAP 08/22/22 - Visualized lung parenchyma with no pulmonary nodules, masses, infiltrate, effusion or pneumothorax.   PFT: 05/11/22 [facility] Pre-FVC 2.03 (78.5%) Post-FVC 2.07 (80.3%) Pre-FEV1 1.70 (83.6%) Post-FEV1 1.73 (85%) Ratio 84  TLC 65.3% DLCO 84.4% Interpretation: Mild  restrictive defect with normal DLCO  Labs: CBC    Component Value Date/Time   WBC 5.7 09/12/2022 1149   WBC 6.7 03/09/2022 0900   RBC 4.07 09/12/2022 1149   HGB 10.9 (L) 09/12/2022 1149   HGB 11.0 (L) 08/09/2021 1548   HGB 11.6 05/13/2011 0849   HCT 34.9 (L) 09/12/2022 1149   HCT 33.7 (L) 08/09/2021 1548   HCT 35.5 05/13/2011 0849   PLT 227 09/12/2022 1149   PLT 251 08/09/2021 1548   MCV 85.7 09/12/2022 1149   MCV 80 08/09/2021 1548   MCV 86 05/13/2011 0849   MCH 26.8 09/12/2022 1149   MCHC 31.2 09/12/2022 1149   RDW 14.1 09/12/2022 1149   RDW 13.6 08/09/2021 1548   RDW 12.8 05/13/2011 0849   LYMPHSABS 1.2 09/12/2022 1149   LYMPHSABS 1.2 05/13/2011 0849   MONOABS 0.6 09/12/2022 1149  EOSABS 0.1 09/12/2022 1149   EOSABS 0.1 05/13/2011 0849   BASOSABS 0.0 09/12/2022 1149   BASOSABS 0.0 05/13/2011 0849        Assessment & Plan:   Discussion: 67 year old female never smoker with Sjogren's syndrome, hiatal hernia, hypertension, OA, rheumatoid arthritis who presents for follow-up evaluation for ILD. No evidence of ILD on recent lung views on CT A/P 07/21/22 and CT Chest 08/22/22. Prior PFTs demonstrate mild restrictive defect. Fortunately DLCO normal and ambulatory O2 in-office with no desaturations. Low suspicion for ILD at this time. Mild restriction with normal DLCO could be related to effort and known hernia. Her chronic bronchitis symptoms improved with SABA  Mild restrictive defect may be secondary to hernia Normal gas exchange --Reviewed CT Chest 08/22/22 portion which demonstrates normal parenchymal --Follow-up in one year. Maybe consider repeat PFTs if clinically indicated  Chronic bronchitis --CONTINUE albuterol TWO puffs AS NEEDED for shortness of breath or wheezing  Health Maintenance Immunization History  Administered Date(s) Administered   Influenza Split 09/18/2010   Influenza,inj,Quad PF,6+ Mos 10/06/2020, 09/28/2021   PFIZER(Purple Top)SARS-COV-2  Vaccination 04/08/2019, 04/29/2019, 01/08/2020   PNEUMOCOCCAL CONJUGATE-20 08/11/2022   CT Lung Screen - never smoker. Not qualified  No orders of the defined types were placed in this encounter.  No orders of the defined types were placed in this encounter.   Return in about 1 year (around 10/26/2023).  I have spent a total time of 45-minutes on the day of the appointment reviewing prior documentation, coordinating care and discussing medical diagnosis and plan with the patient/family. Imaging, labs and tests included in this note have been reviewed and interpreted independently by me.  Leslie Lockner Mechele Collin, MD [facility] 10/26/2022 4:03 PM  Office Number [phone]

## 2022-10-26 NOTE — Patient Instructions (Signed)
Mild restrictive defect may be secondary to hernia Normal gas exchange --Reviewed CT Chest 08/22/22 portion which demonstrates normal parenchymal  Chronic bronchitis --CONTINUE albuterol TWO puffs AS NEEDED for shortness of breath or wheezing

## 2022-10-31 ENCOUNTER — Ambulatory Visit: Payer: 59

## 2022-10-31 ENCOUNTER — Ambulatory Visit: Payer: 59 | Admitting: Hematology & Oncology

## 2022-10-31 ENCOUNTER — Other Ambulatory Visit: Payer: 59

## 2022-11-02 ENCOUNTER — Encounter: Payer: Self-pay | Admitting: Hematology & Oncology

## 2022-11-02 ENCOUNTER — Encounter: Payer: Self-pay | Admitting: *Deleted

## 2022-11-02 ENCOUNTER — Other Ambulatory Visit: Payer: 59 | Attending: Hematology & Oncology

## 2022-11-02 ENCOUNTER — Inpatient Hospital Stay: Payer: 59

## 2022-11-02 ENCOUNTER — Inpatient Hospital Stay: Payer: 59 | Admitting: Hematology & Oncology

## 2022-11-02 VITALS — BP 125/73 | HR 79 | Temp 97.8°F | Resp 20 | Ht 64.0 in | Wt 185.0 lb

## 2022-11-02 DIAGNOSIS — Z7982 Long term (current) use of aspirin: Secondary | ICD-10-CM | POA: Diagnosis not present

## 2022-11-02 DIAGNOSIS — Z79899 Other long term (current) drug therapy: Secondary | ICD-10-CM | POA: Diagnosis not present

## 2022-11-02 DIAGNOSIS — D509 Iron deficiency anemia, unspecified: Secondary | ICD-10-CM | POA: Diagnosis present

## 2022-11-02 DIAGNOSIS — M3500 Sicca syndrome, unspecified: Secondary | ICD-10-CM | POA: Diagnosis not present

## 2022-11-02 DIAGNOSIS — M069 Rheumatoid arthritis, unspecified: Secondary | ICD-10-CM | POA: Diagnosis not present

## 2022-11-02 DIAGNOSIS — D472 Monoclonal gammopathy: Secondary | ICD-10-CM

## 2022-11-02 DIAGNOSIS — D500 Iron deficiency anemia secondary to blood loss (chronic): Secondary | ICD-10-CM

## 2022-11-02 LAB — CMP (CANCER CENTER ONLY)
ALT: 21 U/L (ref 0–44)
AST: 26 U/L (ref 15–41)
Albumin: 3.7 g/dL (ref 3.5–5.0)
Alkaline Phosphatase: 77 U/L (ref 38–126)
Anion gap: 11 (ref 5–15)
BUN: 12 mg/dL (ref 8–23)
CO2: 23 mmol/L (ref 22–32)
Calcium: 9.6 mg/dL (ref 8.9–10.3)
Chloride: 101 mmol/L (ref 98–111)
Creatinine: 0.82 mg/dL (ref 0.44–1.00)
GFR, Estimated: >60 mL/min (ref >60)
Glucose, Bld: 87 mg/dL (ref 70–99)
Potassium: 3.9 mmol/L (ref 3.5–5.1)
Sodium: 135 mmol/L (ref 135–145)
Total Bilirubin: 0.3 mg/dL (ref 0.3–1.2)
Total Protein: 8.2 g/dL — ABNORMAL HIGH (ref 6.5–8.1)

## 2022-11-02 LAB — CBC WITH DIFFERENTIAL (CANCER CENTER ONLY)
Abs Immature Granulocytes: 0.04 10*3/uL (ref 0.00–0.07)
Basophils Absolute: 0 10*3/uL (ref 0.0–0.1)
Basophils Relative: 1 %
Eosinophils Absolute: 0.1 10*3/uL (ref 0.0–0.5)
Eosinophils Relative: 1 %
HCT: 35.2 % — ABNORMAL LOW (ref 36.0–46.0)
Hemoglobin: 11.6 g/dL — ABNORMAL LOW (ref 12.0–15.0)
Immature Granulocytes: 1 %
Lymphocytes Relative: 17 %
Lymphs Abs: 1.1 10*3/uL (ref 0.7–4.0)
MCH: 27.8 pg (ref 26.0–34.0)
MCHC: 33 g/dL (ref 30.0–36.0)
MCV: 84.2 fL (ref 80.0–100.0)
Monocytes Absolute: 0.6 10*3/uL (ref 0.1–1.0)
Monocytes Relative: 10 %
Neutro Abs: 4.5 10*3/uL (ref 1.7–7.7)
Neutrophils Relative %: 70 %
Platelet Count: 210 10*3/uL (ref 150–400)
RBC: 4.18 MIL/uL (ref 3.87–5.11)
RDW: 13.9 % (ref 11.5–15.5)
WBC Count: 6.3 10*3/uL (ref 4.0–10.5)
nRBC: 0 % (ref 0.0–0.2)

## 2022-11-02 LAB — IRON AND IRON BINDING CAPACITY (CC-WL,HP ONLY)
Iron: 82 ug/dL (ref 28–170)
Saturation Ratios: 21 % (ref 10.4–31.8)
TIBC: 392 ug/dL (ref 250–450)
UIBC: 310 ug/dL (ref 148–442)

## 2022-11-02 LAB — FERRITIN: Ferritin: 149 ng/mL (ref 11–307)

## 2022-11-02 LAB — LACTATE DEHYDROGENASE: LDH: 124 U/L (ref 98–192)
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
## 2022-11-04 LAB — KAPPA/LAMBDA LIGHT CHAINS
Kappa free light chain: 54.9 mg/L — ABNORMAL HIGH (ref 3.3–19.4)
Kappa, lambda light chain ratio: 1.77 — ABNORMAL HIGH (ref 0.26–1.65)
Lambda free light chains: 31 mg/L — ABNORMAL HIGH (ref 5.7–26.3)

## 2022-11-04 LAB — IGG, IGA, IGM
IgA: 378 mg/dL — ABNORMAL HIGH (ref 87–352)
IgG (Immunoglobin G), Serum: 2469 mg/dL — ABNORMAL HIGH (ref 586–1602)
IgM (Immunoglobulin M), Srm: 69 mg/dL (ref 26–217)

## 2022-11-06 LAB — PROTEIN ELECTROPHORESIS, SERUM, WITH REFLEX
A/G Ratio: 0.7 (ref 0.7–1.7)
Albumin ELP: 3.3 g/dL (ref 2.9–4.4)
Alpha-1-Globulin: 0.3 g/dL (ref 0.0–0.4)
Alpha-2-Globulin: 0.9 g/dL (ref 0.4–1.0)
Beta Globulin: 1.3 g/dL (ref 0.7–1.3)
Gamma Globulin: 2.4 g/dL — ABNORMAL HIGH (ref 0.4–1.8)
Globulin, Total: 4.9 g/dL — ABNORMAL HIGH (ref 2.2–3.9)
Total Protein ELP: 8.2 g/dL (ref 6.0–8.5)

## 2022-12-07 ENCOUNTER — Telehealth: Payer: Self-pay | Admitting: Physician Assistant

## 2022-12-07 NOTE — Telephone Encounter (Signed)
Document  Medical Accommodation  , to be filled out by provider. Patient requested to send it back via Fax within 5-days. Document is located in providers tray at front office.Please advise

## 2022-12-08 NOTE — Telephone Encounter (Signed)
Placed in provider box for review and to be completed.

## 2022-12-09 DIAGNOSIS — Z0279 Encounter for issue of other medical certificate: Secondary | ICD-10-CM

## 2022-12-09 NOTE — Telephone Encounter (Signed)
FMLA forms completed today and faxed per pt request. Original taken to front office, copy sent to scan into patient chart. Copy save in my desk with completed forms. Patient was advised of completion.

## 2022-12-11 NOTE — Telephone Encounter (Signed)
Noted and agreed, thank you. 

## 2023-01-12 ENCOUNTER — Encounter: Payer: Self-pay | Admitting: Physician Assistant

## 2023-01-12 ENCOUNTER — Ambulatory Visit: Payer: 59 | Admitting: Physician Assistant

## 2023-01-12 VITALS — BP 117/71 | HR 73 | Temp 97.5°F | Ht 64.0 in | Wt 188.6 lb

## 2023-01-12 DIAGNOSIS — D509 Iron deficiency anemia, unspecified: Secondary | ICD-10-CM | POA: Diagnosis not present

## 2023-01-12 DIAGNOSIS — R5383 Other fatigue: Secondary | ICD-10-CM

## 2023-01-12 DIAGNOSIS — G479 Sleep disorder, unspecified: Secondary | ICD-10-CM

## 2023-01-12 DIAGNOSIS — M545 Low back pain, unspecified: Secondary | ICD-10-CM | POA: Diagnosis not present

## 2023-01-12 DIAGNOSIS — K449 Diaphragmatic hernia without obstruction or gangrene: Secondary | ICD-10-CM

## 2023-01-12 LAB — COMPREHENSIVE METABOLIC PANEL
ALT: 18 U/L (ref 0–35)
AST: 22 U/L (ref 0–37)
Albumin: 3.9 g/dL (ref 3.5–5.2)
Alkaline Phosphatase: 75 U/L (ref 39–117)
BUN: 10 mg/dL (ref 6–23)
CO2: 27 meq/L (ref 19–32)
Calcium: 9.5 mg/dL (ref 8.4–10.5)
Chloride: 103 meq/L (ref 96–112)
Creatinine, Ser: 0.7 mg/dL (ref 0.40–1.20)
GFR: 90.69 mL/min (ref >60.00)
Glucose, Bld: 86 mg/dL (ref 70–99)
Potassium: 4.2 meq/L (ref 3.5–5.1)
Sodium: 137 meq/L (ref 135–145)
Total Bilirubin: 0.3 mg/dL (ref 0.2–1.2)
Total Protein: 8.1 g/dL (ref 6.0–8.3)

## 2023-01-12 LAB — CBC WITH DIFFERENTIAL/PLATELET
Basophils Absolute: 0.1 10*3/uL (ref 0.0–0.1)
Basophils Relative: 0.9 % (ref 0.0–3.0)
Eosinophils Absolute: 0.1 10*3/uL (ref 0.0–0.7)
Eosinophils Relative: 1 % (ref 0.0–5.0)
HCT: 35.4 % — ABNORMAL LOW (ref 36.0–46.0)
Hemoglobin: 11.6 g/dL — ABNORMAL LOW (ref 12.0–15.0)
Lymphocytes Relative: 18.4 % (ref 12.0–46.0)
Lymphs Abs: 1 10*3/uL (ref 0.7–4.0)
MCHC: 32.8 g/dL (ref 30.0–36.0)
MCV: 86.4 fL (ref 78.0–100.0)
Monocytes Absolute: 0.9 10*3/uL (ref 0.1–1.0)
Monocytes Relative: 16.1 % — ABNORMAL HIGH (ref 3.0–12.0)
Neutro Abs: 3.5 10*3/uL (ref 1.4–7.7)
Neutrophils Relative %: 63.6 % (ref 43.0–77.0)
Platelets: 225 10*3/uL (ref 150.0–400.0)
RBC: 4.1 Mil/uL (ref 3.87–5.11)
RDW: 13.6 % (ref 11.5–15.5)
WBC: 5.5 10*3/uL (ref 4.0–10.5)

## 2023-01-12 LAB — TSH: TSH: 1.84 u[IU]/mL (ref 0.35–5.50)

## 2023-01-12 LAB — VITAMIN B12: Vitamin B-12: 270 pg/mL (ref 211–911)

## 2023-01-12 LAB — IBC + FERRITIN
Ferritin: 80.7 ng/mL (ref 10.0–291.0)
Iron: 49 ug/dL (ref 42–145)
Saturation Ratios: 13.1 % — ABNORMAL LOW (ref 20.0–50.0)
TIBC: 373.8 ug/dL (ref 250.0–450.0)
Transferrin: 267 mg/dL (ref 212.0–360.0)

## 2023-01-12 LAB — HEMOGLOBIN A1C: Hgb A1c MFr Bld: 6.4 % (ref 4.6–6.5)

## 2023-01-12 LAB — VITAMIN D 25 HYDROXY (VIT D DEFICIENCY, FRACTURES): VITD: 9.02 ng/mL — ABNORMAL LOW (ref 30.00–100.00)

## 2023-01-12 MED ORDER — RAMELTEON 8 MG PO TABS: 8.0000 mg | ORAL_TABLET | Freq: Every day | ORAL | 2 refills | Status: DC

## 2023-01-12 NOTE — Progress Notes (Signed)
Patient ID: [name], female    DOB: [birth year], 67 y.o.   MRN: [id number]   Assessment & Plan:  Other fatigue -     VITAMIN D 25 Hydroxy (Vit-D Deficiency, Fractures) -     Vitamin B12 -     TSH -     Comprehensive metabolic panel -     CBC with Differential/Platelet -     Hemoglobin A1c -     IBC + Ferritin -     Ramelteon; Take 1 tablet (8 mg total) by mouth at bedtime.  Dispense: 30 tablet; Refill: 2  Iron deficiency anemia, unspecified iron deficiency anemia type -     CBC with Differential/Platelet -     IBC + Ferritin  Difficulty sleeping -     Ramelteon; Take 1 tablet (8 mg total) by mouth at bedtime.  Dispense: 30 tablet; Refill: 2  Lumbar pain  Large hiatal hernia   Assessment and Plan    Hiatal Hernia Scheduled for surgical repair on January 21st due to symptoms of chest pain and difficulty swallowing. -Continue with current management plan and prepare for upcoming surgery.  Back Pain Recent exacerbation of pain despite gabapentin and recent back injections. Plan for further injections or possible surgery discussed with orthopedic doctor. -Continue current pain management and follow up with orthopedic doctor in February.  Iron Deficiency Anemia Recent iron transfusion, however, still experiencing fatigue. Iron levels reported as good by Dr. Mercer Pod. -Check iron levels and other basic labs to rule out other causes of fatigue.  Insomnia Chronic issue, exacerbated by current health concerns. Over-the-counter remedies ineffective. -Prescribe Ramelteon 8mg  at bedtime for sleep.  General Health Maintenance -Check B12 and thyroid levels. -Check A1C levels. -Continue with physical therapy as currently prescribed.           Return if symptoms worsen or fail to improve.    Subjective:    Chief Complaint  Patient presents with   Fatigue    Pt c/o of feeling very tired, pt feels anxious for a while now and would like to discuss, has hx of  low hgb     HPI Discussed the use of AI scribe software for clinical note transcription with the patient, who gave verbal consent to proceed.  History of Present Illness   The patient, with a history of hiatal hernia, back pain, and anemia, presents with overwhelming fatigue and sleep disturbances. She reports feeling "very overwhelmed" and "on edge," which she attributes to her upcoming hiatal hernia surgery and persistent back pain. The patient mentions that her back pain had initially improved following injections but has since returned to its previous intensity. She is scheduled to discuss potential further injections or surgery with her orthopedic doctor in February.  The patient also reports having received an iron transfusion due to anemia, but she continues to feel tired despite her doctor reporting that her iron levels were good. She mentions potential hormone level issues and experiencing night sweats, but she is hesitant to take synthetic hormones due to a family history of breast cancer.  The patient's sleep disturbances are significant, with her reporting only getting about three hours of sleep per night. She expresses a desire to get more rest and believes that her lack of sleep is contributing to her fatigue and feelings of being overwhelmed.       Past Medical History:  Diagnosis Date   Allergy    Anemia    history   Anxiety  Aortic atherosclerosis (HCC)    Arthritis    mild RA, secondary to Sjogren's    CAD (coronary artery disease)    Complication of anesthesia    Depression    history of, treated with med. & therapy   Fibromyalgia    GERD (gastroesophageal reflux disease)    Heart murmur 2010   told by Dr. Beverely Low - seen perhaps on ECHO?   Hiatal hernia    History of kidney stones    passed spontaneously   Hyperlipidemia    Hypertension    Iron deficiency anemia 02/18/2022   PONV (postoperative nausea and vomiting)    mild nausea post op   Rheumatoid  arthritis(714.0)    Sjogren's syndrome (HCC)     Past Surgical History:  Procedure Laterality Date   ABDOMINAL HYSTERECTOMY  1996   ANTERIOR CERVICAL DECOMP/DISCECTOMY FUSION N/A 11/02/2017   Procedure: ANTERIOR CERVICAL DECOMPRESSION FUSION, CERVICAL 6-7 WITH INSTRUMENTATION AND ALLOGRAFT;  Surgeon: Estill Bamberg, MD;  Location: [facility] OR;  Service: Orthopedics;  Laterality: N/A;   IR BONE MARROW BIOPSY & ASPIRATION  03/09/2022   LEFT HEART CATH AND CORONARY ANGIOGRAPHY N/A 08/12/2021   Procedure: LEFT HEART CATH AND CORONARY ANGIOGRAPHY;  Surgeon: Lyn Records, MD;  Location: [facility];  Service: Cardiovascular;  Laterality: N/A;   OOPHORECTOMY     1 ovary remain, removed adhesions on bowel   REDUCTION MAMMAPLASTY Bilateral 11/2009   TONSILLECTOMY     born without tonsils    VESICOVAGINAL FISTULA CLOSURE W/ TAH      Family History  Problem Relation Age of Onset   Coronary artery disease Mother    Hypertension Mother    Diabetes Mother    Heart attack Father    Hypertension Father    CVA Father    Stroke Maternal Grandmother    Heart attack Paternal Grandmother    Breast cancer Maternal Aunt     Social History   Tobacco Use   Smoking status: Never    Passive exposure: Never   Smokeless tobacco: Never   Tobacco comments:    I never smoked anything because it didn't like the smell of it in especially on people  Vaping Use   Vaping status: Never Used  Substance Use Topics   Alcohol use: No   Drug use: Never     Allergies  Allergen Reactions   Amitriptyline Swelling    Leg swelling    Latex Other (See Comments)    Burns skin   Amlodipine     Muscle cramps, felt dehydrated (at 2.5mg  dose)   Lipitor [Atorvastatin] Itching    Review of Systems NEGATIVE UNLESS OTHERWISE INDICATED IN HPI      Objective:     BP 117/71   Pulse 73   Temp (!) 97.5 F (36.4 C)   Ht 5\' 4"  (1.626 m)   Wt 188 lb 9.6 oz (85.5 kg)   SpO2 99%   BMI 32.37 kg/m   Wt  Readings from Last 3 Encounters:  01/12/23 188 lb 9.6 oz (85.5 kg)  11/02/22 185 lb (83.9 kg)  10/26/22 187 lb (84.8 kg)    BP Readings from Last 3 Encounters:  01/12/23 117/71  11/02/22 125/73  10/26/22 106/70     Physical Exam Vitals and nursing note reviewed.  Constitutional:      Appearance: Normal appearance. She is normal weight. She is not toxic-appearing.  HENT:     Head: Normocephalic and atraumatic.     Mouth/Throat:  Mouth: Mucous membranes are dry.     Comments: Sjogren's syndrome Eyes:     Extraocular Movements: Extraocular movements intact.     Conjunctiva/sclera: Conjunctivae normal.     Pupils: Pupils are equal, round, and reactive to light.  Cardiovascular:     Rate and Rhythm: Normal rate and regular rhythm.     Pulses: Normal pulses.     Heart sounds: Normal heart sounds.  Pulmonary:     Effort: Pulmonary effort is normal.     Breath sounds: Normal breath sounds.  Musculoskeletal:        General: Normal range of motion.     Cervical back: Normal range of motion and neck supple.  Lymphadenopathy:     Cervical: No cervical adenopathy.     Upper Body:     Right upper body: No supraclavicular adenopathy.     Left upper body: No supraclavicular adenopathy.  Skin:    General: Skin is warm and dry.  Neurological:     General: No focal deficit present.     Mental Status: She is alert and oriented to person, place, and time.  Psychiatric:        Mood and Affect: Mood normal.        Behavior: Behavior normal.           Time Spent: 33 minutes of total time was spent on the date of the encounter performing the following actions: chart review prior to seeing the patient, obtaining history, performing a medically necessary exam, counseling on the treatment plan, placing orders, and documenting in our EHR.       Clarabel Marion M Kailon Treese, PA-C

## 2023-01-13 ENCOUNTER — Other Ambulatory Visit: Payer: Self-pay | Admitting: *Deleted

## 2023-01-13 MED ORDER — VITAMIN D (ERGOCALCIFEROL) 1.25 MG (50000 UNIT) PO CAPS: 50000.0000 [IU] | ORAL_CAPSULE | ORAL | 0 refills | Status: DC

## 2023-01-17 ENCOUNTER — Telehealth: Payer: Self-pay | Admitting: Physician Assistant

## 2023-01-17 NOTE — Telephone Encounter (Signed)
 Patient faxed  Leave of absence extension form , to be filled out by provider. Patient requested to send it back via Fax within 5-days. Document is located in providers tray at front office.Please advise at Mobile [phone] (mobile)

## 2023-01-17 NOTE — Telephone Encounter (Signed)
Placed in provider box for completion and review

## 2023-01-19 DIAGNOSIS — Z0279 Encounter for issue of other medical certificate: Secondary | ICD-10-CM

## 2023-01-20 ENCOUNTER — Ambulatory Visit: Payer: 59 | Admitting: Physician Assistant

## 2023-02-14 ENCOUNTER — Ambulatory Visit: Payer: 59 | Admitting: Physician Assistant

## 2023-02-24 ENCOUNTER — Other Ambulatory Visit (HOSPITAL_BASED_OUTPATIENT_CLINIC_OR_DEPARTMENT_OTHER): Payer: Self-pay | Admitting: Pulmonary Disease

## 2023-03-06 ENCOUNTER — Ambulatory Visit: Payer: 59 | Admitting: Physician Assistant

## 2023-03-06 ENCOUNTER — Telehealth: Payer: Self-pay

## 2023-03-06 NOTE — Telephone Encounter (Signed)
Copied from CRM [id number]. Topic: Clinical - Request for Lab/Test Order >> Mar 06, 2023  9:38 AM Sim Boast F wrote: Reason for CRM: Patient returning call regarding her cancelled appointment today due to provider being out of office, says she's requesting to have her glucose levels checked since she had GI surgery 3 weeks ago and her levels were high. Also has had weight loss and believes her levels may have dropped? Requested for order to be put in  Please Advise on pt's request/concerns  Message has been sent to provider to address

## 2023-03-07 ENCOUNTER — Other Ambulatory Visit: Payer: Self-pay | Admitting: Physician Assistant

## 2023-03-07 ENCOUNTER — Other Ambulatory Visit: Payer: Self-pay

## 2023-03-07 DIAGNOSIS — Z1231 Encounter for screening mammogram for malignant neoplasm of breast: Secondary | ICD-10-CM

## 2023-03-07 DIAGNOSIS — R739 Hyperglycemia, unspecified: Secondary | ICD-10-CM

## 2023-03-07 NOTE — Telephone Encounter (Signed)
Please contact patient and schedule lab only appointment, future orders already placed.

## 2023-03-10 ENCOUNTER — Other Ambulatory Visit (INDEPENDENT_AMBULATORY_CARE_PROVIDER_SITE_OTHER): Payer: 59

## 2023-03-10 DIAGNOSIS — R739 Hyperglycemia, unspecified: Secondary | ICD-10-CM

## 2023-03-10 LAB — BASIC METABOLIC PANEL
BUN: 12 mg/dL (ref 6–23)
CO2: 26 meq/L (ref 19–32)
Calcium: 9.3 mg/dL (ref 8.4–10.5)
Chloride: 102 meq/L (ref 96–112)
Creatinine, Ser: 0.84 mg/dL (ref 0.40–1.20)
GFR: 72.79 mL/min (ref >60.00)
Glucose, Bld: 123 mg/dL — ABNORMAL HIGH (ref 70–99)
Potassium: 3.4 meq/L — ABNORMAL LOW (ref 3.5–5.1)
Sodium: 137 meq/L (ref 135–145)

## 2023-03-13 ENCOUNTER — Encounter: Payer: Self-pay | Admitting: Physician Assistant

## 2023-03-14 ENCOUNTER — Ambulatory Visit: Payer: 59

## 2023-03-31 ENCOUNTER — Encounter: Payer: Self-pay | Admitting: Physician Assistant

## 2023-03-31 ENCOUNTER — Ambulatory Visit: Payer: 59 | Admitting: Physician Assistant

## 2023-03-31 VITALS — BP 120/84 | HR 69 | Temp 98.4°F | Ht 64.0 in | Wt 176.4 lb

## 2023-03-31 DIAGNOSIS — M545 Low back pain, unspecified: Secondary | ICD-10-CM | POA: Diagnosis not present

## 2023-03-31 DIAGNOSIS — E538 Deficiency of other specified B group vitamins: Secondary | ICD-10-CM

## 2023-03-31 DIAGNOSIS — K449 Diaphragmatic hernia without obstruction or gangrene: Secondary | ICD-10-CM

## 2023-03-31 DIAGNOSIS — R768 Other specified abnormal immunological findings in serum: Secondary | ICD-10-CM | POA: Insufficient documentation

## 2023-03-31 DIAGNOSIS — E876 Hypokalemia: Secondary | ICD-10-CM

## 2023-03-31 DIAGNOSIS — E559 Vitamin D deficiency, unspecified: Secondary | ICD-10-CM

## 2023-03-31 MED ORDER — POTASSIUM CHLORIDE CRYS ER 20 MEQ PO TBCR: 20.0000 meq | EXTENDED_RELEASE_TABLET | Freq: Every day | ORAL | 1 refills | Status: DC

## 2023-03-31 NOTE — Progress Notes (Signed)
 Patient ID: [name], female    DOB: [birth year], 68 y.o.   MRN: [id number]   Assessment & Plan:  Hypokalemia -     Potassium Chloride Crys ER; Take 1 tablet (20 mEq total) by mouth daily.  Dispense: 30 tablet; Refill: 1  Vitamin D deficiency  Low serum vitamin B12    Assessment and Plan    Chronic Low Back Pain Severe chronic low back pain, recent deeper back injections near sciatic nerve causing temporary numbness. Potential surgery if pain persists. - Monitor response to recent back injections. - Consider back surgery if pain persists; she is working with her specialists.   Hypokalemia Slightly low potassium levels causing leg cramps. Dietary potassium increase and supplementation suggested. - Prescribe potassium supplementation for 30 days. - Increase dietary intake of potassium-rich foods. Lab Results  Component Value Date   K 3.4 (L) 03/10/2023     Vitamin D Deficiency Low vitamin D levels managed with supplements. - Refill vitamin D prescription as needed.  Hiatal Hernia repair - surgery 02/07/23, seems to be recovering well - will cont f/up with her surgeon  General Health Maintenance Focus on weight management and glucose monitoring. Weight loss achieved, aims for more. B12 supplementation considered for energy, insurance coverage discussed. - Continue weight management efforts. - Monitor glucose levels. - Consider B12 supplementation if energy levels do not improve. - Recheck labs in four months for B12, vitamin D, glucose, and potassium levels.       Return in about 4 months (around 07/31/2023) for recheck/follow-up, labs .    Subjective:    Chief Complaint  Patient presents with   Medical Management of Chronic Issues    Pt in office for 6 mon f/u w/ PCP; pt has been getting injections in back; pt also had surgery 1/21 with gastro and is doing well; when stress levels increases is having pain on RUQ and thinks it may be nerve related;      HPI Discussed the use of AI scribe software for clinical note transcription with the patient, who gave verbal consent to proceed.  History of Present Illness   [name] "[name]" is a 68 year old female who presents for follow-up after recent back injections.  She recently underwent a third set of back injections, which were administered deeper and closer to the sciatic nerve area compared to previous ones. Post-procedure, she experienced numbness in her whole left leg and foot, which lasted for a few hours. Currently, she is feeling better. She experienced some pain this morning but feels mostly good now. She has a history of back issues and is considering back surgery. Her husband has been supportive throughout her treatment journey.  She has experienced leg cramps, which were severe about three months ago. Her potassium levels were slightly low, and she has been using mustard packs and drinking water to manage the cramps.  Her vitamin D was previously low, and she has been taking supplements. She is considering B12 shots to improve her energy levels, although insurance coverage may be an issue if levels are not below the normal range.  She underwent surgery in January for her hiatal hernia. She is focused on maintaining her weight loss to keep her glucose levels stable. She and her husband sleep in separate bedrooms due to her sleep issues. Her mother, who is 61, lives in [place] and is described as youthful and energetic.       Past Medical History:  Diagnosis  Date   Allergy    Anemia    history   Anxiety    Aortic atherosclerosis (HCC)    Arthritis    mild RA, secondary to Sjogren's    CAD (coronary artery disease)    Complication of anesthesia    Depression    history of, treated with med. & therapy   Fibromyalgia    GERD (gastroesophageal reflux disease)    Heart murmur 2010   told by Dr. Beverely Low - seen perhaps on ECHO?   Hiatal hernia    History of kidney stones     passed spontaneously   Hyperlipidemia    Hypertension    Iron deficiency anemia 02/18/2022   PONV (postoperative nausea and vomiting)    mild nausea post op   Rheumatoid arthritis(714.0)    Sjogren's syndrome (HCC)     Past Surgical History:  Procedure Laterality Date   ABDOMINAL HYSTERECTOMY  1996   ANTERIOR CERVICAL DECOMP/DISCECTOMY FUSION N/A 11/02/2017   Procedure: ANTERIOR CERVICAL DECOMPRESSION FUSION, CERVICAL 6-7 WITH INSTRUMENTATION AND ALLOGRAFT;  Surgeon: Estill Bamberg, MD;  Location: [facility] OR;  Service: Orthopedics;  Laterality: N/A;   HERNIA REPAIR     IR BONE MARROW BIOPSY & ASPIRATION  03/09/2022   LEFT HEART CATH AND CORONARY ANGIOGRAPHY N/A 08/12/2021   Procedure: LEFT HEART CATH AND CORONARY ANGIOGRAPHY;  Surgeon: Lyn Records, MD;  Location: [facility];  Service: Cardiovascular;  Laterality: N/A;   OOPHORECTOMY     1 ovary remain, removed adhesions on bowel   REDUCTION MAMMAPLASTY Bilateral 11/2009   TONSILLECTOMY     born without tonsils    VESICOVAGINAL FISTULA CLOSURE W/ TAH      Family History  Problem Relation Age of Onset   Coronary artery disease Mother    Hypertension Mother    Diabetes Mother    Heart attack Father    Hypertension Father    CVA Father    Stroke Maternal Grandmother    Heart attack Paternal Grandmother    Breast cancer Maternal Aunt     Social History   Tobacco Use   Smoking status: Never    Passive exposure: Never   Smokeless tobacco: Never   Tobacco comments:    I never smoked anything because it didn't like the smell of it in especially on people  Vaping Use   Vaping status: Never Used  Substance Use Topics   Alcohol use: No   Drug use: Never     Allergies  Allergen Reactions   Amitriptyline Swelling    Leg swelling    Latex Other (See Comments)    Burns skin   Amlodipine     Muscle cramps, felt dehydrated (at 2.5mg  dose)   Lipitor [Atorvastatin] Itching    Review of Systems NEGATIVE UNLESS  OTHERWISE INDICATED IN HPI      Objective:     BP 120/84 (BP Location: Left Arm, Patient Position: Sitting, Cuff Size: Normal)   Pulse 69   Temp 98.4 F (36.9 C) (Temporal)   Ht 5\' 4"  (1.626 m)   Wt 176 lb 6.4 oz (80 kg)   SpO2 98%   BMI 30.28 kg/m   Wt Readings from Last 3 Encounters:  03/31/23 176 lb 6.4 oz (80 kg)  01/12/23 188 lb 9.6 oz (85.5 kg)  11/02/22 185 lb (83.9 kg)    BP Readings from Last 3 Encounters:  03/31/23 120/84  01/12/23 117/71  11/02/22 125/73     Physical Exam Vitals and nursing  note reviewed.  Constitutional:      Appearance: Normal appearance.  Eyes:     Extraocular Movements: Extraocular movements intact.     Conjunctiva/sclera: Conjunctivae normal.     Pupils: Pupils are equal, round, and reactive to light.  Cardiovascular:     Rate and Rhythm: Normal rate and regular rhythm.  Pulmonary:     Effort: Pulmonary effort is normal.     Breath sounds: Normal breath sounds.  Neurological:     General: No focal deficit present.     Mental Status: She is alert and oriented to person, place, and time.  Psychiatric:        Mood and Affect: Mood normal.        Behavior: Behavior normal.           Time Spent: 30 minutes of total time was spent on the date of the encounter performing the following actions: chart review prior to seeing the patient, obtaining history, performing a medically necessary exam, counseling on the treatment plan, placing orders, and documenting in our EHR.       Rumor Sun M Samon Dishner, PA-C

## 2023-04-03 ENCOUNTER — Encounter: Payer: Self-pay | Admitting: Physician Assistant

## 2023-04-04 ENCOUNTER — Other Ambulatory Visit: Payer: Self-pay | Admitting: Physician Assistant

## 2023-04-04 NOTE — Telephone Encounter (Signed)
Please advise on refill for patient 

## 2023-05-18 ENCOUNTER — Telehealth: Payer: Self-pay

## 2023-05-18 NOTE — Telephone Encounter (Signed)
 Unable to reach anyone with [employer] at the number listed who could help with this call. Sent pt MyChart message with needed info.

## 2023-05-18 NOTE — Telephone Encounter (Signed)
 Copied from CRM [id number]. Topic: General - Other >> May 18, 2023 11:07 AM Howard Macho wrote: Reason for CRM: patient with [employer] on the line called stating her insurance need a cpt code for the B12 shot injections that the provider want her to start receiving so her insurance can pay for it  CB [phone]  Please see pt msg in regards to CPT code for B-12 injections and advise

## 2023-06-26 ENCOUNTER — Other Ambulatory Visit: Payer: Self-pay | Admitting: Physician Assistant

## 2023-07-14 ENCOUNTER — Ambulatory Visit
Admission: RE | Admit: 2023-07-14 | Discharge: 2023-07-14 | Disposition: A | Discharge status: Home / Self Care | Source: Ambulatory Visit | Attending: Physician Assistant

## 2023-07-14 DIAGNOSIS — K224 Dyskinesia of esophagus: Secondary | ICD-10-CM | POA: Insufficient documentation

## 2023-07-14 DIAGNOSIS — Z1231 Encounter for screening mammogram for malignant neoplasm of breast: Secondary | ICD-10-CM

## 2023-07-19 ENCOUNTER — Ambulatory Visit: Payer: Self-pay | Admitting: Physician Assistant

## 2023-07-20 ENCOUNTER — Other Ambulatory Visit: Payer: Self-pay | Admitting: Physician Assistant

## 2023-07-20 DIAGNOSIS — R928 Other abnormal and inconclusive findings on diagnostic imaging of breast: Secondary | ICD-10-CM

## 2023-07-25 ENCOUNTER — Other Ambulatory Visit

## 2023-07-25 ENCOUNTER — Encounter

## 2023-07-26 ENCOUNTER — Inpatient Hospital Stay
Admission: RE | Admit: 2023-07-26 | Discharge: 2023-07-26 | Discharge status: Home / Self Care | Source: Ambulatory Visit | Attending: Physician Assistant

## 2023-07-26 ENCOUNTER — Other Ambulatory Visit

## 2023-07-26 DIAGNOSIS — R928 Other abnormal and inconclusive findings on diagnostic imaging of breast: Secondary | ICD-10-CM

## 2023-08-04 ENCOUNTER — Ambulatory Visit: Admitting: Physician Assistant

## 2023-08-09 ENCOUNTER — Ambulatory Visit
Admission: RE | Admit: 2023-08-09 | Discharge: 2023-08-09 | Disposition: A | Discharge status: Home / Self Care | Source: Ambulatory Visit | Attending: Medical

## 2023-08-09 ENCOUNTER — Encounter: Payer: Self-pay | Admitting: Medical

## 2023-08-09 ENCOUNTER — Other Ambulatory Visit: Payer: Self-pay | Admitting: Medical

## 2023-08-09 DIAGNOSIS — M25561 Pain in right knee: Secondary | ICD-10-CM

## 2023-08-09 DIAGNOSIS — M25521 Pain in right elbow: Secondary | ICD-10-CM | POA: Insufficient documentation

## 2023-08-09 DIAGNOSIS — M25511 Pain in right shoulder: Secondary | ICD-10-CM | POA: Insufficient documentation

## 2023-08-23 ENCOUNTER — Ambulatory Visit: Admitting: Physician Assistant

## 2023-08-23 ENCOUNTER — Encounter: Payer: Self-pay | Admitting: Physician Assistant

## 2023-08-23 VITALS — BP 126/68 | HR 72 | Temp 97.3°F | Ht 64.0 in | Wt 181.6 lb

## 2023-08-23 DIAGNOSIS — E876 Hypokalemia: Secondary | ICD-10-CM

## 2023-08-23 DIAGNOSIS — R2232 Localized swelling, mass and lump, left upper limb: Secondary | ICD-10-CM | POA: Diagnosis not present

## 2023-08-23 DIAGNOSIS — E538 Deficiency of other specified B group vitamins: Secondary | ICD-10-CM | POA: Diagnosis not present

## 2023-08-23 DIAGNOSIS — E7800 Pure hypercholesterolemia, unspecified: Secondary | ICD-10-CM | POA: Diagnosis not present

## 2023-08-23 DIAGNOSIS — Z780 Asymptomatic menopausal state: Secondary | ICD-10-CM

## 2023-08-23 DIAGNOSIS — E559 Vitamin D deficiency, unspecified: Secondary | ICD-10-CM

## 2023-08-23 DIAGNOSIS — D649 Anemia, unspecified: Secondary | ICD-10-CM | POA: Diagnosis not present

## 2023-08-23 DIAGNOSIS — F4329 Adjustment disorder with other symptoms: Secondary | ICD-10-CM

## 2023-08-23 DIAGNOSIS — R7303 Prediabetes: Secondary | ICD-10-CM | POA: Diagnosis not present

## 2023-08-23 DIAGNOSIS — Z1211 Encounter for screening for malignant neoplasm of colon: Secondary | ICD-10-CM

## 2023-08-23 LAB — CBC WITH DIFFERENTIAL/PLATELET
Basophils Absolute: 0 K/uL (ref 0.0–0.1)
Basophils Relative: 0.7 % (ref 0.0–3.0)
Eosinophils Absolute: 0 K/uL (ref 0.0–0.7)
Eosinophils Relative: 0.7 % (ref 0.0–5.0)
HCT: 37 % (ref 36.0–46.0)
Hemoglobin: 12 g/dL (ref 12.0–15.0)
Lymphocytes Relative: 16.2 % (ref 12.0–46.0)
Lymphs Abs: 1.1 K/uL (ref 0.7–4.0)
MCHC: 32.4 g/dL (ref 30.0–36.0)
MCV: 86.2 fl (ref 78.0–100.0)
Monocytes Absolute: 0.8 K/uL (ref 0.1–1.0)
Monocytes Relative: 11.7 % (ref 3.0–12.0)
Neutro Abs: 4.6 K/uL (ref 1.4–7.7)
Neutrophils Relative %: 70.7 % (ref 43.0–77.0)
Platelets: 242 K/uL (ref 150.0–400.0)
RBC: 4.3 Mil/uL (ref 3.87–5.11)
RDW: 13.2 % (ref 11.5–15.5)
WBC: 6.6 K/uL (ref 4.0–10.5)

## 2023-08-23 LAB — COMPREHENSIVE METABOLIC PANEL WITH GFR
ALT: 17 U/L (ref 0–35)
AST: 22 U/L (ref 0–37)
Albumin: 3.9 g/dL (ref 3.5–5.2)
Alkaline Phosphatase: 66 U/L (ref 39–117)
BUN: 15 mg/dL (ref 6–23)
CO2: 28 meq/L (ref 19–32)
Calcium: 9.8 mg/dL (ref 8.4–10.5)
Chloride: 104 meq/L (ref 96–112)
Creatinine, Ser: 0.7 mg/dL (ref 0.40–1.20)
GFR: 90.3 mL/min (ref >60.00)
Glucose, Bld: 89 mg/dL (ref 70–99)
Potassium: 4.6 meq/L (ref 3.5–5.1)
Sodium: 138 meq/L (ref 135–145)
Total Bilirubin: 0.3 mg/dL (ref 0.2–1.2)
Total Protein: 8.5 g/dL — ABNORMAL HIGH (ref 6.0–8.3)

## 2023-08-23 LAB — LIPID PANEL
Cholesterol: 202 mg/dL — ABNORMAL HIGH (ref 0–200)
HDL: 60.1 mg/dL (ref >39.00)
LDL Cholesterol: 124 mg/dL — ABNORMAL HIGH (ref 0–99)
NonHDL: 141.88
Total CHOL/HDL Ratio: 3
Triglycerides: 88 mg/dL (ref 0.0–149.0)
VLDL: 17.6 mg/dL (ref 0.0–40.0)

## 2023-08-23 LAB — HEMOGLOBIN A1C: Hgb A1c MFr Bld: 6.3 % (ref 4.6–6.5)

## 2023-08-23 LAB — VITAMIN B12: Vitamin B-12: 404 pg/mL (ref 211–911)

## 2023-08-23 LAB — VITAMIN D 25 HYDROXY (VIT D DEFICIENCY, FRACTURES): VITD: 34.81 ng/mL (ref 30.00–100.00)

## 2023-08-23 NOTE — Progress Notes (Signed)
 Patient ID: RICHANDA DARIN, female    DOB: Jan 19, 1955, 68 y.o.   MRN: [id number]   Assessment & Plan:  Mass of left upper extremity -     US  SOFT TISSUE UPPER EXTREMITY LIMITED LEFT (NON-VASCULAR); Future  Postmenopausal -     HM DEXA SCAN  Screening for colon cancer -     Ambulatory referral to Gastroenterology  Hypokalemia -     Comprehensive metabolic panel with GFR  Vitamin D  deficiency -     VITAMIN D  25 Hydroxy (Vit-D Deficiency, Fractures)  Low serum vitamin B12 -     Vitamin B12  Prediabetes -     Hemoglobin A1c  Elevated LDL cholesterol level -     Lipid panel  Low hemoglobin -     CBC with Differential/Platelet  Stress and adjustment reaction -     Ambulatory referral to Psychology     Assessment & Plan Subcutaneous masses of left upper arm and lower extremity Presence of subcutaneous masses in the left upper arm and lower extremity. The mass in the left upper arm is tender and non-mobile, located over the tricep area. Differential diagnosis includes hematoma, lipoma, or cyst. No associated bruising or redness. The mass on the leg is not currently palpable but was previously noted by her and her husband. - Order ultrasound of the soft tissue of the left upper extremity to characterize the subcutaneous mass.  Adjustment disorder with mixed anxiety and depressed mood related to retirement and health concerns Experiencing mixed anxiety and depressed mood related to upcoming retirement and health concerns. Symptoms include irritability, restlessness, and emotional lability. No indication for medication at this time. - Refer to Dr. Richerd Ling for counseling to address stress and adjustment response.  History of fall with injury and post-fall knee pain Fall on July 23 resulted in knee pain. Received a knee injection and awaiting therapy approval for arm. Reports feeling clumsy and off balance, possibly related to stress and fatigue. No new falls  reported.  Back pain, under treatment Chronic back pain under treatment with injections. Ongoing stress and fatigue may contribute to her symptoms.  Dysphagia with esophageal webbing, referred to GI Dysphagia with esophageal webbing. Referral to gastroenterology for further evaluation is in place. Reports ongoing difficulty swallowing.  Prediabetes Prediabetes with last A1c at 6.4%. Monitoring for progression to diabetes. - Recheck A1c. Lab Results  Component Value Date   HGBA1C 6.4 01/12/2023   HGBA1C 6.2 07/01/2021     Vitamin D  deficiency, under treatment Vitamin D  deficiency under treatment with oral supplements. - Recheck vitamin D  levels.      Return in about 6 months (around 02/23/2024) for recheck/follow-up.    Subjective:    Chief Complaint  Patient presents with   Medical Management of Chronic Issues    Pt in office for 4 mon follow up; pt admits to falling recently; also states since starting back injections and last surgery she is not feeling herself, feels overwhelmed, not socializing as much, and seems to be more irritated recently; pt requesting Colonoscopy;     HPI Discussed the use of AI scribe software for clinical note transcription with the patient, who gave verbal consent to proceed.  History of Present Illness Makynzie C Garnett [name] is a 68 year old female who presents with stress and adjustment issues related to upcoming retirement and multiple medical concerns.  She feels irritable and not like herself, attributing these feelings to stress from work and her upcoming retirement.  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 She is planning to retire on April 15th of the following year and is experiencing stress related to this transition. She discusses the emotional impact of aging and the recent loss of her husband's brother, which has prompted discussions about  mortality and future planning. She feels anxious and restless, with racing thoughts and occasional irritability.  She has been experiencing difficulty swallowing and reports being told she has 'webbing in her throat.'  She describes a recent fall on July 23rd, which resulted in a knee injury for which she received an injection. She also mentions a knot in her left upper arm and another on her leg, which are tender and concerning to her. She is awaiting approval for therapy on her arm. No recent bruising around the knots on her arm and leg.  She expresses concern about her health, mentioning a history of back surgery and ongoing back injections. She also notes a recent breast examination that required follow-up but was ultimately not concerning.  She is taking vitamin D  and B12 supplements, with B12 being over-the-counter. She mentions a lapse in receiving B12 injections due to other health concerns taking precedence.     Past Medical History:  Diagnosis Date   Allergy    Anemia    history   Anxiety    Aortic atherosclerosis (HCC)    Arthritis    mild RA, secondary to Sjogren's    CAD (coronary artery disease)    Complication of anesthesia    Depression    history of, treated with med. & therapy   Fibromyalgia    GERD (gastroesophageal reflux disease)    Heart murmur 2010   told by Dr. Mahlon - seen perhaps on ECHO?   Hiatal hernia    History of kidney stones    passed spontaneously   Hyperlipidemia    Hypertension    Iron  deficiency anemia 02/18/2022   PONV (postoperative nausea and vomiting)    mild nausea post op   Rheumatoid arthritis(714.0)    Sjogren's syndrome (HCC)     Past Surgical History:  Procedure Laterality Date   ABDOMINAL HYSTERECTOMY  1996   ANTERIOR CERVICAL DECOMP/DISCECTOMY FUSION N/A 11/02/2017   Procedure: ANTERIOR CERVICAL DECOMPRESSION FUSION, CERVICAL 6-7 WITH INSTRUMENTATION AND ALLOGRAFT;  Surgeon: Beuford Anes, MD;  Location: [facility] OR;  Service:  Orthopedics;  Laterality: N/A;   CARDIAC CATHETERIZATION     HERNIA REPAIR     IR BONE MARROW BIOPSY & ASPIRATION  03/09/2022   LEFT HEART CATH AND CORONARY ANGIOGRAPHY N/A 08/12/2021   Procedure: LEFT HEART CATH AND CORONARY ANGIOGRAPHY;  Surgeon: Claudene Victory ORN, MD;  Location: [facility];  Service: Cardiovascular;  Laterality: N/A;   OOPHORECTOMY     1 ovary remain, removed adhesions on bowel   REDUCTION MAMMAPLASTY Bilateral 11/2009   TONSILLECTOMY     born without tonsils    VESICOVAGINAL FISTULA CLOSURE W/ TAH      Family History  Problem Relation Age of Onset   Coronary artery disease Mother    Hypertension Mother    Diabetes Mother    Heart attack Father    Hypertension Father    CVA Father    Stroke Maternal Grandmother    Heart attack Paternal Grandmother        Grandmother   Breast cancer Maternal Aunt     Social History   Tobacco  Use   Smoking status: Never    Passive exposure: Never   Smokeless tobacco: Never   Tobacco comments:    I never smoked anything because it didn't like the smell of it in especially on people  Vaping Use   Vaping status: Never Used  Substance Use Topics   Alcohol use: No   Drug use: Never     Allergies  Allergen Reactions   Amitriptyline Swelling    Leg swelling   Latex Other (See Comments)    Burns skin   Amlodipine      Muscle cramps, felt dehydrated (at 2.5mg  dose)   Lipitor [Atorvastatin] Itching    Review of Systems NEGATIVE UNLESS OTHERWISE INDICATED IN HPI      Objective:     BP 126/68 (BP Location: Left Arm, Patient Position: Sitting, Cuff Size: Normal)   Pulse 72   Temp (!) 97.3 F (36.3 C) (Temporal)   Ht 5' 4 (1.626 m)   Wt 181 lb 9.6 oz (82.4 kg)   SpO2 98%   BMI 31.17 kg/m   Wt Readings from Last 3 Encounters:  08/23/23 181 lb 9.6 oz (82.4 kg)  03/31/23 176 lb 6.4 oz (80 kg)  01/12/23 188 lb 9.6 oz (85.5 kg)    BP Readings from Last 3 Encounters:  08/23/23 126/68  03/31/23 120/84   01/12/23 117/71     Physical Exam Vitals and nursing note reviewed.  Constitutional:      Appearance: Normal appearance.  Eyes:     Extraocular Movements: Extraocular movements intact.     Conjunctiva/sclera: Conjunctivae normal.     Pupils: Pupils are equal, round, and reactive to light.  Cardiovascular:     Rate and Rhythm: Normal rate and regular rhythm.  Pulmonary:     Effort: Pulmonary effort is normal.     Breath sounds: Normal breath sounds.  Skin:    Findings: Lesion (left upper arm there is a deep, firm, somewhat tender, fairly fixed mass; no surrounding bruising or edema) present.  Neurological:     General: No focal deficit present.     Mental Status: She is alert and oriented to person, place, and time.  Psychiatric:        Mood and Affect: Mood normal.        Behavior: Behavior normal.             Quin Mcpherson M Lukis Bunt, PA-C

## 2023-08-23 NOTE — Patient Instructions (Addendum)
 Please update TDAP at your pharmacy and also complete Shingles vaccine. Let us  know when these are done to update your records.    VISIT SUMMARY: During your visit, we discussed your stress and adjustment issues related to your upcoming retirement and multiple health concerns. We also addressed your subcutaneous masses, recent fall and knee injury, back pain, difficulty swallowing, prediabetes, and vitamin D  deficiency.  YOUR PLAN: SUBCUTANEOUS MASSES OF LEFT UPPER ARM AND LOWER EXTREMITY: You have tender masses in your left upper arm and leg. The mass in your arm is over the tricep area and is non-mobile. -We will order an ultrasound of the soft tissue of your left upper arm to better understand the nature of the mass.  ADJUSTMENT DISORDER WITH MIXED ANXIETY AND DEPRESSED MOOD: You are experiencing anxiety and mood changes related to your upcoming retirement and health concerns. -We will refer you to Dr. Richerd Ling for counseling to help manage your stress and adjustment issues.  HISTORY OF FALL WITH INJURY AND POST-FALL KNEE PAIN: You had a fall on July 23rd that resulted in knee pain. You received an injection for your knee and are awaiting therapy approval for your arm. -Continue to monitor your knee and arm. Report any new falls or worsening symptoms.  BACK PAIN, UNDER TREATMENT: You have chronic back pain that is being treated with injections. Stress and fatigue may be contributing to your symptoms. -Continue with your current treatment plan for back pain. Try to manage stress and get adequate rest.  DYSPHAGIA WITH ESOPHAGEAL WEBBING: You have difficulty swallowing due to esophageal webbing. -You have been referred to a gastroenterologist for further evaluation.  PREDIABETES: Your last A1c was 6.4%, indicating prediabetes. -We will recheck your A1c to monitor for any progression to diabetes.  VITAMIN D  DEFICIENCY, UNDER TREATMENT: You have a vitamin D  deficiency and are  currently taking supplements. -We will recheck your vitamin D  levels to ensure they are improving.                      Contains text generated by [employer].                                 Contains text generated by [employer].

## 2023-08-24 ENCOUNTER — Ambulatory Visit: Payer: Self-pay | Admitting: Physician Assistant

## 2023-08-24 ENCOUNTER — Ambulatory Visit
Admission: RE | Admit: 2023-08-24 | Discharge: 2023-08-24 | Disposition: A | Discharge status: Home / Self Care | Source: Ambulatory Visit | Attending: Physician Assistant | Admitting: Physician Assistant

## 2023-08-24 DIAGNOSIS — R2232 Localized swelling, mass and lump, left upper limb: Secondary | ICD-10-CM

## 2023-09-07 ENCOUNTER — Encounter: Admitting: Physician Assistant

## 2023-09-11 ENCOUNTER — Encounter: Payer: Self-pay | Admitting: Hematology & Oncology

## 2023-09-14 ENCOUNTER — Ambulatory Visit: Admitting: Clinical

## 2023-09-14 DIAGNOSIS — F4322 Adjustment disorder with anxiety: Secondary | ICD-10-CM | POA: Diagnosis not present
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
## 2023-09-29 ENCOUNTER — Ambulatory Visit: Admitting: Clinical

## 2023-09-29 DIAGNOSIS — F4322 Adjustment disorder with anxiety: Secondary | ICD-10-CM | POA: Diagnosis not present

## 2023-09-29 NOTE — Progress Notes (Addendum)
 [facility] Counselor/Therapist Progress Note IN PERSON VISIT   Patient ID: [name], MRN: [id number]    Date: 09/29/2023  Time Spent: 1230 pm   - 1:35pm  : 55 Minutes  Types of Service: Individual psychotherapy  Reported Symptoms: Tired, Stressed  Mental Status Exam: Appearance:  Neat     Behavior: Appropriate  Motor: Normal  Speech/Language:  Normal Rate  Affect: Appropriate  Mood: anxious  Thought process: normal  Thought content:   WNL  Sensory/Perceptual disturbances:   WNL  Orientation: oriented to person, place, time/date, situation, and day of week  Attention: Good  Concentration: Good  Memory: WNL  Fund of knowledge:  Good  Insight:   Good  Judgment:  Good  Impulse Control: Good   Risk Assessment: Danger to Self:  No Self-injurious Behavior: No Danger to Others: No Duty to Warn:no   Subjective:   [name] who prefers to be called [name] presented to be alert and reported feeling better since last visit due to decisions she's made for herself.   Interventions: Reviewed accomplishments, identified strengths & coping strategies she's implemented. Psycho education on setting boundaries for herself.  Client Response: [name] presented to be alert and reported a sense of release after making a decision to retire in January 2026, instead of April 2026.   She also reported that she was so sick on Monday that it affected her work that she completed that day.  Although she was critical of herself, she was able to accept that she doesn't have to be perfect and can make mistakes.  [name] also reported she was able to set a boundary for herself by not answering phone calls from family members when she did not have the energy to talk this week.  [name] reported she's been thinking a lot about how she can pour into herself and starting to change her behaviors has helped, eg not accepting all phone calls she receives, eg letting others start to take on  responsibilities that she would typically take on at work or at home.   Diagnosis:  Adjustment disorder with anxious mood   Goals, Assessment & Plan:   Continue outpatient psycho therapy  Frequency: 2-3 times a month  Modality: individual - in person   Goal: Increase her ability to set healthy boundaries for herself to decrease her stress level as evidenced by self-report.  Target Date: 01/17/2024  Progress: Ongoing     Goal: Enhance coping skills to manage stress and anxiety level as evidenced by self report.  Target Date: 01/17/2024  Progress: Ongoing    [name] P. Trudy, MSW, LCSW [employer] Therapist Main Office: [phone]

## 2023-10-10 ENCOUNTER — Other Ambulatory Visit: Payer: Self-pay | Admitting: Physician Assistant

## 2023-10-13 ENCOUNTER — Ambulatory Visit: Admitting: Clinical

## 2023-10-19 ENCOUNTER — Other Ambulatory Visit: Payer: Self-pay | Admitting: Physician Assistant

## 2023-10-19 DIAGNOSIS — E876 Hypokalemia: Secondary | ICD-10-CM

## 2023-10-19 NOTE — Telephone Encounter (Signed)
 Please advise on refill and if pt is still needing to continue taking Rx

## 2023-10-23 ENCOUNTER — Other Ambulatory Visit: Payer: Self-pay | Admitting: Physician Assistant

## 2023-10-24 NOTE — Telephone Encounter (Signed)
 Called and spoke with patient, reviewing with them plan of care. Patient expressed understanding and will reach out to us  regarding how new dosage is going

## 2023-11-02 ENCOUNTER — Ambulatory Visit: Admitting: Clinical

## 2023-11-02 DIAGNOSIS — F4322 Adjustment disorder with anxiety: Secondary | ICD-10-CM | POA: Diagnosis not present

## 2023-11-02 NOTE — Progress Notes (Signed)
 [facility] Counselor/Therapist Progress Note - IN-PERSON  Patient ID: [name], MRN: [id number]    Date: 11/02/2023  Time Spent: 11:58  - 12:55  : 57 Minutes  Types of Service: Individual psychotherapy  Presenting Concerns:  Starting to transition from full time work into retirement and learning to adjust to that phase of her life  Mental Status Exam: Appearance:  Neat     Behavior: Appropriate  Motor: Normal  Speech/Language:  Normal Rate  Affect: Appropriate  Mood: normal  Thought process: normal  Thought content:   WNL  Sensory/Perceptual disturbances:   WNL  Orientation: oriented to person, place, time/date, situation, and day of week  Attention: Good  Concentration: Good  Memory: WNL  Fund of knowledge:  Good  Insight:   Good  Judgment:  Good  Impulse Control: Good   Risk Assessment: Danger to Self:  No Self-injurious Behavior: No Danger to Others: No Duty to Warn:no   Subjective:  [name] reported that she is thinking about ways to help her as she transitions from working full time to retirement next year.  She is preparing details to ensure she has what she needs to retire and complete her work.   Interventions: Solution-Oriented/Positive Psychology, Identified strengths, accomplishments and solutions to help her through this transition in her life.  Client Response: [name] reported that is continues to identify projects that she can start now since she is use to doing something.  She will work on projects for her house, her cook book project, and doing things for the community.   Diagnosis:  Adjustment disorder with anxious mood   Goals, Assessment & Plan:   [name] reported that she is feeling better since making the decision to retire and managing her stress level. She is setting healthy boundaries for herself and utilizing her coping skills, as well as support system.  [name] reported that she no longer needs psycho therapy at this time.  She  will call if needed in the future.  Frequency: 2-3 times a month  Modality: In-person    Goal: Increase her ability to set healthy boundaries for herself to decrease her stress level as evidenced by self-report. - [name] reported she is improving in setting boundaries for herself   Target Date: 01/17/2024  Progress: Completed      Goal: Enhance coping skills to manage stress and anxiety level as evidenced by self report.  - [name] reported she is feeling a lot better since she's made the decision to retire and preparing for that transition  Target Date: 01/17/2024  Progress: Completed     Allante Beane P. Trudy, MSW, LCSW [employer] Therapist Main Office: [phone]

## 2023-11-21 DIAGNOSIS — M5416 Radiculopathy, lumbar region: Secondary | ICD-10-CM | POA: Insufficient documentation

## 2023-12-04 ENCOUNTER — Telehealth: Payer: Self-pay | Admitting: Physician Assistant

## 2023-12-04 NOTE — Telephone Encounter (Signed)
 [employer] faxed FMLA, to be filled out by provider. Patient requested to send it back via Fax within ASAP. Document is located in providers tray at front office.Please advise at [phone].

## 2023-12-05 NOTE — Telephone Encounter (Signed)
Forms in provider basket for review

## 2023-12-07 NOTE — Telephone Encounter (Signed)
 LVM for Patient to schedule OV before 01/17/24 to discuss FMLA paperwork and updates in order to complete

## 2023-12-07 NOTE — Telephone Encounter (Signed)
 Please contact patient and advise PCP requesting a visit to discuss FMLA paperwork and updates in order to complete. FMLA runs out 01/17/24 and pt not scheduled for follow up until Feb 2026.

## 2024-01-10 ENCOUNTER — Ambulatory Visit (INDEPENDENT_AMBULATORY_CARE_PROVIDER_SITE_OTHER): Admitting: Physician Assistant

## 2024-01-10 ENCOUNTER — Encounter: Payer: Self-pay | Admitting: Physician Assistant

## 2024-01-10 VITALS — BP 130/76 | HR 70 | Temp 97.9°F | Ht 64.0 in | Wt 185.2 lb

## 2024-01-10 DIAGNOSIS — M797 Fibromyalgia: Secondary | ICD-10-CM

## 2024-01-10 DIAGNOSIS — M5416 Radiculopathy, lumbar region: Secondary | ICD-10-CM | POA: Diagnosis not present

## 2024-01-10 DIAGNOSIS — G8929 Other chronic pain: Secondary | ICD-10-CM | POA: Insufficient documentation

## 2024-01-10 DIAGNOSIS — Z23 Encounter for immunization: Secondary | ICD-10-CM

## 2024-01-10 DIAGNOSIS — M62830 Muscle spasm of back: Secondary | ICD-10-CM | POA: Insufficient documentation

## 2024-01-10 NOTE — Progress Notes (Signed)
 "   Patient ID: [name], female    DOB: 1955/09/30, 68 y.o.   MRN: [id number]   Assessment & Plan:  Lumbar radiculopathy  Fibromyalgia  Immunization due -     Flu vaccine HIGH DOSE PF(Fluzone Trivalent)     Assessment & Plan Lumbar radiculopathy Chronic lumbar radiculopathy with intermittent flare-ups causing lower back pain. Previous injections were ineffective. Orthopedic consultation recommended MRI before considering surgical intervention. She prefers to delay surgery and manage symptoms conservatively. - Continue conservative management of symptoms  Fibromyalgia Chronic fibromyalgia contributing to overall discomfort and requiring accommodations for work. Symptoms include flare-ups necessitating rest and a low-stress environment. - Continue current management plan - Provided FMLA accommodations for work from home and rest as needed up to three days per week  General Health Maintenance Received updated flu vaccine today. Discussed the importance of flu prevention and potential use of Tamiflu  for household exposure. - Administered flu vaccine - Advised on Tamiflu  use for household flu exposure      Return in about 4 months (around 05/10/2024) for recheck/follow-up.    Subjective:    Chief Complaint  Patient presents with   Medical Management of Chronic Issues    Pt in office to discuss FMLA paperwork completion to renew for upcoming year; pt needing same accommodations starting 01/18/24-01/16/25     HPI Discussed the use of AI scribe software for clinical note transcription with the patient, who gave verbal consent to proceed.  History of Present Illness [name] is a 68 year old female with lumbar radiculopathy and fibromyalgia who presents for a follow-up visit for FMLA paperwork due to her chronic conditions.  She is seeking accommodations to work from home at least three days a week due to potential flare-ups of her conditions, which may  require her to rest. She needs a low-stress environment with access to a bed when necessary.  She experiences flare-ups of her lumbar radiculopathy and fibromyalgia, which affect her ability to work. She describes episodes where her lower back 'just goes out,' impacting her mobility. She has previously received injections for her back pain, which were ineffective.  She is currently managing her conditions by learning to prioritize her own needs and setting boundaries, both personally and professionally. She has a history of being severely affected by the flu in the past despite vaccination.     Past Medical History:  Diagnosis Date   Allergy    Anemia    history   Anxiety    Aortic atherosclerosis    Arthritis    mild RA, secondary to Sjogren's    CAD (coronary artery disease)    Complication of anesthesia    Depression    history of, treated with med. & therapy   Fibromyalgia    GERD (gastroesophageal reflux disease)    Heart murmur 2010   told by Dr. Mahlon - seen perhaps on ECHO?   Hiatal hernia    History of kidney stones    passed spontaneously   Hyperlipidemia    Hypertension    Iron  deficiency anemia 02/18/2022   PONV (postoperative nausea and vomiting)    mild nausea post op   Rheumatoid arthritis(714.0)    Sjogren's syndrome     Past Surgical History:  Procedure Laterality Date   ABDOMINAL HYSTERECTOMY  1996   ANTERIOR CERVICAL DECOMP/DISCECTOMY FUSION N/A 11/02/2017   Procedure: ANTERIOR CERVICAL DECOMPRESSION FUSION, CERVICAL 6-7 WITH INSTRUMENTATION AND ALLOGRAFT;  Surgeon: Beuford Anes, MD;  Location: [facility] OR;  Service: Orthopedics;  Laterality: N/A;   CARDIAC CATHETERIZATION     HERNIA REPAIR     IR BONE MARROW BIOPSY & ASPIRATION  03/09/2022   LEFT HEART CATH AND CORONARY ANGIOGRAPHY N/A 08/12/2021   Procedure: LEFT HEART CATH AND CORONARY ANGIOGRAPHY;  Surgeon: Claudene Victory ORN, MD;  Location: [facility] INVASIVE CV LAB;  Service: Cardiovascular;  Laterality: N/A;    OOPHORECTOMY     1 ovary remain, removed adhesions on bowel   REDUCTION MAMMAPLASTY Bilateral 11/2009   TONSILLECTOMY     born without tonsils    VESICOVAGINAL FISTULA CLOSURE W/ TAH      Family History  Problem Relation Age of Onset   Coronary artery disease Mother    Hypertension Mother    Diabetes Mother    Heart attack Father    Hypertension Father    CVA Father    Stroke Maternal Grandmother    Heart attack Paternal Grandmother        Grandmother   Breast cancer Maternal Aunt     Social History[1]   Allergies[2]  Review of Systems NEGATIVE UNLESS OTHERWISE INDICATED IN HPI      Objective:     BP 130/76 (BP Location: Left Arm, Patient Position: Sitting, Cuff Size: Normal)   Pulse 70   Temp 97.9 F (36.6 C) (Temporal)   Ht 5' 4 (1.626 m)   Wt 185 lb 3.2 oz (84 kg)   SpO2 99%   BMI 31.79 kg/m   Wt Readings from Last 3 Encounters:  01/10/24 185 lb 3.2 oz (84 kg)  08/23/23 181 lb 9.6 oz (82.4 kg)  03/31/23 176 lb 6.4 oz (80 kg)    BP Readings from Last 3 Encounters:  01/10/24 130/76  08/23/23 126/68  03/31/23 120/84     Physical Exam Vitals and nursing note reviewed.  Constitutional:      Appearance: Normal appearance.  Eyes:     Extraocular Movements: Extraocular movements intact.     Conjunctiva/sclera: Conjunctivae normal.     Pupils: Pupils are equal, round, and reactive to light.  Cardiovascular:     Rate and Rhythm: Normal rate and regular rhythm.  Pulmonary:     Effort: Pulmonary effort is normal.     Breath sounds: Normal breath sounds.  Neurological:     General: No focal deficit present.     Mental Status: She is alert and oriented to person, place, and time.  Psychiatric:        Mood and Affect: Mood normal.        Behavior: Behavior normal.             Erhardt Dada M Raider Valbuena, PA-C    [1]  Social History Tobacco Use   Smoking status: Never    Passive exposure: Never   Smokeless tobacco: Never   Tobacco comments:     I never smoked anything because it didnt like the smell of it in especially on people  Vaping Use   Vaping status: Never Used  Substance Use Topics   Alcohol use: No   Drug use: Never  [2]  Allergies Allergen Reactions   Latex Other (See Comments)    Burns skin   "

## 2024-01-16 ENCOUNTER — Telehealth: Payer: Self-pay | Admitting: Physician Assistant

## 2024-01-16 ENCOUNTER — Ambulatory Visit: Admitting: Physician Assistant

## 2024-01-16 ENCOUNTER — Ambulatory Visit: Admitting: Clinical

## 2024-01-16 NOTE — Telephone Encounter (Signed)
 [facility] faxed healthcare provider certification form, to be filled out by provider. [place] requested to send it back via Fax within ASAP. Document is located in providers tray at front office.Please advise at [phone].

## 2024-01-16 NOTE — Telephone Encounter (Signed)
Forms in provider basket for review

## 2024-01-17 DIAGNOSIS — Z0279 Encounter for issue of other medical certificate: Secondary | ICD-10-CM

## 2024-01-17 NOTE — Telephone Encounter (Signed)
 Forms completed and faxed to [facility] as requested. Green sheet and paperwork returned to front office for charges and to be scanned in pt chart

## 2024-01-24 ENCOUNTER — Encounter: Payer: Self-pay | Admitting: Physician Assistant

## 2024-05-10 ENCOUNTER — Ambulatory Visit: Admitting: Physician Assistant
